# Patient Record
Sex: Male | Born: 1935 | Race: White | Hispanic: No | Marital: Married | Smoking: Former smoker
Health system: Southern US, Community
[De-identification: ages and names within clinical notes are randomized; demographics above are authoritative.]

## PROBLEM LIST (undated history)

## (undated) DIAGNOSIS — I10 Essential (primary) hypertension: Secondary | ICD-10-CM

## (undated) DIAGNOSIS — E119 Type 2 diabetes mellitus without complications: Secondary | ICD-10-CM

## (undated) DIAGNOSIS — I4891 Unspecified atrial fibrillation: Secondary | ICD-10-CM

## (undated) HISTORY — PX: LIPOMA EXCISION: SHX5283

---

## 2017-07-12 DEATH — deceased

## 2019-01-20 ENCOUNTER — Encounter (HOSPITAL_COMMUNITY): Payer: Self-pay

## 2019-01-20 ENCOUNTER — Emergency Department (HOSPITAL_COMMUNITY)
Admission: EM | Admit: 2019-01-20 | Discharge: 2019-01-20 | Disposition: A | Discharge status: Home / Self Care | Payer: Medicare HMO | Attending: Emergency Medicine | Admitting: Emergency Medicine

## 2019-01-20 ENCOUNTER — Other Ambulatory Visit: Payer: Self-pay

## 2019-01-20 ENCOUNTER — Emergency Department (HOSPITAL_COMMUNITY): Payer: Medicare HMO

## 2019-01-20 DIAGNOSIS — I10 Essential (primary) hypertension: Secondary | ICD-10-CM | POA: Insufficient documentation

## 2019-01-20 DIAGNOSIS — R079 Chest pain, unspecified: Secondary | ICD-10-CM | POA: Diagnosis present

## 2019-01-20 DIAGNOSIS — I9589 Other hypotension: Secondary | ICD-10-CM | POA: Insufficient documentation

## 2019-01-20 HISTORY — DX: Unspecified atrial fibrillation: I48.91

## 2019-01-20 HISTORY — DX: Essential (primary) hypertension: I10

## 2019-01-20 LAB — CBC
HCT: 44.6 % (ref 39.0–52.0)
Hemoglobin: 15.3 g/dL (ref 13.0–17.0)
MCH: 31 pg (ref 26.0–34.0)
MCHC: 34.3 g/dL (ref 30.0–36.0)
MCV: 90.5 fL (ref 80.0–100.0)
Platelets: 260 10*3/uL (ref 150–400)
RBC: 4.93 MIL/uL (ref 4.22–5.81)
RDW: 12.8 % (ref 11.5–15.5)
WBC: 12.4 10*3/uL — ABNORMAL HIGH (ref 4.0–10.5)
nRBC: 0 % (ref 0.0–0.2)

## 2019-01-20 LAB — BASIC METABOLIC PANEL
Anion gap: 9 (ref 5–15)
BUN: 15 mg/dL (ref 8–23)
CO2: 26 mmol/L (ref 22–32)
Calcium: 9 mg/dL (ref 8.9–10.3)
Chloride: 100 mmol/L (ref 98–111)
Creatinine, Ser: 1.27 mg/dL — ABNORMAL HIGH (ref 0.61–1.24)
GFR calc Af Amer: >60 mL/min (ref >60)
GFR calc non Af Amer: 52 mL/min — ABNORMAL LOW (ref >60)
Glucose, Bld: 226 mg/dL — ABNORMAL HIGH (ref 70–99)
Potassium: 4.5 mmol/L (ref 3.5–5.1)
Sodium: 135 mmol/L (ref 135–145)

## 2019-01-20 LAB — TROPONIN I (HIGH SENSITIVITY): Troponin I (High Sensitivity): <2 ng/L (ref <18)

## 2019-01-20 LAB — I-STAT CHEM 8, ED
BUN: 16 mg/dL (ref 8–23)
Calcium, Ion: 1.13 mmol/L — ABNORMAL LOW (ref 1.15–1.40)
Chloride: 100 mmol/L (ref 98–111)
Creatinine, Ser: 1.1 mg/dL (ref 0.61–1.24)
Glucose, Bld: 224 mg/dL — ABNORMAL HIGH (ref 70–99)
HCT: 42 % (ref 39.0–52.0)
Hemoglobin: 14.3 g/dL (ref 13.0–17.0)
Potassium: 4.4 mmol/L (ref 3.5–5.1)
Sodium: 135 mmol/L (ref 135–145)
TCO2: 25 mmol/L (ref 22–32)

## 2019-01-20 MED ORDER — SODIUM CHLORIDE 0.9 % IV BOLUS
1000.0000 mL | Freq: Once | INTRAVENOUS | Status: AC
  Administered 2019-01-20: 1000 mL via INTRAVENOUS

## 2019-01-20 MED ORDER — IOHEXOL 350 MG/ML SOLN
100.0000 mL | Freq: Once | INTRAVENOUS | Status: AC | PRN
  Administered 2019-01-20: 19:00:00 100 mL via INTRAVENOUS

## 2019-01-20 NOTE — ED Triage Notes (Signed)
Pt BIB [facility] for eval of blt rib pain onset 1400, pt describes it as a band of pain around the ribs. Pt reports 2 episodes of vomiting. Went to UC, rec'd 324 ASA there, rec'd 3 SL NTG, 168mcg fentanyl and 4 mg zofran by EMS. RBBB on 12 lead.  Reports pain is 1/10 on arrival. Pt is markedly hypertensive on arrival 239/204.

## 2019-01-20 NOTE — ED Provider Notes (Signed)
[facility] EMERGENCY DEPARTMENT Provider Note   CSN: [id number] Arrival date & time: 01/20/19  1805     History   Chief Complaint Chief Complaint  Patient presents with   Chest Pain    HPI Dennis Stephens is a 85 y.o. male presenting from urgent care for chest pain.  Please see ED course below for immediate history obtained from patient.  He was rapidly triaged back to the main ED based on stable vital signs.  I was able to ascertain he does have a history of MI and hypertension.  He states he is on Coreg.  He denies using any blood thinners but does state that he has a history of atrial fibrillation.  He reports he had chest pain that began this morning, likely insidious onset, and he feels it in the bilateral lower ribs.  He denies any pain in his back.  He denies any shortness of breath.  He denies that this feels of his prior MIs.  He was given aspirin and nitroglycerin by EMS while in transport from urgent care to our emergency department.  He was nitro extremely hypertensive in urgent care, but after the nitroglycerin on arrival here, he had a blood pressure discrepancy in your arms, with the right arm significantly elevated compared to left arm.  He told me he felt extremely tired and lightheaded.  NKDA Lives with his wife     HPI  Past Medical History:  Diagnosis Date   A-fib [facility])    Hypertension     There are no problems to display for this patient.   History reviewed. No pertinent surgical history.      Home Medications    Prior to Admission medications   Not on File    Family History History reviewed. No pertinent family history.  Social History Social History   Tobacco Use   Smoking status: Not on file  Substance Use Topics   Alcohol use: Not on file   Drug use: Not on file     Allergies   Patient has no known allergies.   Review of Systems Review of Systems  Constitutional: Positive for chills and fatigue.  Negative for fever.  Eyes: Negative for photophobia and visual disturbance.  Respiratory: Negative for cough and shortness of breath.   Cardiovascular: Negative for chest pain, palpitations and leg swelling.  Gastrointestinal: Positive for nausea. Negative for abdominal pain and vomiting.  Musculoskeletal: Negative for arthralgias and myalgias.  Skin: Positive for pallor. Negative for rash.  Neurological: Negative for syncope and light-headedness.  All other systems reviewed and are negative.    Physical Exam Updated Vital Signs BP (!) 186/72    Pulse 72    Temp 98.1 F (36.7 C) (Oral)    Resp 15    Ht 5\' 6"  (1.676 m)    Wt 95.3 kg    SpO2 96%    BMI 33.89 kg/m   Physical Exam Vitals signs and nursing note reviewed.  Constitutional:      Appearance: He is well-developed. He is ill-appearing and diaphoretic.  HENT:     Head: Normocephalic and atraumatic.  Eyes:     Conjunctiva/sclera: Conjunctivae normal.  Neck:     Musculoskeletal: Neck supple.  Cardiovascular:     Rate and Rhythm: Normal rate and regular rhythm.     Heart sounds: No murmur.     Comments: Equal pulses bilaterally Pulmonary:     Effort: Pulmonary effort is normal. No respiratory distress.  Breath sounds: Normal breath sounds.  Abdominal:     General: There is no distension or abdominal bruit.     Palpations: Abdomen is soft. There is no pulsatile mass.     Tenderness: There is no abdominal tenderness. Negative signs include Murphy's sign.  Skin:    General: Skin is warm.  Neurological:     General: No focal deficit present.     Mental Status: He is alert and oriented to person, place, and time.      ED Treatments / Results  Labs (all labs ordered are listed, but only abnormal results are displayed) Labs Reviewed  BASIC METABOLIC PANEL - Abnormal; Notable for the following components:      Result Value   Glucose, Bld 226 (*)    Creatinine, Ser 1.27 (*)    GFR calc non Af Amer 52 (*)    All  other components within normal limits  CBC - Abnormal; Notable for the following components:   WBC 12.4 (*)    All other components within normal limits  I-STAT CHEM 8, ED - Abnormal; Notable for the following components:   Glucose, Bld 224 (*)    Calcium, Ion 1.13 (*)    All other components within normal limits  TROPONIN I (HIGH SENSITIVITY)    EKG EKG Interpretation  Date/Time:  Wednesday January 20 2019 18:06:55 EST Ventricular Rate:  75 PR Interval:  172 QRS Duration: 142 QT Interval:  450 QTC Calculation: 502 R Axis:   -100 Text Interpretation: Normal sinus rhythm Right bundle branch block Inferior infarct , age undetermined Abnormal ECG No STEMI Confirmed by Octaviano Glow [id number]) on 01/20/2019 9:16:35 PM   Radiology CT Angio Chest/Abd/Pel for Dissection W and/or Wo Contrast  Result Date: 01/20/2019 CLINICAL DATA:  Chest pain. Abdominal aortic aneurysm. EXAM: CT ANGIOGRAPHY CHEST, ABDOMEN AND PELVIS TECHNIQUE: Multidetector CT imaging through the chest, abdomen and pelvis was performed using the standard protocol during bolus administration of intravenous contrast. Multiplanar reconstructed images and MIPs were obtained and reviewed to evaluate the vascular anatomy. CONTRAST:  129mL OMNIPAQUE IOHEXOL 350 MG/ML SOLN COMPARISON:  None. FINDINGS: CTA CHEST FINDINGS Cardiovascular: --Heart: The heart size is normal. There is nopericardial effusion. There are coronary artery calcifications. --Aorta: The course and caliber of the thoracic aorta are normal. There is moderate aortic atherosclerotic calcification. Precontrast images show no aortic intramural hematoma. There is no blood pool, dissection or penetrating ulcer demonstrated on arterial phase postcontrast imaging. The aortic arch is excluded from view on the postcontrast images. There is a conventional 3 vessel aortic arch branching pattern. --Pulmonary Arteries: Contrast timing is optimized for preferential opacification of the  aorta. Within that limitation, normal central pulmonary arteries. Mediastinum/Nodes: No mediastinal, hilar or axillary lymphadenopathy. The visualized thyroid and thoracic esophageal course are unremarkable. Lungs/Pleura: There is a 1.9 x 1.8 cm nodule in the left upper lobe. In the right upper lobe there is a 8 mm nodule. No pleural effusion. Musculoskeletal: No chest wall abnormality. No acute osseous findings. Review of the MIP images confirms the above findings. CTA ABDOMEN AND PELVIS FINDINGS VASCULAR Aorta: Normal caliber aorta without aneurysm, dissection, vasculitis or hemodynamically significant stenosis. There is moderate aortic atherosclerosis. Celiac: Patent without evidence of aneurysm, dissection, vasculitis or significant stenosis. SMA: Replaced right hepatic artery arises from the SMA right hepatic branch is severely stenotic at its origin (series 12, image 87. Renals: Single renal arteries bilaterally. No aneurysm, dissection, stenosis or evidence of fibromuscular dysplasia. IMA: Patent without abnormality. Inflow:  Patent without evidence of aneurysm, dissection, vasculitis or significant stenosis. There is calcific atherosclerosis. Veins: Normal course and caliber of the major veins. Assessment is otherwise limited by the arterial dominant contrast phase. Review of the MIP images confirms the above findings. NON-VASCULAR Hepatobiliary: Normal hepatic contours and density. No visible biliary dilatation. Normal gallbladder. Pancreas: Normal contours without ductal dilatation. No peripancreatic fluid collection. Spleen: Normal arterial phase splenic enhancement pattern. Adrenals/Urinary Tract: --Adrenal glands: Normal. --Right kidney/ureter: There is a 4 cm exophytic right renal cyst. No hydronephrosis. --Left kidney/ureter: No hydronephrosis or perinephric stranding. No nephrolithiasis. No obstructing ureteral stones. --Urinary bladder: Unremarkable. Stomach/Bowel: --Stomach/Duodenum: No hiatal  hernia or other gastric abnormality. Normal duodenal course and caliber. --Small bowel: No dilatation or inflammation. --Colon: Rectosigmoid diverticulosis without acute inflammation. --Appendix: Normal. Lymphatic: No abdominal or pelvic lymphadenopathy. Reproductive: Normal prostate and seminal vesicles. Musculoskeletal. No bony spinal canal stenosis or focal osseous abnormality. Other: None. Review of the MIP images confirms the above findings. IMPRESSION: 1. Coronary artery and aortic atherosclerosis (ICD10-I70.0) without acute aortic syndrome or aortic aneurysm. The aortic arch was not included in the field of view of the arterial phase images, reportedly due to scanner error. 2. Bilateral upper lobe pulmonary nodules, measuring up to 1.9 x 1.8 cm on the left and 8 mm on the right. The left nodule is particularly concerning for carcinoma. Consider one of the following in 3 months for both low-risk and high-risk individuals: (a) repeat chest CT, (b) follow-up PET-CT, or (c) tissue sampling. This recommendation follows the consensus statement: Guidelines for Management of Incidental Pulmonary Nodules Detected on CT Images: From the Fleischner Society 2017; Radiology 2017; 284:228-243. 3. Rectosigmoid diverticulosis without acute inflammation. Electronically Signed   By: Ulyses Jarred M.D.   On: 01/20/2019 19:51    Procedures Procedures (including critical care time)  Medications Ordered in ED Medications  iohexol (OMNIPAQUE) 350 MG/ML injection 100 mL (100 mLs Intravenous Contrast Given 01/20/19 1835)  sodium chloride 0.9 % bolus 1,000 mL (0 mLs Intravenous Stopped 01/20/19 2109)     Initial Impression / Assessment and Plan / ED Course  I have reviewed the triage vital signs and the nursing notes.  Pertinent labs & imaging results that were available during my care of the patient were reviewed by me and considered in my medical decision making (see chart for details).   85 yo male here with  hypotension, diaphoresis, reportedly uneven BP in bilateral arms with right arm > 786 systolic and left arm 80 systolic.  With bilateral chest pain onset today.  Most concerning for dissection vs cardiac event (MI?)   Less likely PE with no dyspnea, but remains on differential  Less likely sepsis given timeline of events and acuity  Possibly hypotensive related to nitroglycerin, will give IVF, reassess, monitor closely    Clinical Course as of Jan 21 1332  Wed Jan 20, 2019  1831 I was summoned to triage by the nursing staff with concern for possible dissection.  The patient apparently was referred to the ED from urgent care center after being found extremely hypertensive with a blood pressure of 767 systolic at the time, complaining of bilateral chest pain that began this morning.  EMS gave him 3 nitroglycerin in route and his chest pain improved from a 10 out of 10 to a 2 out of 10.  However on arrival nursing staff notes that he had unequal blood pressures, with the right arm substantially larger than the left arm.  He subsequently become  hypotensive.  Patient ports he feels extremely tired and appears pale.  It is unclear if the sudden hypotension was related to the nitroglycerin.  We brought him back urgently to the CT scanner for stat CT dissection rule out.  He is on the table for his scan now.   [MT]  4098 He reports a history of MI in the past.  He also reports a history of atrial fibrillation.  He denies any is on any blood thinning medicine.  He denies any smoking history.  He denies any known history of aneurysm.  He reports his symptoms all began today.   [MT]  1955 No acute dissection   [MT]  2000 Asymptomatic now, BP 147/80, wife at bedside.  He tells me he has had a well blood pressure swings for approximately 10 years now.  Has been on the same Coreg dose for approximately 2 years.  This is pressure may climb to 200 and then drop down to 70 randomly during the daytime.  He believes  it may be related to hydration status.   [MT]  2001 Given that there is no acute dissection, troponin is negative, I have a lower suspicion for acute coronary event.  We will give a liter of fluid and reassess in an hour.     [MT]  2130 Patient still asymptomatic and ambulating.  His blood pressure been stable in fact rising slowly over the past hour.  He is due for his evening Coreg, which I believe he can take at home.  Believe it is reasonable to discharge him at this time.  I strongly suspect that his hypotension was related to the four (4)) doses of nitroglycerin he received rapidly en route to the ED.  His BP is equal in both arms now, no deficits, it is posisble the triage BP was a discrepancy related to positioning, etc.   [MT]    Clinical Course User Index [MT] Tinia Oravec, Carola Rhine, MD    This note was dictated using dragon dictation software.  Please be aware that there may be minor translation errors as a result of this oral dictation   Final Clinical Impressions(s) / ED Diagnoses   Final diagnoses:  Chest pain, unspecified type  Other specified hypotension    ED Discharge Orders    None       Wyvonnia Dusky, MD 01/21/19 1334

## 2019-01-20 NOTE — ED Notes (Signed)
Patient verbalizes understanding of discharge instructions. Opportunity for questioning and answers were provided. Armband removed by staff, pt discharged from ED. Pt. ambulatory and discharged home.  

## 2019-01-20 NOTE — Discharge Instructions (Addendum)
You were seen in our emergency department with chest pain.  You had a work-up including blood tests and a CT scan.  I printed a copy of your blood test results and your CAT scan to bring with you to your next appointment at the [facility] hospital.  Your Covid test today was negative.  If you have any recurring symptoms, including lightheadedness, worsening chest pain, difficulty breathing, shortness of breath, you should return to the emergency department.  Please note, your blood pressure did drop while you are in the ER.  It improved on its own.  I strongly recommended that you discuss this with your primary care doctor, or whoever prescribes your carvedilol (coreg), which is a medicine that can slow your heart and drop your heart rate at times.

## 2019-04-20 ENCOUNTER — Emergency Department (HOSPITAL_COMMUNITY): Payer: No Typology Code available for payment source

## 2019-04-20 ENCOUNTER — Inpatient Hospital Stay (HOSPITAL_COMMUNITY)
Admission: EM | Admit: 2019-04-20 | Discharge: 2019-04-22 | DRG: 176 | Disposition: A | Discharge status: Home / Self Care | Payer: No Typology Code available for payment source | Attending: Internal Medicine | Admitting: Internal Medicine

## 2019-04-20 ENCOUNTER — Other Ambulatory Visit: Payer: Self-pay

## 2019-04-20 ENCOUNTER — Encounter (HOSPITAL_COMMUNITY): Payer: Self-pay | Admitting: Emergency Medicine

## 2019-04-20 DIAGNOSIS — E785 Hyperlipidemia, unspecified: Secondary | ICD-10-CM | POA: Diagnosis present

## 2019-04-20 DIAGNOSIS — Z6831 Body mass index (BMI) 31.0-31.9, adult: Secondary | ICD-10-CM

## 2019-04-20 DIAGNOSIS — C3412 Malignant neoplasm of upper lobe, left bronchus or lung: Secondary | ICD-10-CM | POA: Diagnosis present

## 2019-04-20 DIAGNOSIS — R7989 Other specified abnormal findings of blood chemistry: Secondary | ICD-10-CM | POA: Diagnosis present

## 2019-04-20 DIAGNOSIS — N400 Enlarged prostate without lower urinary tract symptoms: Secondary | ICD-10-CM | POA: Diagnosis present

## 2019-04-20 DIAGNOSIS — Z66 Do not resuscitate: Secondary | ICD-10-CM | POA: Diagnosis present

## 2019-04-20 DIAGNOSIS — E1165 Type 2 diabetes mellitus with hyperglycemia: Secondary | ICD-10-CM | POA: Diagnosis present

## 2019-04-20 DIAGNOSIS — I452 Bifascicular block: Secondary | ICD-10-CM | POA: Diagnosis present

## 2019-04-20 DIAGNOSIS — R079 Chest pain, unspecified: Secondary | ICD-10-CM

## 2019-04-20 DIAGNOSIS — N179 Acute kidney failure, unspecified: Secondary | ICD-10-CM | POA: Diagnosis present

## 2019-04-20 DIAGNOSIS — I10 Essential (primary) hypertension: Secondary | ICD-10-CM | POA: Diagnosis present

## 2019-04-20 DIAGNOSIS — R911 Solitary pulmonary nodule: Secondary | ICD-10-CM

## 2019-04-20 DIAGNOSIS — E871 Hypo-osmolality and hyponatremia: Secondary | ICD-10-CM | POA: Diagnosis not present

## 2019-04-20 DIAGNOSIS — I2699 Other pulmonary embolism without acute cor pulmonale: Secondary | ICD-10-CM | POA: Diagnosis not present

## 2019-04-20 DIAGNOSIS — C3411 Malignant neoplasm of upper lobe, right bronchus or lung: Secondary | ICD-10-CM | POA: Diagnosis present

## 2019-04-20 DIAGNOSIS — C799 Secondary malignant neoplasm of unspecified site: Secondary | ICD-10-CM

## 2019-04-20 DIAGNOSIS — C787 Secondary malignant neoplasm of liver and intrahepatic bile duct: Secondary | ICD-10-CM | POA: Diagnosis present

## 2019-04-20 DIAGNOSIS — C771 Secondary and unspecified malignant neoplasm of intrathoracic lymph nodes: Secondary | ICD-10-CM | POA: Diagnosis present

## 2019-04-20 DIAGNOSIS — N183 Chronic kidney disease, stage 3 unspecified: Secondary | ICD-10-CM | POA: Diagnosis present

## 2019-04-20 DIAGNOSIS — I129 Hypertensive chronic kidney disease with stage 1 through stage 4 chronic kidney disease, or unspecified chronic kidney disease: Secondary | ICD-10-CM | POA: Diagnosis present

## 2019-04-20 DIAGNOSIS — I4891 Unspecified atrial fibrillation: Secondary | ICD-10-CM | POA: Diagnosis present

## 2019-04-20 DIAGNOSIS — E669 Obesity, unspecified: Secondary | ICD-10-CM | POA: Diagnosis present

## 2019-04-20 DIAGNOSIS — Z20822 Contact with and (suspected) exposure to covid-19: Secondary | ICD-10-CM | POA: Diagnosis present

## 2019-04-20 DIAGNOSIS — R918 Other nonspecific abnormal finding of lung field: Secondary | ICD-10-CM | POA: Diagnosis present

## 2019-04-20 DIAGNOSIS — Z87891 Personal history of nicotine dependence: Secondary | ICD-10-CM

## 2019-04-20 DIAGNOSIS — E1122 Type 2 diabetes mellitus with diabetic chronic kidney disease: Secondary | ICD-10-CM | POA: Diagnosis present

## 2019-04-20 LAB — COMPREHENSIVE METABOLIC PANEL
ALT: 155 U/L — ABNORMAL HIGH (ref 0–44)
AST: 216 U/L — ABNORMAL HIGH (ref 15–41)
Albumin: 3.7 g/dL (ref 3.5–5.0)
Alkaline Phosphatase: 468 U/L — ABNORMAL HIGH (ref 38–126)
Anion gap: 14 (ref 5–15)
BUN: 20 mg/dL (ref 8–23)
CO2: 22 mmol/L (ref 22–32)
Calcium: 9.3 mg/dL (ref 8.9–10.3)
Chloride: 95 mmol/L — ABNORMAL LOW (ref 98–111)
Creatinine, Ser: 1.34 mg/dL — ABNORMAL HIGH (ref 0.61–1.24)
GFR calc Af Amer: 56 mL/min — ABNORMAL LOW (ref >60)
GFR calc non Af Amer: 49 mL/min — ABNORMAL LOW (ref >60)
Glucose, Bld: 357 mg/dL — ABNORMAL HIGH (ref 70–99)
Potassium: 4.4 mmol/L (ref 3.5–5.1)
Sodium: 131 mmol/L — ABNORMAL LOW (ref 135–145)
Total Bilirubin: 4.3 mg/dL — ABNORMAL HIGH (ref 0.3–1.2)
Total Protein: 7.6 g/dL (ref 6.5–8.1)

## 2019-04-20 LAB — LIPASE, BLOOD: Lipase: 39 U/L (ref 11–51)

## 2019-04-20 LAB — CBC
HCT: 46.3 % (ref 39.0–52.0)
Hemoglobin: 15.3 g/dL (ref 13.0–17.0)
MCH: 30.8 pg (ref 26.0–34.0)
MCHC: 33 g/dL (ref 30.0–36.0)
MCV: 93.3 fL (ref 80.0–100.0)
Platelets: 287 10*3/uL (ref 150–400)
RBC: 4.96 MIL/uL (ref 4.22–5.81)
RDW: 13.6 % (ref 11.5–15.5)
WBC: 14.9 10*3/uL — ABNORMAL HIGH (ref 4.0–10.5)
nRBC: 0 % (ref 0.0–0.2)

## 2019-04-20 LAB — TROPONIN I (HIGH SENSITIVITY): Troponin I (High Sensitivity): 5 ng/L (ref <18)

## 2019-04-20 MED ORDER — SODIUM CHLORIDE (PF) 0.9 % IJ SOLN
INTRAMUSCULAR | Status: AC
  Filled 2019-04-20: qty 50

## 2019-04-20 MED ORDER — IOHEXOL 350 MG/ML SOLN
100.0000 mL | Freq: Once | INTRAVENOUS | Status: AC | PRN
  Administered 2019-04-20: 100 mL via INTRAVENOUS

## 2019-04-20 MED ORDER — MORPHINE SULFATE (PF) 4 MG/ML IV SOLN
4.0000 mg | Freq: Once | INTRAVENOUS | Status: AC
  Administered 2019-04-20: 4 mg via INTRAVENOUS
  Filled 2019-04-20: qty 1

## 2019-04-20 MED ORDER — ONDANSETRON HCL 4 MG/2ML IJ SOLN
4.0000 mg | Freq: Once | INTRAMUSCULAR | Status: AC
  Administered 2019-04-20: 4 mg via INTRAVENOUS
  Filled 2019-04-20: qty 2

## 2019-04-20 MED ORDER — SODIUM CHLORIDE 0.9% FLUSH: 3.0000 mL | Freq: Once | INTRAVENOUS | Status: DC

## 2019-04-20 MED ORDER — SODIUM CHLORIDE 0.9 % IV BOLUS
1000.0000 mL | Freq: Once | INTRAVENOUS | Status: AC
  Administered 2019-04-20: 1000 mL via INTRAVENOUS

## 2019-04-20 NOTE — ED Triage Notes (Signed)
Patient is complaining of chest pain. Patient states it started 3pm. Patient states that he has no other symptoms.

## 2019-04-20 NOTE — ED Provider Notes (Signed)
[name]   CSN: [id number] Arrival date & time: 04/20/19  2116     History Chief Complaint  Patient presents with  . Chest Pain    Dennis Stephens is a 86 y.o. male with PMH/ Afib (not on anticoagulation), HTN who presents for evaluation of chest pain that began about 3-4 PM.  He reports that he was sitting watching TV when the chest pain started.  He describes it as a pressure and a tightness across his anterior chest.  It does not radiate anywhere.  He states it is not worse with deep inspiration.  He does not recall if it is worse with exertion.  He states initially, he did not get diaphoretic or nausea/vomiting but when he got to the waiting room, he started getting diaphoretic and had episodes of vomiting.  Patient also reports he has some upper abdominal pain.  He states that he has had some trouble breathing and some coughing which he states is been an ongoing issue for the last several months.  He saw his primary care doctor today for evaluation of previously seen pulmonary nodules on a CT scan as well as blood pressure.  He states that despite taking his medications, his blood pressure still elevated with systolic blood pressure in the 190s.  He states that his doctor called him today and was told that he would need to come back in next week.  He states that there was some suspicious findings on the CT scan but does not know specifically what that is.  He states that he does not smoke.  He does have a history of high blood pressure.  No history of diabetes.  History of A. fib but not currently on anticoagulation.  He tells me he had an MI about a year and a half ago down in [place] where he had a catheterization.  He does not think he had any stents.  He states he had followed up with cardiology in [place] but has not followed up with anybody here.  He currently sees the [place] for his primary care.  He denies any fevers, urinary complaints,  numbness/weakness of his extremities.  The history is provided by the patient.       Past Medical History:  Diagnosis Date  . A-fib ([place])   . Hypertension     Patient Active Problem List   Diagnosis Date Noted  . Pulmonary embolism ([place]) 04/21/2019    History reviewed. No pertinent surgical history.     Family History  Problem Relation Age of Onset  . Diabetes Mellitus II Neg Hx     Social History   Tobacco Use  . Smoking status: Former Research scientist (life sciences)  . Smokeless tobacco: Never Used  Substance Use Topics  . Alcohol use: Not Currently  . Drug use: Not Currently    Home Medications Prior to Admission medications   Medication Sig Start Date End Date Taking? Authorizing Provider  amLODipine (NORVASC) 2.5 MG tablet Take 2.5 mg by mouth daily.   Yes [provider]  atorvastatin (LIPITOR) 40 MG tablet Take 40 mg by mouth daily.   Yes [provider]  carvedilol (COREG) 12.5 MG tablet Take 12.5 mg by mouth 2 (two) times daily with a meal.   Yes [provider]  doxazosin (CARDURA) 8 MG tablet Take 8 mg by mouth daily.   Yes [provider]  losartan (COZAAR) 100 MG tablet Take 100 mg by mouth daily.   Yes  [provider]  omeprazole (PRILOSEC) 20 MG capsule Take 20 mg by mouth daily.   Yes [provider]  primidone (MYSOLINE) 50 MG tablet Take 100 mg by mouth every morning.   Yes [provider]  sertraline (ZOLOFT) 100 MG tablet Take 100 mg by mouth daily.   Yes [provider]  temazepam (RESTORIL) 30 MG capsule Take 30 mg by mouth at bedtime as needed for sleep.   Yes [provider]    Allergies    Patient has no known allergies.  Review of Systems   Review of Systems  Constitutional: Negative for fever.  Respiratory: Positive for cough and shortness of breath.   Cardiovascular: Positive for chest pain.  Gastrointestinal: Positive for abdominal pain and vomiting. Negative for nausea.    Genitourinary: Negative for dysuria and hematuria.  Neurological: Negative for headaches.  All other systems reviewed and are negative.   Physical Exam Updated Vital Signs BP (!) 164/62   Pulse (!) 101   Temp 98.1 F (36.7 C) (Oral)   Resp (!) 25   Ht '5\' 9"'$  (1.753 m)   Wt 96.6 kg   SpO2 90%   BMI 31.45 kg/m   Physical Exam Vitals and nursing note reviewed.  Constitutional:      Appearance: Normal appearance.     Comments: Appears uncomfortable  HENT:     Head: Normocephalic and atraumatic.  Eyes:     General: Lids are normal.     Conjunctiva/sclera: Conjunctivae normal.     Pupils: Pupils are equal, round, and reactive to light.  Cardiovascular:     Rate and Rhythm: Normal rate and regular rhythm.     Pulses: Normal pulses.          Radial pulses are 2+ on the right side and 2+ on the left side.       Dorsalis pedis pulses are 2+ on the right side and 2+ on the left side.     Heart sounds: Normal heart sounds. No murmur. No friction rub. No gallop.   Pulmonary:     Effort: Pulmonary effort is normal. Tachypnea present.     Breath sounds: Normal breath sounds.     Comments: Slight tachypnea noted. No evidence of respiratory distress.  Mild rales noted to bilateral bases. Abdominal:     Palpations: Abdomen is soft. Abdomen is not rigid.     Tenderness: There is abdominal tenderness in the epigastric area. There is no guarding.     Comments: Abdomen is soft.  Tenderness noted to epigastric region.  Some voluntary guarding noted.  No rigidity.  Musculoskeletal:        General: Normal range of motion.     Cervical back: Full passive range of motion without pain.  Skin:    General: Skin is warm and dry.     Capillary Refill: Capillary refill takes less than 2 seconds.  Neurological:     Mental Status: He is alert and oriented to person, place, and time.  Psychiatric:        Speech: Speech normal.     ED Results / Procedures / Treatments   Labs (all labs ordered  are listed, but only abnormal results are displayed) Labs Reviewed  CBC - Abnormal; Notable for the following components:      Result Value   WBC 14.9 (*)    All other components within normal limits  COMPREHENSIVE METABOLIC PANEL - Abnormal; Notable for the following components:   Sodium 131 (*)  Chloride 95 (*)    Glucose, Bld 357 (*)    Creatinine, Ser 1.34 (*)    AST 216 (*)    ALT 155 (*)    Alkaline Phosphatase 468 (*)    Total Bilirubin 4.3 (*)    GFR calc non Af Amer 49 (*)    GFR calc Af Amer 56 (*)    All other components within normal limits  LIPASE, BLOOD  CBC  HEPARIN LEVEL (UNFRACTIONATED)  POC SARS CORONAVIRUS 2 AG -  ED  TROPONIN I (HIGH SENSITIVITY)  TROPONIN I (HIGH SENSITIVITY)    EKG EKG Interpretation  Date/Time:  Tuesday April 20 2019 21:28:39 EST Ventricular Rate:  82 PR Interval:    QRS Duration: 162 QT Interval:  447 QTC Calculation: 506 R Axis:   -98 Text Interpretation: Sinus rhythm Multiple ventricular premature complexes Consider left atrial enlargement Right bundle branch block No significant change was found Confirmed by Gerlene Fee [phone]) on 04/20/2019 10:09:38 PM   Radiology DG Chest 2 View  Result Date: 04/20/2019 CLINICAL DATA:  Chest pain for several hours, history of lung carcinoma EXAM: CHEST - 2 VIEW COMPARISON:  01/20/2019 CT FINDINGS: Cardiac shadow is within normal limits. There is a rounded nodular density identified in the left lung measuring 3.3 cm which corresponds to that seen on the prior exam at which time it measured 1.8 cm. Patchy changes are noted in the right upper lobe also increased when compared with the prior exam. No sizable effusion is noted. No bony abnormality is noted. IMPRESSION: Enlarging bilateral upper lobe nodules as described. This is consistent with the patient's given clinical history Electronically Signed   By: Inez Catalina M.D.   On: 04/20/2019 21:56   CT Head Wo Contrast  Result Date:  04/21/2019 CLINICAL DATA:  86 year old male with pulmonary emboli, malignant appearing mediastinal lymphadenopathy and liver lesion on CT yesterday. Query brain Mets. EXAM: CT HEAD WITHOUT CONTRAST TECHNIQUE: Contiguous axial images were obtained from the base of the skull through the vertex without intravenous contrast. COMPARISON:  No prior head imaging. FINDINGS: Brain: IV contrast was administered for chest abdomen and pelvis CT 1 hour before these images. Patchy bilateral white matter hypodensity, but no areas suspicious for vasogenic edema. No midline shift, mass effect, or evidence of intracranial mass lesion. No abnormal enhancement identified. No ventriculomegaly. No cortical encephalomalacia identified. No cortically based acute infarct identified. There is a small hypodensity in the posterior right cerebellum on series 2, image 7, probably a small chronic cerebellar infarct. Vascular: Some residual intravascular contrast is present as above. The major intracranial vascular structures seem to be enhancing as expected. Skull: No acute or suspicious osseous lesion identified. Sinuses/Orbits: Visualized paranasal sinuses and mastoids are clear. Other: No acute orbit or scalp soft tissue findings. IMPRESSION: 1. No metastatic disease to the brain is identified by CT with very delayed contrast timing. MRI Head without and with contrast would be most sensitive. 2. No acute intracranial abnormality identified. 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 Electronically Signed   By: Genevie Ann M.D.   On: 04/21/2019 01:08   CT Angio Chest PE W and/or Wo Contrast  Result Date: 04/21/2019 CLINICAL DATA:  Non-specific chest pain and abdominal pain since 1500 hours. History of hypertension and atrial fibrillation. EXAM: CT ANGIOGRAPHY CHEST CT ABDOMEN AND PELVIS WITH CONTRAST TECHNIQUE: Multidetector CT imaging of the chest was performed using the  standard protocol during bolus administration  of intravenous contrast. Multiplanar CT image reconstructions and MIPs were obtained to evaluate the vascular anatomy. Multidetector CT imaging of the abdomen and pelvis was performed using the standard protocol during bolus administration of intravenous contrast. Multidetector CT imaging of the chest was performed using the standard protocol during bolus administration of intravenous contrast. Multiplanar CT image reconstructions and MIPs were obtained to evaluate the vascular anatomy. Automatic exposure control utilized. CONTRAST:  149m OMNIPAQUE IOHEXOL 350 MG/ML SOLN COMPARISON:  January 20, 2019. FINDINGS: CTA CHEST FINDINGS Cardiovascular: Pulmonary emboli in the bilateral proximal lobar pulmonary arteries and more peripheral bilateral pulmonary arteries. No central pulmonary artery embolism. Right ventricular to left ventricular ratio 0.7; no right heart strain. Mild cardiomegaly with fatty hypertrophy of the inter atrial septum and four-vessel moderate to severe coronary calcification. Mediastinum/Nodes: Metastatic appearing right paratracheal conglomerate adenopathy measuring 3.2 by 2.4 cm. Metastatic-appearing superior mediastinal lymph node measuring 23 by 16 mm. Metastatic appearing right hilar lymph node measuring 16 by 22 mm. Additional smaller mediastinal and bilateral hilar lymph nodes that could be metastatic or benign. Small hiatal hernia. Lungs/Pleura: No pleural effusion. Bilateral spiculated upper lobe pulmonary nodules measuring 2.4 cm diameter in the left upper lobe on series 2, image 39, and 2.6 x 2.0 cm in the right upper lobe on image 26. Both nodules have increased in size is since December 2020 at which time they measured 19 x 18 mm on the left, and 8 mm diameter on the right. Respiratory motion artifact Upper Abdomen: Reported on concomitant CT abdomen and pelvis reported above. Musculoskeletal: Bone demineralization. Moderate skeletal  degenerative change. A 6 mm sclerotic lesion in the T12 vertebral body stable compared to December 2020. Review of the MIP images confirms the above findings. CT ABDOMEN and PELVIS FINDINGS Hepatobiliary: A new 14 mm hypoattenuating lesion in the right hemi liver, highly worrisome for metastasis. Chronic mild intrahepatic and 14 mm proximal common bile duct dilatation with normal distal tapering. Normal gallbladder. Pancreas: Normal. Spleen: Normal. Adrenals/Urinary Tract: Normal bilateral adrenal glands. Bilateral medical renal disease and simple appearing renal cortical cysts measuring up to 4.4 cm on the right. No hydronephrosis or nephroureterolithiasis bilaterally. No apparent urinary bladder abnormality. A dense 10 mm right renal midpole hypoattenuation. Stomach/Bowel: No bowel obstruction. Moderate stool burden and diverticulosis coli without an acute diverticulitis or apparent bowel wall thickening. Normal appendix, axial image 60. Vascular/Lymphatic: Aortic bilateral iliac in major abdominal artery calcified atherosclerosis with probably clinically significant 75% narrowing of the left common iliac artery. No abdominal aortic aneurysm. Small nonspecific retrocrural, gastrohepatic ligament and retroperitoneal lymph nodes. A mildly enlarged periportal lymph node measuring 12 x 16 mm. No abdominal aortic dissection. Reproductive: Mild prostatomegaly. Other: None. Musculoskeletal: Moderate skeletal degenerative change. A 9 mm ovoid sclerotic lesion in the right proximal femoral shaft, unchanged. Review of the MIP images confirms the above findings. I discussed critical Value/emergent results by telephone at the time of interpretation on 04/21/2019 at 12:12 am with provider LProvidence Lanius, who verbally acknowledged these results. IMPRESSION: Acute bilateral lobar and peripheral pulmonary emboli without right heart strain. No thoracoabdominal aortic dissection. Suspicious bilateral upper lobe solid pulmonary  nodules, both increased in size since December 2020. Histopathological correlation recommended. An embolic or infectious etiology are less likely differential considerations. Malignant-appearing mediastinal and right hilar adenopathy. Enlarged nonspecific gastrohepatic node. Additional small nonspecific lymph nodes above and below diaphragm which could be metastatic or benign. New hepatic lesion concerning for metastatic disease. Normal bilateral adrenal glands. A new 10 mm  dense right renal midpole lesion; renal ultrasonography recommended. A proteinaceous or hemorrhagic cortical cyst or malignant renal lesion are differential considerations. Mild cardiomegaly with four-vessel moderate to severe coronary calcification. Small hiatal hernia. Stable 6 mm T12 and 9 mm right proximal femoral shaft sclerotic lesions since December 2020. Thoracoabdominal aorta and major abdominal artery calcified atherosclerosis without aneurysm. Probably clinically significant 75% atherosclerotic narrowing of the right common iliac artery. Diverticulosis coli. Chronic biliary dilatation. Electronically Signed   By: Revonda Humphrey   On: 04/21/2019 00:17   CT ABDOMEN PELVIS W CONTRAST  Result Date: 04/21/2019 CLINICAL DATA:  Non-specific chest pain and abdominal pain since 1500 hours. History of hypertension and atrial fibrillation. EXAM: CT ANGIOGRAPHY CHEST CT ABDOMEN AND PELVIS WITH CONTRAST TECHNIQUE: Multidetector CT imaging of the chest was performed using the standard protocol during bolus administration of intravenous contrast. Multiplanar CT image reconstructions and MIPs were obtained to evaluate the vascular anatomy. Multidetector CT imaging of the abdomen and pelvis was performed using the standard protocol during bolus administration of intravenous contrast. Multidetector CT imaging of the chest was performed using the standard protocol during bolus administration of intravenous contrast. Multiplanar CT image  reconstructions and MIPs were obtained to evaluate the vascular anatomy. Automatic exposure control utilized. CONTRAST:  157m OMNIPAQUE IOHEXOL 350 MG/ML SOLN COMPARISON:  January 20, 2019. FINDINGS: CTA CHEST FINDINGS Cardiovascular: Pulmonary emboli in the bilateral proximal lobar pulmonary arteries and more peripheral bilateral pulmonary arteries. No central pulmonary artery embolism. Right ventricular to left ventricular ratio 0.7; no right heart strain. Mild cardiomegaly with fatty hypertrophy of the inter atrial septum and four-vessel moderate to severe coronary calcification. Mediastinum/Nodes: Metastatic appearing right paratracheal conglomerate adenopathy measuring 3.2 by 2.4 cm. Metastatic-appearing superior mediastinal lymph node measuring 23 by 16 mm. Metastatic appearing right hilar lymph node measuring 16 by 22 mm. Additional smaller mediastinal and bilateral hilar lymph nodes that could be metastatic or benign. Small hiatal hernia. Lungs/Pleura: No pleural effusion. Bilateral spiculated upper lobe pulmonary nodules measuring 2.4 cm diameter in the left upper lobe on series 2, image 39, and 2.6 x 2.0 cm in the right upper lobe on image 26. Both nodules have increased in size is since December 2020 at which time they measured 19 x 18 mm on the left, and 8 mm diameter on the right. Respiratory motion artifact Upper Abdomen: Reported on concomitant CT abdomen and pelvis reported above. Musculoskeletal: Bone demineralization. Moderate skeletal degenerative change. A 6 mm sclerotic lesion in the T12 vertebral body stable compared to December 2020. Review of the MIP images confirms the above findings. CT ABDOMEN and PELVIS FINDINGS Hepatobiliary: A new 14 mm hypoattenuating lesion in the right hemi liver, highly worrisome for metastasis. Chronic mild intrahepatic and 14 mm proximal common bile duct dilatation with normal distal tapering. Normal gallbladder. Pancreas: Normal. Spleen: Normal. Adrenals/Urinary  Tract: Normal bilateral adrenal glands. Bilateral medical renal disease and simple appearing renal cortical cysts measuring up to 4.4 cm on the right. No hydronephrosis or nephroureterolithiasis bilaterally. No apparent urinary bladder abnormality. A dense 10 mm right renal midpole hypoattenuation. Stomach/Bowel: No bowel obstruction. Moderate stool burden and diverticulosis coli without an acute diverticulitis or apparent bowel wall thickening. Normal appendix, axial image 60. Vascular/Lymphatic: Aortic bilateral iliac in major abdominal artery calcified atherosclerosis with probably clinically significant 75% narrowing of the left common iliac artery. No abdominal aortic aneurysm. Small nonspecific retrocrural, gastrohepatic ligament and retroperitoneal lymph nodes. A mildly enlarged periportal lymph node measuring 12 x 16 mm. No abdominal  aortic dissection. Reproductive: Mild prostatomegaly. Other: None. Musculoskeletal: Moderate skeletal degenerative change. A 9 mm ovoid sclerotic lesion in the right proximal femoral shaft, unchanged. Review of the MIP images confirms the above findings. I discussed critical Value/emergent results by telephone at the time of interpretation on 04/21/2019 at 12:12 am with provider Providence Lanius , who verbally acknowledged these results. IMPRESSION: Acute bilateral lobar and peripheral pulmonary emboli without right heart strain. No thoracoabdominal aortic dissection. Suspicious bilateral upper lobe solid pulmonary nodules, both increased in size since December 2020. Histopathological correlation recommended. An embolic or infectious etiology are less likely differential considerations. Malignant-appearing mediastinal and right hilar adenopathy. Enlarged nonspecific gastrohepatic node. Additional small nonspecific lymph nodes above and below diaphragm which could be metastatic or benign. New hepatic lesion concerning for metastatic disease. Normal bilateral adrenal glands. A new 10  mm dense right renal midpole lesion; renal ultrasonography recommended. A proteinaceous or hemorrhagic cortical cyst or malignant renal lesion are differential considerations. Mild cardiomegaly with four-vessel moderate to severe coronary calcification. Small hiatal hernia. Stable 6 mm T12 and 9 mm right proximal femoral shaft sclerotic lesions since December 2020. Thoracoabdominal aorta and major abdominal artery calcified atherosclerosis without aneurysm. Probably clinically significant 75% atherosclerotic narrowing of the right common iliac artery. Diverticulosis coli. Chronic biliary dilatation. Electronically Signed   By: Revonda Humphrey   On: 04/21/2019 00:17    Procedures .Critical Care Performed by: Volanda Napoleon, PA-C Authorized by: Volanda Napoleon, PA-C   Critical care provider statement:    Critical care time (minutes):  40   Critical care was necessary to treat or prevent imminent or life-threatening deterioration of the following conditions:  Circulatory failure and respiratory failure (PE)   Critical care was time spent personally by me on the following activities:  Discussions with consultants, evaluation of patient's response to treatment, examination of patient, ordering and performing treatments and interventions, ordering and review of laboratory studies, ordering and review of radiographic studies, pulse oximetry, re-evaluation of patient's condition, obtaining history from patient or surrogate and review of old charts   (including critical care time)  Medications Ordered in ED Medications  sodium chloride flush (NS) 0.9 % injection 3 mL (3 mLs Intravenous Not Given 04/20/19 2248)  sodium chloride (PF) 0.9 % injection (has no administration in time range)  heparin ADULT infusion 100 units/mL (25000 units/280m sodium chloride 0.45%) (1,350 Units/hr Intravenous New Bag/Given 04/21/19 0216)  sodium chloride 0.9 % bolus 1,000 mL (0 mLs Intravenous Stopped 04/21/19 0012)   ondansetron (ZOFRAN) injection 4 mg (4 mg Intravenous Given 04/20/19 2247)  morphine 4 MG/ML injection 4 mg (4 mg Intravenous Given 04/20/19 2247)  iohexol (OMNIPAQUE) 350 MG/ML injection 100 mL (100 mLs Intravenous Contrast Given 04/20/19 2334)  morphine 4 MG/ML injection 4 mg (4 mg Intravenous Given 04/21/19 0021)  heparin injection 4,000 Units (4,000 Units Intravenous Given 04/21/19 0212)    ED Course  I have reviewed the triage vital signs and the nursing notes.  Pertinent labs & imaging results that were available during my care of the patient were reviewed by me and considered in my medical decision making (see chart for details).    MDM Rules/Calculators/A&P                      86year old male who presents for evaluation of chest pain that began this evening.  Also started having diaphoresis, vomiting in the waiting room.  On initial ED arrival, he was afebrile, slightly low soft  blood pressures but vitals otherwise stable.  On my initial evaluation, patient was actively vomiting, diaphoretic. His O2 sats are going between 88-89% on RA. He will then occasionally go back up to 91%. Repeat EKG was done.  Concern for ACS etiology versus PE given history of A. fib with no anticoagulation.  Also concern for intra-abdominal pathology.  Plan to check labs, chest x-ray.  CBC shows leukocytosis of 14.9.  Initial troponin negative.  Lipase is unremarkable.  CMP shows sodium 131, glucose of 357, BUN 20, creatinine 1.34.  He does have elevations of his AST/ALT and alk phos.  No priors for comparison.  Chest CT shows acute bilateral lobar and peripheral pulmonary emboli without right heart strain.  Evidence of dissection.  He also has note of bilateral upper pulmonary nodules that have increased in size since December 2020.  He also has mediastinal and right hilar adenopathy.  There is a new hepatic lesion noted that is concerning for metastatic disease.  Plan for head CT to ensure there is no mets to  brain.  Head CT negative for any acute abnormalities.  Given evidence of PE, will plan to start patient on heparin.  He will require admission.  I discussed with Dr. Hal Hope (hospitalist) who accepts patient for admission.   Portions of this note were generated with [employer]. Dictation errors may occur despite best attempts at proofreading.   Final Clinical Impression(s) / ED Diagnoses Final diagnoses:  Other acute pulmonary embolism, unspecified whether acute cor pulmonale present [facility])  Chest pain, unspecified type  Pulmonary nodule    Rx / DC Orders ED Discharge Orders    None       Volanda Napoleon, PA-C 04/21/19 0334    Maudie Flakes, MD 04/22/19 1501

## 2019-04-21 ENCOUNTER — Encounter (HOSPITAL_COMMUNITY): Payer: Self-pay | Admitting: Internal Medicine

## 2019-04-21 ENCOUNTER — Observation Stay (HOSPITAL_BASED_OUTPATIENT_CLINIC_OR_DEPARTMENT_OTHER): Payer: No Typology Code available for payment source

## 2019-04-21 ENCOUNTER — Emergency Department (HOSPITAL_COMMUNITY): Payer: No Typology Code available for payment source

## 2019-04-21 ENCOUNTER — Observation Stay (HOSPITAL_COMMUNITY): Payer: No Typology Code available for payment source

## 2019-04-21 DIAGNOSIS — N400 Enlarged prostate without lower urinary tract symptoms: Secondary | ICD-10-CM | POA: Diagnosis present

## 2019-04-21 DIAGNOSIS — Z20822 Contact with and (suspected) exposure to covid-19: Secondary | ICD-10-CM | POA: Diagnosis present

## 2019-04-21 DIAGNOSIS — R7989 Other specified abnormal findings of blood chemistry: Secondary | ICD-10-CM | POA: Diagnosis not present

## 2019-04-21 DIAGNOSIS — N183 Chronic kidney disease, stage 3 unspecified: Secondary | ICD-10-CM | POA: Diagnosis present

## 2019-04-21 DIAGNOSIS — E669 Obesity, unspecified: Secondary | ICD-10-CM | POA: Diagnosis present

## 2019-04-21 DIAGNOSIS — I129 Hypertensive chronic kidney disease with stage 1 through stage 4 chronic kidney disease, or unspecified chronic kidney disease: Secondary | ICD-10-CM | POA: Diagnosis present

## 2019-04-21 DIAGNOSIS — R918 Other nonspecific abnormal finding of lung field: Secondary | ICD-10-CM | POA: Diagnosis present

## 2019-04-21 DIAGNOSIS — C771 Secondary and unspecified malignant neoplasm of intrathoracic lymph nodes: Secondary | ICD-10-CM | POA: Diagnosis present

## 2019-04-21 DIAGNOSIS — C3412 Malignant neoplasm of upper lobe, left bronchus or lung: Secondary | ICD-10-CM | POA: Diagnosis present

## 2019-04-21 DIAGNOSIS — K859 Acute pancreatitis without necrosis or infection, unspecified: Secondary | ICD-10-CM | POA: Diagnosis not present

## 2019-04-21 DIAGNOSIS — E871 Hypo-osmolality and hyponatremia: Secondary | ICD-10-CM | POA: Diagnosis not present

## 2019-04-21 DIAGNOSIS — E785 Hyperlipidemia, unspecified: Secondary | ICD-10-CM | POA: Diagnosis present

## 2019-04-21 DIAGNOSIS — N179 Acute kidney failure, unspecified: Secondary | ICD-10-CM | POA: Diagnosis not present

## 2019-04-21 DIAGNOSIS — I2699 Other pulmonary embolism without acute cor pulmonale: Secondary | ICD-10-CM | POA: Diagnosis present

## 2019-04-21 DIAGNOSIS — C787 Secondary malignant neoplasm of liver and intrahepatic bile duct: Secondary | ICD-10-CM | POA: Diagnosis present

## 2019-04-21 DIAGNOSIS — R079 Chest pain, unspecified: Secondary | ICD-10-CM

## 2019-04-21 DIAGNOSIS — E1122 Type 2 diabetes mellitus with diabetic chronic kidney disease: Secondary | ICD-10-CM | POA: Diagnosis present

## 2019-04-21 DIAGNOSIS — I10 Essential (primary) hypertension: Secondary | ICD-10-CM

## 2019-04-21 DIAGNOSIS — Z6831 Body mass index (BMI) 31.0-31.9, adult: Secondary | ICD-10-CM | POA: Diagnosis not present

## 2019-04-21 DIAGNOSIS — C3411 Malignant neoplasm of upper lobe, right bronchus or lung: Secondary | ICD-10-CM | POA: Diagnosis present

## 2019-04-21 DIAGNOSIS — E1165 Type 2 diabetes mellitus with hyperglycemia: Secondary | ICD-10-CM | POA: Diagnosis present

## 2019-04-21 DIAGNOSIS — Z87891 Personal history of nicotine dependence: Secondary | ICD-10-CM | POA: Diagnosis not present

## 2019-04-21 DIAGNOSIS — I452 Bifascicular block: Secondary | ICD-10-CM | POA: Diagnosis present

## 2019-04-21 DIAGNOSIS — R590 Localized enlarged lymph nodes: Secondary | ICD-10-CM | POA: Diagnosis not present

## 2019-04-21 DIAGNOSIS — Z66 Do not resuscitate: Secondary | ICD-10-CM | POA: Diagnosis present

## 2019-04-21 DIAGNOSIS — I4891 Unspecified atrial fibrillation: Secondary | ICD-10-CM | POA: Diagnosis present

## 2019-04-21 LAB — BASIC METABOLIC PANEL
Anion gap: 14 (ref 5–15)
BUN: 17 mg/dL (ref 8–23)
CO2: 21 mmol/L — ABNORMAL LOW (ref 22–32)
Calcium: 9.1 mg/dL (ref 8.9–10.3)
Chloride: 94 mmol/L — ABNORMAL LOW (ref 98–111)
Creatinine, Ser: 1.37 mg/dL — ABNORMAL HIGH (ref 0.61–1.24)
GFR calc Af Amer: 55 mL/min — ABNORMAL LOW (ref >60)
GFR calc non Af Amer: 47 mL/min — ABNORMAL LOW (ref >60)
Glucose, Bld: 433 mg/dL — ABNORMAL HIGH (ref 70–99)
Potassium: 4.5 mmol/L (ref 3.5–5.1)
Sodium: 129 mmol/L — ABNORMAL LOW (ref 135–145)

## 2019-04-21 LAB — URINALYSIS, ROUTINE W REFLEX MICROSCOPIC
Bilirubin Urine: NEGATIVE
Glucose, UA: >=500 mg/dL — AB
Hgb urine dipstick: NEGATIVE
Ketones, ur: NEGATIVE mg/dL
Leukocytes,Ua: NEGATIVE
Nitrite: NEGATIVE
Protein, ur: NEGATIVE mg/dL
Specific Gravity, Urine: 1.035 — ABNORMAL HIGH (ref 1.005–1.030)
pH: 5 (ref 5.0–8.0)

## 2019-04-21 LAB — TROPONIN I (HIGH SENSITIVITY)
Troponin I (High Sensitivity): 5 ng/L (ref <18)
Troponin I (High Sensitivity): 10 ng/L (ref <18)
Troponin I (High Sensitivity): 11 ng/L (ref <18)

## 2019-04-21 LAB — CBC
HCT: 42.4 % (ref 39.0–52.0)
Hemoglobin: 14.5 g/dL (ref 13.0–17.0)
MCH: 31.5 pg (ref 26.0–34.0)
MCHC: 34.2 g/dL (ref 30.0–36.0)
MCV: 92 fL (ref 80.0–100.0)
Platelets: 313 10*3/uL (ref 150–400)
RBC: 4.61 MIL/uL (ref 4.22–5.81)
RDW: 13.6 % (ref 11.5–15.5)
WBC: 18.1 10*3/uL — ABNORMAL HIGH (ref 4.0–10.5)
nRBC: 0 % (ref 0.0–0.2)

## 2019-04-21 LAB — ECHOCARDIOGRAM COMPLETE
Height: 69 in
Weight: 3408 oz

## 2019-04-21 LAB — HEPATITIS PANEL, ACUTE
HCV Ab: NONREACTIVE
Hep A IgM: NONREACTIVE
Hep B C IgM: NONREACTIVE
Hepatitis B Surface Ag: NONREACTIVE

## 2019-04-21 LAB — HEPATIC FUNCTION PANEL
ALT: 223 U/L — ABNORMAL HIGH (ref 0–44)
AST: 326 U/L — ABNORMAL HIGH (ref 15–41)
Albumin: 3.4 g/dL — ABNORMAL LOW (ref 3.5–5.0)
Alkaline Phosphatase: 472 U/L — ABNORMAL HIGH (ref 38–126)
Bilirubin, Direct: 4.7 mg/dL — ABNORMAL HIGH (ref 0.0–0.2)
Indirect Bilirubin: 2.1 mg/dL — ABNORMAL HIGH (ref 0.3–0.9)
Total Bilirubin: 6.8 mg/dL — ABNORMAL HIGH (ref 0.3–1.2)
Total Protein: 7.4 g/dL (ref 6.5–8.1)

## 2019-04-21 LAB — GLUCOSE, CAPILLARY
Glucose-Capillary: 197 mg/dL — ABNORMAL HIGH (ref 70–99)
Glucose-Capillary: 262 mg/dL — ABNORMAL HIGH (ref 70–99)

## 2019-04-21 LAB — HEPARIN LEVEL (UNFRACTIONATED)
Heparin Unfractionated: 0.54 IU/mL (ref 0.30–0.70)
Heparin Unfractionated: 0.87 IU/mL — ABNORMAL HIGH (ref 0.30–0.70)

## 2019-04-21 LAB — HEMOGLOBIN A1C
Hgb A1c MFr Bld: 8.7 % — ABNORMAL HIGH (ref 4.8–5.6)
Mean Plasma Glucose: 202.99 mg/dL

## 2019-04-21 LAB — ACETAMINOPHEN LEVEL: Acetaminophen (Tylenol), Serum: <10 ug/mL — ABNORMAL LOW (ref 10–30)

## 2019-04-21 LAB — POC SARS CORONAVIRUS 2 AG -  ED: SARS Coronavirus 2 Ag: NEGATIVE

## 2019-04-21 LAB — SARS CORONAVIRUS 2 (TAT 6-24 HRS): SARS Coronavirus 2: NEGATIVE

## 2019-04-21 LAB — CBG MONITORING, ED: Glucose-Capillary: 365 mg/dL — ABNORMAL HIGH (ref 70–99)

## 2019-04-21 MED ORDER — HEPARIN (PORCINE) 25000 UT/250ML-% IV SOLN
1250.0000 [IU]/h | INTRAVENOUS | Status: AC
  Administered 2019-04-21 (×2): 1350 [IU]/h via INTRAVENOUS
  Filled 2019-04-21 (×2): qty 250

## 2019-04-21 MED ORDER — TEMAZEPAM 15 MG PO CAPS: 30.0000 mg | ORAL_CAPSULE | Freq: Every evening | ORAL | Status: DC | PRN

## 2019-04-21 MED ORDER — ONDANSETRON HCL 4 MG/2ML IJ SOLN: 4.0000 mg | Freq: Four times a day (QID) | INTRAMUSCULAR | Status: DC | PRN

## 2019-04-21 MED ORDER — DOXAZOSIN MESYLATE 8 MG PO TABS
8.0000 mg | ORAL_TABLET | Freq: Every day | ORAL | Status: DC
  Administered 2019-04-21 – 2019-04-22 (×2): 8 mg via ORAL
  Filled 2019-04-21 (×2): qty 1

## 2019-04-21 MED ORDER — MORPHINE SULFATE (PF) 4 MG/ML IV SOLN
4.0000 mg | Freq: Once | INTRAVENOUS | Status: AC
  Administered 2019-04-21: 4 mg via INTRAVENOUS
  Filled 2019-04-21: qty 1

## 2019-04-21 MED ORDER — LORATADINE 10 MG PO TABS
10.0000 mg | ORAL_TABLET | Freq: Every day | ORAL | Status: DC
  Administered 2019-04-21 – 2019-04-22 (×2): 10 mg via ORAL
  Filled 2019-04-21 (×2): qty 1

## 2019-04-21 MED ORDER — PRIMIDONE 50 MG PO TABS
100.0000 mg | ORAL_TABLET | Freq: Every day | ORAL | Status: DC
  Administered 2019-04-21 – 2019-04-22 (×2): 100 mg via ORAL
  Filled 2019-04-21 (×2): qty 2

## 2019-04-21 MED ORDER — HYDRALAZINE HCL 20 MG/ML IJ SOLN: 10.0000 mg | INTRAMUSCULAR | Status: DC | PRN

## 2019-04-21 MED ORDER — SERTRALINE HCL 100 MG PO TABS
100.0000 mg | ORAL_TABLET | Freq: Every day | ORAL | Status: DC
  Administered 2019-04-21 – 2019-04-22 (×2): 100 mg via ORAL
  Filled 2019-04-21: qty 1
  Filled 2019-04-21: qty 2

## 2019-04-21 MED ORDER — PERFLUTREN LIPID MICROSPHERE
1.0000 mL | INTRAVENOUS | Status: AC | PRN
  Administered 2019-04-21: 3 mL via INTRAVENOUS
  Filled 2019-04-21: qty 10

## 2019-04-21 MED ORDER — ONDANSETRON HCL 4 MG PO TABS: 4.0000 mg | ORAL_TABLET | Freq: Four times a day (QID) | ORAL | Status: DC | PRN

## 2019-04-21 MED ORDER — CARVEDILOL 12.5 MG PO TABS
12.5000 mg | ORAL_TABLET | Freq: Two times a day (BID) | ORAL | Status: DC
  Administered 2019-04-21 – 2019-04-22 (×3): 12.5 mg via ORAL
  Filled 2019-04-21 (×4): qty 1

## 2019-04-21 MED ORDER — ATORVASTATIN CALCIUM 40 MG PO TABS
40.0000 mg | ORAL_TABLET | Freq: Every day | ORAL | Status: DC
  Filled 2019-04-21: qty 1

## 2019-04-21 MED ORDER — HEPARIN SODIUM (PORCINE) 5000 UNIT/ML IJ SOLN
4000.0000 [IU] | Freq: Once | INTRAMUSCULAR | Status: AC
  Administered 2019-04-21: 4000 [IU] via INTRAVENOUS
  Filled 2019-04-21: qty 1

## 2019-04-21 MED ORDER — INSULIN ASPART 100 UNIT/ML ~~LOC~~ SOLN
0.0000 [IU] | Freq: Three times a day (TID) | SUBCUTANEOUS | Status: DC
  Administered 2019-04-21: 9 [IU] via SUBCUTANEOUS
  Administered 2019-04-22: 2 [IU] via SUBCUTANEOUS
  Filled 2019-04-21: qty 0.09

## 2019-04-21 MED ORDER — INSULIN ASPART 100 UNIT/ML ~~LOC~~ SOLN
0.0000 [IU] | Freq: Every day | SUBCUTANEOUS | Status: DC
  Administered 2019-04-21: 3 [IU] via SUBCUTANEOUS
  Filled 2019-04-21: qty 0.05

## 2019-04-21 MED ORDER — AMLODIPINE BESYLATE 5 MG PO TABS
2.5000 mg | ORAL_TABLET | Freq: Every day | ORAL | Status: DC
  Administered 2019-04-21 – 2019-04-22 (×2): 2.5 mg via ORAL
  Filled 2019-04-21 (×2): qty 1

## 2019-04-21 MED ORDER — PANTOPRAZOLE SODIUM 40 MG PO TBEC
40.0000 mg | DELAYED_RELEASE_TABLET | Freq: Every day | ORAL | Status: DC
  Administered 2019-04-21 – 2019-04-22 (×2): 40 mg via ORAL
  Filled 2019-04-21 (×2): qty 1

## 2019-04-21 NOTE — Progress Notes (Signed)
ANTICOAGULATION CONSULT NOTE - Initial Consult  Pharmacy Consult for Heparin Indication: pulmonary embolus  No Known Allergies  Patient Measurements: Height: 5\' 9"  (175.3 cm) Weight: 213 lb (96.6 kg) IBW/kg (Calculated) : 70.7 Heparin Dosing Weight:   Vital Signs: Temp: 98.1 F (36.7 C) (03/09 2130) Temp Source: Oral (03/09 2130) BP: 178/70 (03/10 0030) Pulse Rate: 101 (03/10 0030)  Labs: Recent Labs    04/20/19 0011 04/20/19 2247  HGB  --  15.3  HCT  --  46.3  PLT  --  287  CREATININE  --  1.34*  TROPONINIHS 5 5    Estimated Creatinine Clearance: 47.9 mL/min (A) (by C-G formula based on SCr of 1.34 mg/dL (H)).   Medical History: Past Medical History:  Diagnosis Date  . A-fib ([place])   . Hypertension     Medications:  (Not in a hospital admission)  Infusions:  . heparin      Assessment: Patient with new PE.  No oral anticoagulants noted on med rec. Heparin bolus ordered by MD.    Goal of Therapy:  Heparin level 0.3-0.7 units/ml Monitor platelets by anticoagulation protocol: Yes   Plan:  Heparin drip at 1350 units/hr Daily CBC Next heparin level at [name] [place] 04/21/2019,1:28 AM

## 2019-04-21 NOTE — Progress Notes (Signed)
ANTICOAGULATION CONSULT NOTE - Follow Up Consult  Pharmacy Consult for Heparin Indication: pulmonary embolus  No Known Allergies  Patient Measurements: Height: 5\' 9"  (175.3 cm) Weight: 213 lb (96.6 kg) IBW/kg (Calculated) : 70.7 Heparin Dosing Weight:   Vital Signs: Temp: 98 F (36.7 C) (03/10 2103) Temp Source: Oral (03/10 2103) BP: 112/56 (03/10 2103) Pulse Rate: 81 (03/10 2103)  Labs: Recent Labs    04/20/19 2247 04/21/19 0510 04/21/19 0707 04/21/19 1336 04/21/19 2125  HGB 15.3 14.5  --   --   --   HCT 46.3 42.4  --   --   --   PLT 287 313  --   --   --   HEPARINUNFRC  --   --   --  0.54 0.87*  CREATININE 1.34*  --  1.37*  --   --   TROPONINIHS 5 10 11   --   --     Estimated Creatinine Clearance: 46.9 mL/min (A) (by C-G formula based on SCr of 1.37 mg/dL (H)).   Medications:  Infusions:  . heparin 1,350 Units/hr (04/21/19 2217)    Assessment: Patient with high heparin level.  No heparin issues per RN.  Goal of Therapy:  Heparin level 0.3-0.7 units/ml Monitor platelets by anticoagulation protocol: Yes   Plan:  Decrease heparin to 1250 units/hr Recheck level at [place], Shea Stakes [place] 04/21/2019,10:43 PM

## 2019-04-21 NOTE — Progress Notes (Addendum)
Patient ID: Dennis Stephens, male   DOB: January 01, 1934, 86 y.o.   MRN: [id number] Request received from [facility] for possible image guided biopsy on patient.  Latest imaging studies were reviewed by Dr. Pascal Lux (pager [phone]).  He initially recommends pulmonary consultation for possible bronchoscopic biopsy.  If they are unable to perform we could consider possible subtle liver lesion biopsy.  If liver lesion not well visualized at time of biopsy would recommend PET scan as next step.  Oncology consult pending.

## 2019-04-21 NOTE — ED Notes (Signed)
Echo is at bedside. Will admin scheduled morning medications after echo.

## 2019-04-21 NOTE — ED Notes (Signed)
Patient given meal tray.

## 2019-04-21 NOTE — Consult Note (Signed)
NAME:  Dennis Stephens, MRN:  [id number], DOB:  02-05-1934, LOS: 0 ADMISSION DATE:  04/20/2019, CONSULTATION DATE:  04/21/19 REFERRING MD:  Tyler Pita MD, CHIEF COMPLAINT: Lung nodule, lymphadenopathy  Brief History   86 year old ex-smoker with, hyperlipidemia, BPH presenting with chest, abdomen abdominal pain.  Found to have bilateral lobar pulmonary embolism with bilateral lung nodules, lymphadenopathy and possible hepatic lesion concerning for malignancy. IR feels that the hepatic lesion is subtle and may not be a good candidate for liver biopsy.  PCCM consulted for bronchoscopy.  Past Medical History   Hypertension, hyperlipidemia, BPH. Has history of A. fib in the chart but in normal sinus rhythm now 60-pack-year smoker.  Quit 35 years ago.  Retired Manufacturing systems engineer Hospital Events   3/10 Admit  Consults:  PCCM  Procedures:    Significant Diagnostic Tests:  CTA 04/20/2019-no apparent peripheral pulmonary emboli, no right heart strain, bilateral pulmonary nodules which are increased in size, mediastinal, hilar lymphadenopathy.  Hepatic lesion, I have reviewed the images personally.  Micro Data:    Antimicrobials:    Interim history/subjective:    Objective   Blood pressure (!) 127/58, pulse 85, temperature 98.2 F (36.8 C), temperature source Oral, resp. rate 20, height 5\' 9"  (1.753 m), weight 96.6 kg, SpO2 94 %.       No intake or output data in the 24 hours ending 04/21/19 1526 Filed Weights   04/20/19 2131  Weight: 96.6 kg    Examination: Gen:      No acute distress HEENT:  EOMI, sclera anicteric Neck:     No masses; no thyromegaly Lungs:    Clear to auscultation bilaterally; normal respiratory effort CV:         Regular rate and rhythm; no murmurs Abd:      + bowel sounds; soft, non-tender; no palpable masses, no distension Ext:    No edema; adequate peripheral perfusion Skin:      Warm and dry; no rash Neuro: alert and oriented x 3 Psych: normal mood and  affect  Resolved Hospital Problem list     Assessment & Plan:  Lung nodules, lymphadenopathy Suspicious for malignancy in a smoker Discussed work-up with bronchoscopy, EBUS and navigational biopsy.  He is aware of the risks and benefits including bleeding, pneumothorax and has agreed to proceed He is not sure if he wants to get treated but wants a diagnosis  We will arrange for procedure on 3/12 AM at [facility].  Patient will be transported there for the procedure and back. Keep n.p.o. after midnight, hold heparin after midnight on 3/11  Bilateral PE On heparin drip.  Since the clots are small with no RV strain it is okay to hold heparin temporarily for procedure on 3/12. Can transition to [place] after bronch.  Best practice:  Diet: PO Pain/Anxiety/Delirium protocol (if indicated): NA VAP protocol (if indicated): NA DVT prophylaxis: Heparin gtt GI prophylaxis: Protonix Glucose control: Monitor Mobility: OOB Code Status: Full Family Communication: patient updated Disposition:Floor bed  Labs   CBC: Recent Labs  Lab 04/20/19 2247 04/21/19 0510  WBC 14.9* 18.1*  HGB 15.3 14.5  HCT 46.3 42.4  MCV 93.3 92.0  PLT 287 494    Basic Metabolic Panel: Recent Labs  Lab 04/20/19 2247 04/21/19 0707  NA 131* 129*  K 4.4 4.5  CL 95* 94*  CO2 22 21*  GLUCOSE 357* 433*  BUN 20 17  CREATININE 1.34* 1.37*  CALCIUM 9.3 9.1   GFR: Estimated Creatinine Clearance: 46.9 mL/min (  A) (by C-G formula based on SCr of 1.37 mg/dL (H)). Recent Labs  Lab 04/20/19 2247 04/21/19 0510  WBC 14.9* 18.1*    Liver Function Tests: Recent Labs  Lab 04/20/19 2247 04/21/19 0707  AST 216* 326*  ALT 155* 223*  ALKPHOS 468* 472*  BILITOT 4.3* 6.8*  PROT 7.6 7.4  ALBUMIN 3.7 3.4*   Recent Labs  Lab 04/20/19 2247  LIPASE 39   No results for input(s): AMMONIA in the last 168 hours.  ABG    Component Value Date/Time   TCO2 25 01/20/2019 1823     Coagulation Profile: No results for  input(s): INR, PROTIME in the last 168 hours.  Cardiac Enzymes: No results for input(s): CKTOTAL, CKMB, CKMBINDEX, TROPONINI in the last 168 hours.  HbA1C: Hgb A1c MFr Bld  Date/Time Value Ref Range Status  04/21/2019 05:34 AM 8.7 (H) 4.8 - 5.6 % Final    Comment:    (NOTE) Pre diabetes:          5.7%-6.4% Diabetes:              >6.4% Glycemic control for   <7.0% adults with diabetes     CBG: Recent Labs  Lab 04/21/19 1159  GLUCAP 365*    Review of Systems:   REVIEW OF SYSTEMS:   All negative; except for those that are bolded, which indicate positives.  Constitutional: weight loss, weight gain, night sweats, fevers, chills, fatigue, weakness.  HEENT: headaches, sore throat, sneezing, nasal congestion, post nasal drip, difficulty swallowing, tooth/dental problems, visual complaints, visual changes, ear aches. Neuro: difficulty with speech, weakness, numbness, ataxia. CV:  chest pain, orthopnea, PND, swelling in lower extremities, dizziness, palpitations, syncope.  Resp: cough, hemoptysis, dyspnea, wheezing. GI: heartburn, indigestion, abdominal pain, nausea, vomiting, diarrhea, constipation, change in bowel habits, loss of appetite, hematemesis, melena, hematochezia.  GU: dysuria, change in color of urine, urgency or frequency, flank pain, hematuria. MSK: joint pain or swelling, decreased range of motion. Psych: change in mood or affect, depression, anxiety, suicidal ideations, homicidal ideations. Skin: rash, itching, bruising.  Past Medical History  He,  has a past medical history of A-fib ([place]) and Hypertension.   Surgical History   History reviewed. No pertinent surgical history.   Social History   reports that he has quit smoking. He has never used smokeless tobacco. He reports previous alcohol use. He reports previous drug use.   Family History   His family history is negative for Diabetes Mellitus II.   Allergies No Known Allergies   Home Medications    Prior to Admission medications   Medication Sig Start Date End Date Taking? Authorizing Provider  amLODipine (NORVASC) 2.5 MG tablet Take 2.5 mg by mouth daily.   Yes [provider]  atorvastatin (LIPITOR) 40 MG tablet Take 40 mg by mouth daily.   Yes [provider]  carvedilol (COREG) 12.5 MG tablet Take 12.5 mg by mouth 2 (two) times daily with a meal.   Yes [provider]  doxazosin (CARDURA) 8 MG tablet Take 8 mg by mouth daily.   Yes [provider]  losartan (COZAAR) 100 MG tablet Take 100 mg by mouth daily.   Yes [provider]  omeprazole (PRILOSEC) 20 MG capsule Take 20 mg by mouth daily.   Yes [provider]  primidone (MYSOLINE) 50 MG tablet Take 100 mg by mouth every morning.   Yes [provider]  sertraline (ZOLOFT) 100 MG tablet Take 100 mg by mouth daily.  Yes [provider]  temazepam (RESTORIL) 30 MG capsule Take 30 mg by mouth at bedtime as needed for sleep.   Yes [provider]    Marshell Garfinkel MD [facility] Please see [email or web address] for pager details.  04/21/2019, 4:19 PM

## 2019-04-21 NOTE — Progress Notes (Signed)
  Echocardiogram 2D Echocardiogram has been performed.  Dennis Stephens 04/21/2019, 9:45 AM

## 2019-04-21 NOTE — Progress Notes (Signed)
Lower extremity venous has been completed.   Preliminary results in CV Proc.   [name] 04/21/2019 8:33 AM

## 2019-04-21 NOTE — ED Notes (Signed)
Will need to collect a HEPARIN level at 11:00am.

## 2019-04-21 NOTE — Progress Notes (Signed)
[facility]                                                                              Patient Demographics  Dennis Stephens, is a 86 y.o. male, DOB - 1933-07-12, IOE:[id number]  Admit date - 04/20/2019   Admitting Physician Rise Patience, MD  Outpatient Primary MD for the patient is [facility]  Outpatient specialists:   LOS - 0  days   Medical records reviewed and are as summarized below:    Chief Complaint  Patient presents with  . Chest Pain       Brief summary   Patient is 86 year old male with history of hypertension, hyperlipidemia, BPH who recently moved 9 months ago from [place] presented to the ER for chest pain.  Patient reported he was having off-and-on chest pain for last 2 months, acutely worsened over the 24 hours prior to admission.  No productive cough, fevers or chills, no associated shortness of breath.  Patient had recently traveled to [place] in the car 2 weeks prior to admission. In ED, labs showed LFTs elevated, creatinine 1.3, CT angiogram of the chest abdomen pelvis showed bilateral pulmonary embolism, no strain, hilar and mediastinal lymphadenopathy with possible mets to liver.  Patient was started on heparin drip  COVID-19 test negative  Assessment & Plan    Principal Problem: Acute bilateral pulmonary embolism ([place]) -PE likely has been caused by the long distance car drive versus underlying malignancy -Continue IV heparin drip, will transition to oral NOACs after interventions, biopsy -Doppler ultrasound lower extremity showed no DVT bilaterally -2D echo showed EF of 55 to 18%, grade 1 diastolic dysfunction, low normal RV function   Active Problems: Hilar and mediastinal lymphadenopathy with possible liver metastasis, spiculated pulmonary nodule -CT chest abdomen pelvis showed suspicious bilateral upper lobe solid pulmonary nodules, 2.4 cm in the left upper lobe, 2.6X 2.0 in the right upper lobe, both nodules  increased in size since December 2020.  Metastatic appearing right paratracheal adenopathy, superior mediastinal lymphadenopathy, metastatic appearance of right hilar lymph node, new hepatic lesions -IR was consulted, recommended bronchoscopy.  Discussed with Dr. Vaughan Browner for pulmonology consult.      Essential hypertension -BP currently stable, continue amlodipine, Coreg -Hold off on ARB    Elevated LFTs -Likely due to liver mets, -Hold statin, hepatitis panel negative  Chronic kidney disease stage III -Creatinine appears to be at baseline, hold ARB for now  Type 2 diabetes mellitus, new diagnosis  -CBG 433 -Hemoglobin A1c 8.7 -Placed on sliding scale insulin, carb modified diet -Will place nutrition consult, diabetic coordinator consult for education to the patient. Will place on oral hypoglycemics at the time of discharge  Obesity Estimated body mass index is 31.45 kg/m as calculated from the following:   Height as of this encounter: 5\' 9"  (1.753 m).   Weight as of this encounter: 96.6 kg.  Code Status: DNR status DVT Prophylaxis: Heparin drip Family Communication: Discussed all imaging results, lab results, explained to the patient Disposition Plan: Patient from home, anticipated discharge home once work-up for possible malignancy is completed, will need NOACs after biopsy.  Anticipate discharge in  1 or 2 days.  Time Spent in minutes 35 minutes  Procedures:  CT angiogram chest and abdomen  Consultants:   Interventional radiology Pulmonology  Antimicrobials:   Anti-infectives (From admission, onward)   None          Medications  Scheduled Meds: . amLODipine  2.5 mg Oral Daily  . carvedilol  12.5 mg Oral BID WC  . doxazosin  8 mg Oral Daily  . insulin aspart  0-5 Units Subcutaneous QHS  . insulin aspart  0-9 Units Subcutaneous TID WC  . pantoprazole  40 mg Oral Daily  . primidone  100 mg Oral Daily  . sertraline  100 mg Oral Daily  . sodium chloride  flush  3 mL Intravenous Once   Continuous Infusions: . heparin 1,350 Units/hr (04/21/19 0216)   PRN Meds:.hydrALAZINE, ondansetron **OR** ondansetron (ZOFRAN) IV, temazepam      Subjective:   Dennis Stephens was seen and examined today.  Complaining of chest pain, shortness of breath.  No fevers or chills or any productive cough. Patient denies dizziness,  N/V/D/C, new weakness, numbess, tingling.    Objective:   Vitals:   04/21/19 1115 04/21/19 1200 04/21/19 1215 04/21/19 1419  BP: 131/61 (!) 133/59 (!) 132/96 131/88  Pulse: 87 86 91 90  Resp: (!) 21 (!) 23 20 20   Temp:      TempSrc:      SpO2: 91% 96% 98% 99%  Weight:      Height:       No intake or output data in the 24 hours ending 04/21/19 1442   Wt Readings from Last 3 Encounters:  04/20/19 96.6 kg  01/20/19 95.3 kg     Exam  General: Alert and oriented x 3, NAD  Cardiovascular: S1 S2 auscultated, no murmurs, RRR  Respiratory: Clear to auscultation bilaterally, no wheezing, rales or rhonchi  Gastrointestinal: Soft, nontender, nondistended, + bowel sounds  Ext: no pedal edema bilaterally  Neuro: No new deficits  Musculoskeletal: No digital cyanosis, clubbing  Skin: No rashes  Psych: Normal affect and demeanor, alert and oriented x3    Data Reviewed:  I have personally reviewed following labs and imaging studies  Micro Results Recent Results (from the past 240 hour(s))  SARS CORONAVIRUS 2 (TAT 6-24 HRS) Nasopharyngeal Nasopharyngeal Swab     Status: None   Collection Time: 04/21/19  7:08 AM   Specimen: Nasopharyngeal Swab  Result Value Ref Range Status   SARS Coronavirus 2 NEGATIVE NEGATIVE Final    Comment: (NOTE) SARS-CoV-2 target nucleic acids are NOT DETECTED. The SARS-CoV-2 RNA is generally detectable in upper and lower respiratory specimens during the acute phase of infection. Negative results do not preclude SARS-CoV-2 infection, do not rule out co-infections with other pathogens, and  should not be used as the sole basis for treatment or other patient management decisions. Negative results must be combined with clinical observations, patient history, and epidemiological information. The expected result is Negative. Fact Sheet for Patients: SugarRoll.be Fact Sheet for Healthcare Providers: https://www.woods-mathews.com/ This test is not yet approved or cleared by the [place] FDA and  has been authorized for detection and/or diagnosis of SARS-CoV-2 by FDA under an Emergency Use Authorization (EUA). This EUA will remain  in effect (meaning this test can be used) for the duration of the COVID-19 declaration under Section 56 4(b)(1) of the Act, 21 U.S.C. section 360bbb-3(b)(1), unless the authorization is terminated or revoked sooner. Performed at [facility], [place],  [place] [address]     Radiology Reports DG Chest 2 View  Result Date: 04/20/2019 CLINICAL DATA:  Chest pain for several hours, history of lung carcinoma EXAM: CHEST - 2 VIEW COMPARISON:  01/20/2019 CT FINDINGS: Cardiac shadow is within normal limits. There is a rounded nodular density identified in the left lung measuring 3.3 cm which corresponds to that seen on the prior exam at which time it measured 1.8 cm. Patchy changes are noted in the right upper lobe also increased when compared with the prior exam. No sizable effusion is noted. No bony abnormality is noted. IMPRESSION: Enlarging bilateral upper lobe nodules as described. This is consistent with the patient's given clinical history Electronically Signed   By: Inez Catalina M.D.   On: 04/20/2019 21:56   CT Head Wo Contrast  Result Date: 04/21/2019 CLINICAL DATA:  86 year old male with pulmonary emboli, malignant appearing mediastinal lymphadenopathy and liver lesion on CT yesterday. Query brain Mets. EXAM: CT HEAD WITHOUT CONTRAST TECHNIQUE: Contiguous axial images were obtained from  the base of the skull through the vertex without intravenous contrast. COMPARISON:  No prior head imaging. FINDINGS: Brain: IV contrast was administered for chest abdomen and pelvis CT 1 hour before these images. Patchy bilateral white matter hypodensity, but no areas suspicious for vasogenic edema. No midline shift, mass effect, or evidence of intracranial mass lesion. No abnormal enhancement identified. No ventriculomegaly. No cortical encephalomalacia identified. No cortically based acute infarct identified. There is a small hypodensity in the posterior right cerebellum on series 2, image 7, probably a small chronic cerebellar infarct. Vascular: Some residual intravascular contrast is present as above. The major intracranial vascular structures seem to be enhancing as expected. Skull: No acute or suspicious osseous lesion identified. Sinuses/Orbits: Visualized paranasal sinuses and mastoids are clear. Other: No acute orbit or scalp soft tissue findings. IMPRESSION: 1. No metastatic disease to the brain is identified by CT with very delayed contrast timing. MRI Head without and with contrast would be most sensitive. 2. No acute intracranial abnormality identified. 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 Electronically Signed   By: Genevie Ann M.D.   On: 04/21/2019 01:08   CT Angio Chest PE W and/or Wo Contrast  Result Date: 04/21/2019 CLINICAL DATA:  Non-specific chest pain and abdominal pain since 1500 hours. History of hypertension and atrial fibrillation. EXAM: CT ANGIOGRAPHY CHEST CT ABDOMEN AND PELVIS WITH CONTRAST TECHNIQUE: Multidetector CT imaging of the chest was performed using the standard protocol during bolus administration of intravenous contrast. Multiplanar CT image reconstructions and MIPs were obtained to evaluate the vascular anatomy. Multidetector CT imaging of the abdomen and pelvis was performed using the standard protocol  during bolus administration of intravenous contrast. Multidetector CT imaging of the chest was performed using the standard protocol during bolus administration of intravenous contrast. Multiplanar CT image reconstructions and MIPs were obtained to evaluate the vascular anatomy. Automatic exposure control utilized. CONTRAST:  127mL OMNIPAQUE IOHEXOL 350 MG/ML SOLN COMPARISON:  January 20, 2019. FINDINGS: CTA CHEST FINDINGS Cardiovascular: Pulmonary emboli in the bilateral proximal lobar pulmonary arteries and more peripheral bilateral pulmonary arteries. No central pulmonary artery embolism. Right ventricular to left ventricular ratio 0.7; no right heart strain. Mild cardiomegaly with fatty hypertrophy of the inter atrial septum and four-vessel moderate to severe coronary calcification. Mediastinum/Nodes: Metastatic appearing right paratracheal conglomerate adenopathy measuring 3.2 by 2.4 cm. Metastatic-appearing superior mediastinal lymph node measuring 23 by 16 mm. Metastatic appearing right hilar  lymph node measuring 16 by 22 mm. Additional smaller mediastinal and bilateral hilar lymph nodes that could be metastatic or benign. Small hiatal hernia. Lungs/Pleura: No pleural effusion. Bilateral spiculated upper lobe pulmonary nodules measuring 2.4 cm diameter in the left upper lobe on series 2, image 39, and 2.6 x 2.0 cm in the right upper lobe on image 26. Both nodules have increased in size is since December 2020 at which time they measured 19 x 18 mm on the left, and 8 mm diameter on the right. Respiratory motion artifact Upper Abdomen: Reported on concomitant CT abdomen and pelvis reported above. Musculoskeletal: Bone demineralization. Moderate skeletal degenerative change. A 6 mm sclerotic lesion in the T12 vertebral body stable compared to December 2020. Review of the MIP images confirms the above findings. CT ABDOMEN and PELVIS FINDINGS Hepatobiliary: A new 14 mm hypoattenuating lesion in the right hemi  liver, highly worrisome for metastasis. Chronic mild intrahepatic and 14 mm proximal common bile duct dilatation with normal distal tapering. Normal gallbladder. Pancreas: Normal. Spleen: Normal. Adrenals/Urinary Tract: Normal bilateral adrenal glands. Bilateral medical renal disease and simple appearing renal cortical cysts measuring up to 4.4 cm on the right. No hydronephrosis or nephroureterolithiasis bilaterally. No apparent urinary bladder abnormality. A dense 10 mm right renal midpole hypoattenuation. Stomach/Bowel: No bowel obstruction. Moderate stool burden and diverticulosis coli without an acute diverticulitis or apparent bowel wall thickening. Normal appendix, axial image 60. Vascular/Lymphatic: Aortic bilateral iliac in major abdominal artery calcified atherosclerosis with probably clinically significant 75% narrowing of the left common iliac artery. No abdominal aortic aneurysm. Small nonspecific retrocrural, gastrohepatic ligament and retroperitoneal lymph nodes. A mildly enlarged periportal lymph node measuring 12 x 16 mm. No abdominal aortic dissection. Reproductive: Mild prostatomegaly. Other: None. Musculoskeletal: Moderate skeletal degenerative change. A 9 mm ovoid sclerotic lesion in the right proximal femoral shaft, unchanged. Review of the MIP images confirms the above findings. I discussed critical Value/emergent results by telephone at the time of interpretation on 04/21/2019 at 12:12 am with provider Providence Lanius , who verbally acknowledged these results. IMPRESSION: Acute bilateral lobar and peripheral pulmonary emboli without right heart strain. No thoracoabdominal aortic dissection. Suspicious bilateral upper lobe solid pulmonary nodules, both increased in size since December 2020. Histopathological correlation recommended. An embolic or infectious etiology are less likely differential considerations. Malignant-appearing mediastinal and right hilar adenopathy. Enlarged nonspecific  gastrohepatic node. Additional small nonspecific lymph nodes above and below diaphragm which could be metastatic or benign. New hepatic lesion concerning for metastatic disease. Normal bilateral adrenal glands. A new 10 mm dense right renal midpole lesion; renal ultrasonography recommended. A proteinaceous or hemorrhagic cortical cyst or malignant renal lesion are differential considerations. Mild cardiomegaly with four-vessel moderate to severe coronary calcification. Small hiatal hernia. Stable 6 mm T12 and 9 mm right proximal femoral shaft sclerotic lesions since December 2020. Thoracoabdominal aorta and major abdominal artery calcified atherosclerosis without aneurysm. Probably clinically significant 75% atherosclerotic narrowing of the right common iliac artery. Diverticulosis coli. Chronic biliary dilatation. Electronically Signed   By: Revonda Humphrey   On: 04/21/2019 00:17   CT ABDOMEN PELVIS W CONTRAST  Result Date: 04/21/2019 CLINICAL DATA:  Non-specific chest pain and abdominal pain since 1500 hours. History of hypertension and atrial fibrillation. EXAM: CT ANGIOGRAPHY CHEST CT ABDOMEN AND PELVIS WITH CONTRAST TECHNIQUE: Multidetector CT imaging of the chest was performed using the standard protocol during bolus administration of intravenous contrast. Multiplanar CT image reconstructions and MIPs were obtained to evaluate the vascular anatomy. Multidetector CT imaging  of the abdomen and pelvis was performed using the standard protocol during bolus administration of intravenous contrast. Multidetector CT imaging of the chest was performed using the standard protocol during bolus administration of intravenous contrast. Multiplanar CT image reconstructions and MIPs were obtained to evaluate the vascular anatomy. Automatic exposure control utilized. CONTRAST:  120mL OMNIPAQUE IOHEXOL 350 MG/ML SOLN COMPARISON:  January 20, 2019. FINDINGS: CTA CHEST FINDINGS Cardiovascular: Pulmonary emboli in the bilateral  proximal lobar pulmonary arteries and more peripheral bilateral pulmonary arteries. No central pulmonary artery embolism. Right ventricular to left ventricular ratio 0.7; no right heart strain. Mild cardiomegaly with fatty hypertrophy of the inter atrial septum and four-vessel moderate to severe coronary calcification. Mediastinum/Nodes: Metastatic appearing right paratracheal conglomerate adenopathy measuring 3.2 by 2.4 cm. Metastatic-appearing superior mediastinal lymph node measuring 23 by 16 mm. Metastatic appearing right hilar lymph node measuring 16 by 22 mm. Additional smaller mediastinal and bilateral hilar lymph nodes that could be metastatic or benign. Small hiatal hernia. Lungs/Pleura: No pleural effusion. Bilateral spiculated upper lobe pulmonary nodules measuring 2.4 cm diameter in the left upper lobe on series 2, image 39, and 2.6 x 2.0 cm in the right upper lobe on image 26. Both nodules have increased in size is since December 2020 at which time they measured 19 x 18 mm on the left, and 8 mm diameter on the right. Respiratory motion artifact Upper Abdomen: Reported on concomitant CT abdomen and pelvis reported above. Musculoskeletal: Bone demineralization. Moderate skeletal degenerative change. A 6 mm sclerotic lesion in the T12 vertebral body stable compared to December 2020. Review of the MIP images confirms the above findings. CT ABDOMEN and PELVIS FINDINGS Hepatobiliary: A new 14 mm hypoattenuating lesion in the right hemi liver, highly worrisome for metastasis. Chronic mild intrahepatic and 14 mm proximal common bile duct dilatation with normal distal tapering. Normal gallbladder. Pancreas: Normal. Spleen: Normal. Adrenals/Urinary Tract: Normal bilateral adrenal glands. Bilateral medical renal disease and simple appearing renal cortical cysts measuring up to 4.4 cm on the right. No hydronephrosis or nephroureterolithiasis bilaterally. No apparent urinary bladder abnormality. A dense 10 mm right  renal midpole hypoattenuation. Stomach/Bowel: No bowel obstruction. Moderate stool burden and diverticulosis coli without an acute diverticulitis or apparent bowel wall thickening. Normal appendix, axial image 60. Vascular/Lymphatic: Aortic bilateral iliac in major abdominal artery calcified atherosclerosis with probably clinically significant 75% narrowing of the left common iliac artery. No abdominal aortic aneurysm. Small nonspecific retrocrural, gastrohepatic ligament and retroperitoneal lymph nodes. A mildly enlarged periportal lymph node measuring 12 x 16 mm. No abdominal aortic dissection. Reproductive: Mild prostatomegaly. Other: None. Musculoskeletal: Moderate skeletal degenerative change. A 9 mm ovoid sclerotic lesion in the right proximal femoral shaft, unchanged. Review of the MIP images confirms the above findings. I discussed critical Value/emergent results by telephone at the time of interpretation on 04/21/2019 at 12:12 am with provider Providence Lanius , who verbally acknowledged these results. IMPRESSION: Acute bilateral lobar and peripheral pulmonary emboli without right heart strain. No thoracoabdominal aortic dissection. Suspicious bilateral upper lobe solid pulmonary nodules, both increased in size since December 2020. Histopathological correlation recommended. An embolic or infectious etiology are less likely differential considerations. Malignant-appearing mediastinal and right hilar adenopathy. Enlarged nonspecific gastrohepatic node. Additional small nonspecific lymph nodes above and below diaphragm which could be metastatic or benign. New hepatic lesion concerning for metastatic disease. Normal bilateral adrenal glands. A new 10 mm dense right renal midpole lesion; renal ultrasonography recommended. A proteinaceous or hemorrhagic cortical cyst or malignant renal lesion are  differential considerations. Mild cardiomegaly with four-vessel moderate to severe coronary calcification. Small hiatal  hernia. Stable 6 mm T12 and 9 mm right proximal femoral shaft sclerotic lesions since December 2020. Thoracoabdominal aorta and major abdominal artery calcified atherosclerosis without aneurysm. Probably clinically significant 75% atherosclerotic narrowing of the right common iliac artery. Diverticulosis coli. Chronic biliary dilatation. Electronically Signed   By: Revonda Humphrey   On: 04/21/2019 00:17   ECHOCARDIOGRAM COMPLETE  Result Date: 04/21/2019    ECHOCARDIOGRAM REPORT   Patient Name:   GEOVANY TRUDO Date of Exam: 04/21/2019 Medical Rec #:  [id number]       Height:       69.0 in Accession #:    [id number]      Weight:       213.0 lb Date of Birth:  April 24, 1933       BSA:          2.122 m Patient Age:    41 years        BP:           159/76 mmHg Patient Gender: M               HR:           98 bpm. Exam Location:  Inpatient Procedure: 2D Echo, Cardiac Doppler, Color Doppler and Intracardiac            Opacification Agent Indications:    Pulmonary embolus 415.19  History:        Patient has no prior history of Echocardiogram examinations.                 Risk Factors:Hypertension and Former Smoker. Pulmonary embolus.  Sonographer:    Vickie Epley RDCS Referring Phys: [place]  Sonographer Comments: Technically difficult study due to poor echo windows. IMPRESSIONS  1. Left ventricular ejection fraction, by estimation, is 55 to 60%. The left ventricle has normal function. The left ventricle has no regional wall motion abnormalities. Left ventricular diastolic parameters are consistent with Grade I diastolic dysfunction (impaired relaxation).  2. RV not well visualized. Appears to have low normal function. There is ventricular septal dyssynchrony - may be due to bundle branch block. Negative McConnell's sign.. Right ventricular systolic function is low normal. The right ventricular size is normal.  3. The mitral valve is grossly normal. Trivial mitral valve regurgitation.  4. The aortic valve is  tricuspid. Aortic valve regurgitation is not visualized.  5. The inferior vena cava is dilated in size with >50% respiratory variability, suggesting right atrial pressure of 8 mmHg. FINDINGS  Left Ventricle: Left ventricular ejection fraction, by estimation, is 55 to 60%. The left ventricle has normal function. The left ventricle has no regional wall motion abnormalities. Definity contrast agent was given IV to delineate the left ventricular  endocardial borders. The left ventricular internal cavity size was normal in size. There is no left ventricular hypertrophy. Abnormal (paradoxical) septal motion, consistent with left bundle branch block. Left ventricular diastolic parameters are consistent with Grade I diastolic dysfunction (impaired relaxation). Indeterminate filling pressures. Right Ventricle: RV not well visualized. Appears to have low normal function. There is ventricular septal dyssynchrony - may be due to bundle branch block. Negative McConnell's sign. The right ventricular size is normal. No increase in right ventricular wall thickness. Right ventricular systolic function is low normal. Left Atrium: Left atrial size was normal in size. Right Atrium: Right atrial size was normal in size. Pericardium: Trivial pericardial effusion is present.  The pericardial effusion is localized near the right ventricle. Mitral Valve: The mitral valve is grossly normal. Trivial mitral valve regurgitation. Tricuspid Valve: The tricuspid valve is not well visualized. Tricuspid valve regurgitation is not demonstrated. Aortic Valve: The aortic valve is tricuspid. Aortic valve regurgitation is not visualized. Pulmonic Valve: The pulmonic valve was grossly normal. Pulmonic valve regurgitation is not visualized. Aorta: The aortic root, ascending aorta, aortic arch and descending aorta are all structurally normal, with no evidence of dilitation or obstruction. Venous: The inferior vena cava is dilated in size with greater than  50% respiratory variability, suggesting right atrial pressure of 8 mmHg. IAS/Shunts: No atrial level shunt detected by color flow Doppler.  LEFT VENTRICLE PLAX 2D LVIDd:         5.30 cm  Diastology LVIDs:         3.70 cm  LV e' lateral:   7.40 cm/s LV PW:         1.00 cm  LV E/e' lateral: 10.5 LV IVS:        1.00 cm  LV e' medial:    5.98 cm/s LVOT diam:     2.10 cm  LV E/e' medial:  13.0 LV SV:         67 LV SV Index:   32 LVOT Area:     3.46 cm  RIGHT VENTRICLE RV S prime:     10.60 cm/s TAPSE (M-mode): 1.9 cm LEFT ATRIUM             Index LA diam:        3.80 cm 1.79 cm/m LA Vol (A2C):   42.8 ml 20.17 ml/m LA Vol (A4C):   42.4 ml 19.98 ml/m LA Biplane Vol: 44.3 ml 20.88 ml/m  AORTIC VALVE LVOT Vmax:   107.00 cm/s LVOT Vmean:  81.100 cm/s LVOT VTI:    0.194 m  AORTA Ao Root diam: 3.50 cm MITRAL VALVE MV Area (PHT): 4.63 cm     SHUNTS MV Decel Time: 164 msec     Systemic VTI:  0.19 m MV E velocity: 77.70 cm/s   Systemic Diam: 2.10 cm MV A velocity: 108.00 cm/s MV E/A ratio:  0.72 Lyman Bishop MD Electronically signed by Lyman Bishop MD Signature Date/Time: 04/21/2019/10:53:47 AM    Final    VAS [place] LOWER EXTREMITY VENOUS (DVT)  Result Date: 04/21/2019  Lower Venous DVTStudy Indications: Pulmonary embolism.  Comparison Study: no prior Performing Technologist: [name] RVS  Examination Guidelines: A complete evaluation includes B-mode imaging, spectral Doppler, color Doppler, and power Doppler as needed of all accessible portions of each vessel. Bilateral testing is considered an integral part of a complete examination. Limited examinations for reoccurring indications may be performed as noted. The reflux portion of the exam is performed with the patient in reverse Trendelenburg.  +---------+---------------+---------+-----------+----------+--------------+ RIGHT    CompressibilityPhasicitySpontaneityPropertiesThrombus Aging  +---------+---------------+---------+-----------+----------+--------------+ CFV      Full           Yes      Yes                                 +---------+---------------+---------+-----------+----------+--------------+ SFJ      Full                                                        +---------+---------------+---------+-----------+----------+--------------+  FV Prox  Full                                                        +---------+---------------+---------+-----------+----------+--------------+ FV Mid   Full                                                        +---------+---------------+---------+-----------+----------+--------------+ FV DistalFull                                                        +---------+---------------+---------+-----------+----------+--------------+ PFV      Full                                                        +---------+---------------+---------+-----------+----------+--------------+ POP      Full           Yes      Yes                                 +---------+---------------+---------+-----------+----------+--------------+ PTV      Full                                                        +---------+---------------+---------+-----------+----------+--------------+ PERO                                                  Not visualized +---------+---------------+---------+-----------+----------+--------------+   +---------+---------------+---------+-----------+----------+--------------+ LEFT     CompressibilityPhasicitySpontaneityPropertiesThrombus Aging +---------+---------------+---------+-----------+----------+--------------+ CFV      Full           Yes      Yes                                 +---------+---------------+---------+-----------+----------+--------------+ SFJ      Full                                                         +---------+---------------+---------+-----------+----------+--------------+ FV Prox  Full                                                        +---------+---------------+---------+-----------+----------+--------------+  FV Mid   Full                                                        +---------+---------------+---------+-----------+----------+--------------+ FV DistalFull                                                        +---------+---------------+---------+-----------+----------+--------------+ PFV      Full                                                        +---------+---------------+---------+-----------+----------+--------------+ POP      Full           Yes      Yes                                 +---------+---------------+---------+-----------+----------+--------------+ PTV                                                   Not visualized +---------+---------------+---------+-----------+----------+--------------+ PERO                                                  Not visualized +---------+---------------+---------+-----------+----------+--------------+     Summary: BILATERAL: - No evidence of deep vein thrombosis seen in the lower extremities, bilaterally.   *See table(s) above for measurements and observations.    Preliminary     Lab Data:  CBC: Recent Labs  Lab 04/20/19 2247 04/21/19 0510  WBC 14.9* 18.1*  HGB 15.3 14.5  HCT 46.3 42.4  MCV 93.3 92.0  PLT 287 527   Basic Metabolic Panel: Recent Labs  Lab 04/20/19 2247 04/21/19 0707  NA 131* 129*  K 4.4 4.5  CL 95* 94*  CO2 22 21*  GLUCOSE 357* 433*  BUN 20 17  CREATININE 1.34* 1.37*  CALCIUM 9.3 9.1   GFR: Estimated Creatinine Clearance: 46.9 mL/min (A) (by C-G formula based on SCr of 1.37 mg/dL (H)). Liver Function Tests: Recent Labs  Lab 04/20/19 2247 04/21/19 0707  AST 216* 326*  ALT 155* 223*  ALKPHOS 468* 472*  BILITOT 4.3* 6.8*  PROT 7.6 7.4    ALBUMIN 3.7 3.4*   Recent Labs  Lab 04/20/19 2247  LIPASE 39   No results for input(s): AMMONIA in the last 168 hours. Coagulation Profile: No results for input(s): INR, PROTIME in the last 168 hours. Cardiac Enzymes: No results for input(s): CKTOTAL, CKMB, CKMBINDEX, TROPONINI in the last 168 hours. BNP (last 3 results) No results for input(s): PROBNP in the last 8760 hours. HbA1C: Recent Labs    04/21/19 0534  HGBA1C 8.7*   CBG: Recent Labs  Lab 04/21/19 1159  GLUCAP 365*   Lipid Profile: No results for input(s): CHOL, HDL, LDLCALC, TRIG, CHOLHDL, LDLDIRECT in the last 72 hours. Thyroid Function Tests: No results for input(s): TSH, T4TOTAL, FREET4, T3FREE, THYROIDAB in the last 72 hours. Anemia Panel: No results for input(s): VITAMINB12, FOLATE, FERRITIN, TIBC, IRON, RETICCTPCT in the last 72 hours. Urine analysis:    Component Value Date/Time   COLORURINE AMBER (A) 04/21/2019 0552   APPEARANCEUR CLEAR 04/21/2019 0552   LABSPEC 1.035 (H) 04/21/2019 0552   PHURINE 5.0 04/21/2019 0552   GLUCOSEU >=500 (A) 04/21/2019 0552   HGBUR NEGATIVE 04/21/2019 [place] NEGATIVE 04/21/2019 0552   KETONESUR NEGATIVE 04/21/2019 0552   PROTEINUR NEGATIVE 04/21/2019 0552   NITRITE NEGATIVE 04/21/2019 0552   LEUKOCYTESUR NEGATIVE 04/21/2019 0552     Zyra Parrillo M.D. [facility] 04/21/2019, 2:42 PM   Call night coverage person covering after 7pm

## 2019-04-21 NOTE — ED Notes (Signed)
Provided patient a cup of water and his breakfast meal tray.

## 2019-04-21 NOTE — Progress Notes (Signed)
ANTICOAGULATION CONSULT NOTE - Initial Consult  Pharmacy Consult for Heparin Indication: pulmonary embolus  No Known Allergies  Patient Measurements: Height: 5\' 9"  (175.3 cm) Weight: 213 lb (96.6 kg) IBW/kg (Calculated) : 70.7 Heparin Dosing Weight: 90.8 kg  Vital Signs: Temp: 97.9 F (36.6 C) (03/10 1045) Temp Source: Oral (03/10 1045) BP: 132/96 (03/10 1215) Pulse Rate: 91 (03/10 1215)  Labs: Recent Labs    04/20/19 2247 04/21/19 0510 04/21/19 0707 04/21/19 1336  HGB 15.3 14.5  --   --   HCT 46.3 42.4  --   --   PLT 287 313  --   --   HEPARINUNFRC  --   --   --  0.54  CREATININE 1.34*  --  1.37*  --   TROPONINIHS 5 10 11   --     Estimated Creatinine Clearance: 46.9 mL/min (A) (by C-G formula based on SCr of 1.37 mg/dL (H)).   Medical History: Past Medical History:  Diagnosis Date  . A-fib ([place])   . Hypertension     Medications:  (Not in a hospital admission)  Infusions:  . heparin 1,350 Units/hr (04/21/19 0216)    Assessment: 86 y/o M admitted with bilateral PE without right heart strain on heparin infusion.   04/21/19 2:14 PM  - HL at goal - some dried blood at iv site per rn; no other bleeding or line issues per rn  Goal of Therapy:  Heparin level 0.3-0.7 units/ml Monitor platelets by anticoagulation protocol: Yes   Plan:  Continue heparin infusion at 1350 units/hr Recheck HL in 8 hours Daily HL/CBC Monitor for s/s of bleeding   Ulice Dash D 04/21/2019,2:06 PM

## 2019-04-21 NOTE — ED Notes (Signed)
Pt. Documented in error see above note in chart. 

## 2019-04-21 NOTE — H&P (Addendum)
History and Physical    Dennis Stephens UEA:[id number] DOB: 1933-02-28 DOA: 04/20/2019  PCP: [facility]  Patient coming from: Home.  Chief Complaint: Chest pain.  HPI: Dennis Stephens is a 86 y.o. male with history of hypertension hyperlipidemia BPH who has recently moved 9 months ago from [place] presents to the ER because of chest pain.  Patient states he has been having chest pain off and on for last 2 months which is acutely worsened over the last 24 hours.  Denies any productive cough fever or chills.  Chest pain is across the chest pressure-like nonradiating with no associated shortness of breath.  Patient has recently traveled to [place] in the car 2 weeks ago.  Has not noticed any leg swelling.  ED Course: In the ER patient had labs which showed elevated LFTs and creatinine around 1.3 WBC count of 14.9 high sensitive troponin was 5.  EKG shows sinus rhythm with RBBB.  Blood glucose is 357.  LFTs elevated AST 216 ALT 155.  Patient had CT angiogram of the chest and abdomen pelvis.  Shows bilateral pulmonary embolism with no strain.  In addition there were malignant appearing hilar and mediastinal lymphadenopathy with possible metastasis to liver.  Patient had CT head which was unremarkable patient was started on heparin for pulmonary embolism admit for further management of pulmonary embolism and possible metastatic disease.  Covid test is pending.  Review of Systems: As per HPI, rest all negative.   Past Medical History:  Diagnosis Date  . A-fib ([place])   . Hypertension     History reviewed. No pertinent surgical history.   reports that he has quit smoking. He has never used smokeless tobacco. He reports previous alcohol use. He reports previous drug use.  No Known Allergies  Family History  Problem Relation Age of Onset  . Diabetes Mellitus II Neg Hx     Prior to Admission medications   Medication Sig Start Date End Date Taking? Authorizing Provider  amLODipine  (NORVASC) 2.5 MG tablet Take 2.5 mg by mouth daily.   Yes [provider]  atorvastatin (LIPITOR) 40 MG tablet Take 40 mg by mouth daily.   Yes [provider]  carvedilol (COREG) 12.5 MG tablet Take 12.5 mg by mouth 2 (two) times daily with a meal.   Yes [provider]  doxazosin (CARDURA) 8 MG tablet Take 8 mg by mouth daily.   Yes [provider]  losartan (COZAAR) 100 MG tablet Take 100 mg by mouth daily.   Yes [provider]  omeprazole (PRILOSEC) 20 MG capsule Take 20 mg by mouth daily.   Yes [provider]  primidone (MYSOLINE) 50 MG tablet Take 100 mg by mouth every morning.   Yes [provider]  sertraline (ZOLOFT) 100 MG tablet Take 100 mg by mouth daily.   Yes [provider]  temazepam (RESTORIL) 30 MG capsule Take 30 mg by mouth at bedtime as needed for sleep.   Yes [provider]    Physical Exam: Constitutional: Moderately built and nourished. Vitals:   04/21/19 0015 04/21/19 0030 04/21/19 0230 04/21/19 0245  BP: (!) 155/85 (!) 178/70 (!) 171/66 (!) 164/62  Pulse: (!) 101 (!) 101 (!) 101 (!) 101  Resp:  (!) 28 (!) 24 (!) 25  Temp:      TempSrc:      SpO2: 92% 92% 90% 90%  Weight:      Height:       Eyes: Anicteric no  pallor. ENMT: No discharge from the ears eyes nose or mouth. Neck: No mass felt.  No neck rigidity. Respiratory: No rhonchi or crepitations. Cardiovascular: S1-S2 heard. Abdomen: Soft nontender bowel sounds present. Musculoskeletal: No edema. Skin: No rash. Neurologic: Alert awake oriented to time place and person.  Moves all extremities. Psychiatric: Appears normal per normal affect.   Labs on Admission: I have personally reviewed following labs and imaging studies  CBC: Recent Labs  Lab 04/20/19 2247  WBC 14.9*  HGB 15.3  HCT 46.3  MCV 93.3  PLT 948   Basic Metabolic Panel: Recent Labs  Lab 04/20/19 2247  NA 131*  K 4.4  CL 95*  CO2 22  GLUCOSE  357*  BUN 20  CREATININE 1.34*  CALCIUM 9.3   GFR: Estimated Creatinine Clearance: 47.9 mL/min (A) (by C-G formula based on SCr of 1.34 mg/dL (H)). Liver Function Tests: Recent Labs  Lab 04/20/19 2247  AST 216*  ALT 155*  ALKPHOS 468*  BILITOT 4.3*  PROT 7.6  ALBUMIN 3.7   Recent Labs  Lab 04/20/19 2247  LIPASE 39   No results for input(s): AMMONIA in the last 168 hours. Coagulation Profile: No results for input(s): INR, PROTIME in the last 168 hours. Cardiac Enzymes: No results for input(s): CKTOTAL, CKMB, CKMBINDEX, TROPONINI in the last 168 hours. BNP (last 3 results) No results for input(s): PROBNP in the last 8760 hours. HbA1C: No results for input(s): HGBA1C in the last 72 hours. CBG: No results for input(s): GLUCAP in the last 168 hours. Lipid Profile: No results for input(s): CHOL, HDL, LDLCALC, TRIG, CHOLHDL, LDLDIRECT in the last 72 hours. Thyroid Function Tests: No results for input(s): TSH, T4TOTAL, FREET4, T3FREE, THYROIDAB in the last 72 hours. Anemia Panel: No results for input(s): VITAMINB12, FOLATE, FERRITIN, TIBC, IRON, RETICCTPCT in the last 72 hours. Urine analysis: No results found for: COLORURINE, APPEARANCEUR, LABSPEC, PHURINE, GLUCOSEU, HGBUR, BILIRUBINUR, KETONESUR, PROTEINUR, UROBILINOGEN, NITRITE, LEUKOCYTESUR Sepsis Labs: @LABRCNTIP (procalcitonin:4,lacticidven:4) )No results found for this or any previous visit (from the past 240 hour(s)).   Radiological Exams on Admission: DG Chest 2 View  Result Date: 04/20/2019 CLINICAL DATA:  Chest pain for several hours, history of lung carcinoma EXAM: CHEST - 2 VIEW COMPARISON:  01/20/2019 CT FINDINGS: Cardiac shadow is within normal limits. There is a rounded nodular density identified in the left lung measuring 3.3 cm which corresponds to that seen on the prior exam at which time it measured 1.8 cm. Patchy changes are noted in the right upper lobe also increased when compared with the prior exam. No  sizable effusion is noted. No bony abnormality is noted. IMPRESSION: Enlarging bilateral upper lobe nodules as described. This is consistent with the patient's given clinical history Electronically Signed   By: Inez Catalina M.D.   On: 04/20/2019 21:56   CT Head Wo Contrast  Result Date: 04/21/2019 CLINICAL DATA:  86 year old male with pulmonary emboli, malignant appearing mediastinal lymphadenopathy and liver lesion on CT yesterday. Query brain Mets. EXAM: CT HEAD WITHOUT CONTRAST TECHNIQUE: Contiguous axial images were obtained from the base of the skull through the vertex without intravenous contrast. COMPARISON:  No prior head imaging. FINDINGS: Brain: IV contrast was administered for chest abdomen and pelvis CT 1 hour before these images. Patchy bilateral white matter hypodensity, but no areas suspicious for vasogenic edema. No midline shift, mass effect, or evidence of intracranial mass lesion. No abnormal enhancement identified. No ventriculomegaly. No cortical encephalomalacia identified. No cortically based acute infarct identified. There is a  small hypodensity in the posterior right cerebellum on series 2, image 7, probably a small chronic cerebellar infarct. Vascular: Some residual intravascular contrast is present as above. The major intracranial vascular structures seem to be enhancing as expected. Skull: No acute or suspicious osseous lesion identified. Sinuses/Orbits: Visualized paranasal sinuses and mastoids are clear. Other: No acute orbit or scalp soft tissue findings. IMPRESSION: 1. No metastatic disease to the brain is identified by CT with very delayed contrast timing. MRI Head without and with contrast would be most sensitive. 2. No acute intracranial abnormality identified. 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 Electronically Signed   By: Genevie Ann M.D.   On: 04/21/2019 01:08   CT Angio Chest PE W and/or Wo  Contrast  Result Date: 04/21/2019 CLINICAL DATA:  Non-specific chest pain and abdominal pain since 1500 hours. History of hypertension and atrial fibrillation. EXAM: CT ANGIOGRAPHY CHEST CT ABDOMEN AND PELVIS WITH CONTRAST TECHNIQUE: Multidetector CT imaging of the chest was performed using the standard protocol during bolus administration of intravenous contrast. Multiplanar CT image reconstructions and MIPs were obtained to evaluate the vascular anatomy. Multidetector CT imaging of the abdomen and pelvis was performed using the standard protocol during bolus administration of intravenous contrast. Multidetector CT imaging of the chest was performed using the standard protocol during bolus administration of intravenous contrast. Multiplanar CT image reconstructions and MIPs were obtained to evaluate the vascular anatomy. Automatic exposure control utilized. CONTRAST:  122mL OMNIPAQUE IOHEXOL 350 MG/ML SOLN COMPARISON:  January 20, 2019. FINDINGS: CTA CHEST FINDINGS Cardiovascular: Pulmonary emboli in the bilateral proximal lobar pulmonary arteries and more peripheral bilateral pulmonary arteries. No central pulmonary artery embolism. Right ventricular to left ventricular ratio 0.7; no right heart strain. Mild cardiomegaly with fatty hypertrophy of the inter atrial septum and four-vessel moderate to severe coronary calcification. Mediastinum/Nodes: Metastatic appearing right paratracheal conglomerate adenopathy measuring 3.2 by 2.4 cm. Metastatic-appearing superior mediastinal lymph node measuring 23 by 16 mm. Metastatic appearing right hilar lymph node measuring 16 by 22 mm. Additional smaller mediastinal and bilateral hilar lymph nodes that could be metastatic or benign. Small hiatal hernia. Lungs/Pleura: No pleural effusion. Bilateral spiculated upper lobe pulmonary nodules measuring 2.4 cm diameter in the left upper lobe on series 2, image 39, and 2.6 x 2.0 cm in the right upper lobe on image 26. Both nodules  have increased in size is since December 2020 at which time they measured 19 x 18 mm on the left, and 8 mm diameter on the right. Respiratory motion artifact Upper Abdomen: Reported on concomitant CT abdomen and pelvis reported above. Musculoskeletal: Bone demineralization. Moderate skeletal degenerative change. A 6 mm sclerotic lesion in the T12 vertebral body stable compared to December 2020. Review of the MIP images confirms the above findings. CT ABDOMEN and PELVIS FINDINGS Hepatobiliary: A new 14 mm hypoattenuating lesion in the right hemi liver, highly worrisome for metastasis. Chronic mild intrahepatic and 14 mm proximal common bile duct dilatation with normal distal tapering. Normal gallbladder. Pancreas: Normal. Spleen: Normal. Adrenals/Urinary Tract: Normal bilateral adrenal glands. Bilateral medical renal disease and simple appearing renal cortical cysts measuring up to 4.4 cm on the right. No hydronephrosis or nephroureterolithiasis bilaterally. No apparent urinary bladder abnormality. A dense 10 mm right renal midpole hypoattenuation. Stomach/Bowel: No bowel obstruction. Moderate stool burden and diverticulosis coli without an acute diverticulitis or apparent bowel wall thickening. Normal appendix, axial image 60. Vascular/Lymphatic: Aortic bilateral iliac in major abdominal artery  calcified atherosclerosis with probably clinically significant 75% narrowing of the left common iliac artery. No abdominal aortic aneurysm. Small nonspecific retrocrural, gastrohepatic ligament and retroperitoneal lymph nodes. A mildly enlarged periportal lymph node measuring 12 x 16 mm. No abdominal aortic dissection. Reproductive: Mild prostatomegaly. Other: None. Musculoskeletal: Moderate skeletal degenerative change. A 9 mm ovoid sclerotic lesion in the right proximal femoral shaft, unchanged. Review of the MIP images confirms the above findings. I discussed critical Value/emergent results by telephone at the time of  interpretation on 04/21/2019 at 12:12 am with provider Providence Lanius , who verbally acknowledged these results. IMPRESSION: Acute bilateral lobar and peripheral pulmonary emboli without right heart strain. No thoracoabdominal aortic dissection. Suspicious bilateral upper lobe solid pulmonary nodules, both increased in size since December 2020. Histopathological correlation recommended. An embolic or infectious etiology are less likely differential considerations. Malignant-appearing mediastinal and right hilar adenopathy. Enlarged nonspecific gastrohepatic node. Additional small nonspecific lymph nodes above and below diaphragm which could be metastatic or benign. New hepatic lesion concerning for metastatic disease. Normal bilateral adrenal glands. A new 10 mm dense right renal midpole lesion; renal ultrasonography recommended. A proteinaceous or hemorrhagic cortical cyst or malignant renal lesion are differential considerations. Mild cardiomegaly with four-vessel moderate to severe coronary calcification. Small hiatal hernia. Stable 6 mm T12 and 9 mm right proximal femoral shaft sclerotic lesions since December 2020. Thoracoabdominal aorta and major abdominal artery calcified atherosclerosis without aneurysm. Probably clinically significant 75% atherosclerotic narrowing of the right common iliac artery. Diverticulosis coli. Chronic biliary dilatation. Electronically Signed   By: Revonda Humphrey   On: 04/21/2019 00:17   CT ABDOMEN PELVIS W CONTRAST  Result Date: 04/21/2019 CLINICAL DATA:  Non-specific chest pain and abdominal pain since 1500 hours. History of hypertension and atrial fibrillation. EXAM: CT ANGIOGRAPHY CHEST CT ABDOMEN AND PELVIS WITH CONTRAST TECHNIQUE: Multidetector CT imaging of the chest was performed using the standard protocol during bolus administration of intravenous contrast. Multiplanar CT image reconstructions and MIPs were obtained to evaluate the vascular anatomy. Multidetector CT  imaging of the abdomen and pelvis was performed using the standard protocol during bolus administration of intravenous contrast. Multidetector CT imaging of the chest was performed using the standard protocol during bolus administration of intravenous contrast. Multiplanar CT image reconstructions and MIPs were obtained to evaluate the vascular anatomy. Automatic exposure control utilized. CONTRAST:  142mL OMNIPAQUE IOHEXOL 350 MG/ML SOLN COMPARISON:  January 20, 2019. FINDINGS: CTA CHEST FINDINGS Cardiovascular: Pulmonary emboli in the bilateral proximal lobar pulmonary arteries and more peripheral bilateral pulmonary arteries. No central pulmonary artery embolism. Right ventricular to left ventricular ratio 0.7; no right heart strain. Mild cardiomegaly with fatty hypertrophy of the inter atrial septum and four-vessel moderate to severe coronary calcification. Mediastinum/Nodes: Metastatic appearing right paratracheal conglomerate adenopathy measuring 3.2 by 2.4 cm. Metastatic-appearing superior mediastinal lymph node measuring 23 by 16 mm. Metastatic appearing right hilar lymph node measuring 16 by 22 mm. Additional smaller mediastinal and bilateral hilar lymph nodes that could be metastatic or benign. Small hiatal hernia. Lungs/Pleura: No pleural effusion. Bilateral spiculated upper lobe pulmonary nodules measuring 2.4 cm diameter in the left upper lobe on series 2, image 39, and 2.6 x 2.0 cm in the right upper lobe on image 26. Both nodules have increased in size is since December 2020 at which time they measured 19 x 18 mm on the left, and 8 mm diameter on the right. Respiratory motion artifact Upper Abdomen: Reported on concomitant CT abdomen and pelvis reported above. Musculoskeletal: Bone demineralization.  Moderate skeletal degenerative change. A 6 mm sclerotic lesion in the T12 vertebral body stable compared to December 2020. Review of the MIP images confirms the above findings. CT ABDOMEN and PELVIS  FINDINGS Hepatobiliary: A new 14 mm hypoattenuating lesion in the right hemi liver, highly worrisome for metastasis. Chronic mild intrahepatic and 14 mm proximal common bile duct dilatation with normal distal tapering. Normal gallbladder. Pancreas: Normal. Spleen: Normal. Adrenals/Urinary Tract: Normal bilateral adrenal glands. Bilateral medical renal disease and simple appearing renal cortical cysts measuring up to 4.4 cm on the right. No hydronephrosis or nephroureterolithiasis bilaterally. No apparent urinary bladder abnormality. A dense 10 mm right renal midpole hypoattenuation. Stomach/Bowel: No bowel obstruction. Moderate stool burden and diverticulosis coli without an acute diverticulitis or apparent bowel wall thickening. Normal appendix, axial image 60. Vascular/Lymphatic: Aortic bilateral iliac in major abdominal artery calcified atherosclerosis with probably clinically significant 75% narrowing of the left common iliac artery. No abdominal aortic aneurysm. Small nonspecific retrocrural, gastrohepatic ligament and retroperitoneal lymph nodes. A mildly enlarged periportal lymph node measuring 12 x 16 mm. No abdominal aortic dissection. Reproductive: Mild prostatomegaly. Other: None. Musculoskeletal: Moderate skeletal degenerative change. A 9 mm ovoid sclerotic lesion in the right proximal femoral shaft, unchanged. Review of the MIP images confirms the above findings. I discussed critical Value/emergent results by telephone at the time of interpretation on 04/21/2019 at 12:12 am with provider Providence Lanius , who verbally acknowledged these results. IMPRESSION: Acute bilateral lobar and peripheral pulmonary emboli without right heart strain. No thoracoabdominal aortic dissection. Suspicious bilateral upper lobe solid pulmonary nodules, both increased in size since December 2020. Histopathological correlation recommended. An embolic or infectious etiology are less likely differential considerations.  Malignant-appearing mediastinal and right hilar adenopathy. Enlarged nonspecific gastrohepatic node. Additional small nonspecific lymph nodes above and below diaphragm which could be metastatic or benign. New hepatic lesion concerning for metastatic disease. Normal bilateral adrenal glands. A new 10 mm dense right renal midpole lesion; renal ultrasonography recommended. A proteinaceous or hemorrhagic cortical cyst or malignant renal lesion are differential considerations. Mild cardiomegaly with four-vessel moderate to severe coronary calcification. Small hiatal hernia. Stable 6 mm T12 and 9 mm right proximal femoral shaft sclerotic lesions since December 2020. Thoracoabdominal aorta and major abdominal artery calcified atherosclerosis without aneurysm. Probably clinically significant 75% atherosclerotic narrowing of the right common iliac artery. Diverticulosis coli. Chronic biliary dilatation. Electronically Signed   By: Revonda Humphrey   On: 04/21/2019 00:17    EKG: Independently reviewed.  Normal sinus rhythm with RBBB.  Assessment/Plan Principal Problem:   Pulmonary embolism (HCC) Active Problems:   Lung mass   Essential hypertension   Elevated LFTs   ARF (acute renal failure) ([place])    1. Bilateral pulmonary embolism with no strain pattern -given that patient has possible malignancy could be the provoking factor.  For now patient is on a heparin infusion.  Will check 2D echo Dopplers of the lower extremity segment cardiac markers. 2. Malignant appearing hilar and mediastinal lymphadenopathy with possible liver metastasis and other changes seen in the CAT scan concerning for metastasis will need possible biopsy and oncology input. 3. Elevated LFTs could be from possible metastasis.  Also shows chronic dilated ducts.  Will follow LFTs check acute hepatitis panel.  Hold statin. 4. Hypertension on amlodipine Coreg and ARB.  I am holding off ARB for now since patient is a contrast will restart it if  there is no worsening of creatinine.  As needed IV hydralazine. 5. Chronic kidney disease stage  III creatinine appears to be at baseline when compared to the one in December 2020.  Note that patient is on ARB which is on hold for now since patient is a contrast.  6. Hyperglycemia -reviewing patient's labs done over the last 3 months patient has persistent hyperglycemia more than 200.  Suspect patient likely has diabetes mellitus.  Will check hemoglobin A1c and evaluated we will keep patient on sliding scale coverage and carb modified diet. 7. Three-vessel disease of the heart seen in the CAT scan.  8. Hyperlipidemia on statins which will be held due to elevated LFTs.  Covid test is pending.   DVT prophylaxis: Heparin infusion. Code Status: DNR as confirmed with patient. Family Communication: Discussed with patient. Disposition Plan: Home. Consults called: None. Admission status: Observation.   Rise Patience MD [facility] Pager [phone].  If 7PM-7AM, please contact night-coverage [email or web address] Password [email or web address]  04/21/2019, 4:03 AM

## 2019-04-22 DIAGNOSIS — I2699 Other pulmonary embolism without acute cor pulmonale: Principal | ICD-10-CM

## 2019-04-22 DIAGNOSIS — R918 Other nonspecific abnormal finding of lung field: Secondary | ICD-10-CM

## 2019-04-22 LAB — CBC
HCT: 38.1 % — ABNORMAL LOW (ref 39.0–52.0)
Hemoglobin: 12.9 g/dL — ABNORMAL LOW (ref 13.0–17.0)
MCH: 30.8 pg (ref 26.0–34.0)
MCHC: 33.9 g/dL (ref 30.0–36.0)
MCV: 90.9 fL (ref 80.0–100.0)
Platelets: 238 10*3/uL (ref 150–400)
RBC: 4.19 MIL/uL — ABNORMAL LOW (ref 4.22–5.81)
RDW: 14 % (ref 11.5–15.5)
WBC: 14.9 10*3/uL — ABNORMAL HIGH (ref 4.0–10.5)
nRBC: 0 % (ref 0.0–0.2)

## 2019-04-22 LAB — BASIC METABOLIC PANEL
Anion gap: 11 (ref 5–15)
BUN: 32 mg/dL — ABNORMAL HIGH (ref 8–23)
CO2: 20 mmol/L — ABNORMAL LOW (ref 22–32)
Calcium: 8.5 mg/dL — ABNORMAL LOW (ref 8.9–10.3)
Chloride: 96 mmol/L — ABNORMAL LOW (ref 98–111)
Creatinine, Ser: 1.46 mg/dL — ABNORMAL HIGH (ref 0.61–1.24)
GFR calc Af Amer: 51 mL/min — ABNORMAL LOW (ref >60)
GFR calc non Af Amer: 44 mL/min — ABNORMAL LOW (ref >60)
Glucose, Bld: 206 mg/dL — ABNORMAL HIGH (ref 70–99)
Potassium: 4.3 mmol/L (ref 3.5–5.1)
Sodium: 127 mmol/L — ABNORMAL LOW (ref 135–145)

## 2019-04-22 LAB — GLUCOSE, CAPILLARY
Glucose-Capillary: 204 mg/dL — ABNORMAL HIGH (ref 70–99)
Glucose-Capillary: 190 mg/dL — ABNORMAL HIGH (ref 70–99)

## 2019-04-22 LAB — HEPARIN LEVEL (UNFRACTIONATED): Heparin Unfractionated: 0.69 IU/mL (ref 0.30–0.70)

## 2019-04-22 MED ORDER — RIVAROXABAN (XARELTO) VTE STARTER PACK (15 & 20 MG): ORAL_TABLET | ORAL | 0 refills | Status: DC

## 2019-04-22 MED ORDER — LORATADINE 10 MG PO TABS: 10.0000 mg | ORAL_TABLET | Freq: Every day | ORAL | 4 refills | Status: AC | PRN

## 2019-04-22 MED ORDER — RIVAROXABAN 20 MG PO TABS: 20.0000 mg | ORAL_TABLET | Freq: Every day | ORAL | 3 refills | Status: DC

## 2019-04-22 MED ORDER — GLIMEPIRIDE 2 MG PO TABS: 2.0000 mg | ORAL_TABLET | Freq: Every day | ORAL | 11 refills | Status: AC

## 2019-04-22 MED ORDER — RIVAROXABAN 15 MG PO TABS
15.0000 mg | ORAL_TABLET | Freq: Two times a day (BID) | ORAL | Status: DC
  Administered 2019-04-22: 15 mg via ORAL
  Filled 2019-04-22: qty 1

## 2019-04-22 MED ORDER — LIVING WELL WITH DIABETES BOOK
Freq: Once | Status: AC
  Filled 2019-04-22: qty 1

## 2019-04-22 MED ORDER — RIVAROXABAN 20 MG PO TABS: 20.0000 mg | ORAL_TABLET | Freq: Every day | ORAL | Status: DC

## 2019-04-22 NOTE — Discharge Instructions (Signed)
Information on my medicine - XARELTO (rivaroxaban)  This medication education was reviewed with me or my healthcare representative as part of my discharge preparation.  WHY WAS XARELTO PRESCRIBED FOR YOU? Xarelto was prescribed to treat blood clots that may have been found in the veins of your legs (deep vein thrombosis) or in your lungs (pulmonary embolism) and to reduce the risk of them occurring again.  What do you need to know about Xarelto? The starting dose is one 15 mg tablet taken TWICE daily with food for the FIRST 21 DAYS then on (enter date)  05/13/19  the dose is changed to one 20 mg tablet taken ONCE A DAY with your evening meal.  DO NOT stop taking Xarelto without talking to the health care provider who prescribed the medication.  Refill your prescription for 20 mg tablets before you run out.  After discharge, you should have regular check-up appointments with your healthcare provider that is prescribing your Xarelto.  In the future your dose may need to be changed if your kidney function changes by a significant amount.  What do you do if you miss a dose? If you are taking Xarelto TWICE DAILY and you miss a dose, take it as soon as you remember. You may take two 15 mg tablets (total 30 mg) at the same time then resume your regularly scheduled 15 mg twice daily the next day.  If you are taking Xarelto ONCE DAILY and you miss a dose, take it as soon as you remember on the same day then continue your regularly scheduled once daily regimen the next day. Do not take two doses of Xarelto at the same time.   Important Safety Information Xarelto is a blood thinner medicine that can cause bleeding. You should call your healthcare provider right away if you experience any of the following: ? Bleeding from an injury or your nose that does not stop. ? Unusual colored urine (red or dark brown) or unusual colored stools (red or black). ? Unusual bruising for unknown reasons. ? A  serious fall or if you hit your head (even if there is no bleeding).  Some medicines may interact with Xarelto and might increase your risk of bleeding while on Xarelto. To help avoid this, consult your healthcare provider or pharmacist prior to using any new prescription or non-prescription medications, including herbals, vitamins, non-steroidal anti-inflammatory drugs (NSAIDs) and supplements.  This website has more information on Xarelto: [email or web address]

## 2019-04-22 NOTE — TOC Initial Note (Signed)
Transition of Care [facility]) - Initial/Assessment Note    Patient Details  Name: Dennis Stephens MRN: [id number] Date of Birth: Aug 25, 1933  [facility] CM/SW Contact:    Trish Mage, LCSW Phone Number: 04/22/2019, 9:46 AM  Clinical Narrative:    CSW responded to Dr consult order for medication verbally in AM progression. No further needs identified. TOC will continue to follow during the course of hospitalization.                 Expected Discharge Plan: Home/Self Care Barriers to Discharge: No Barriers Identified   Patient Goals and CMS Choice        Expected Discharge Plan and Services Expected Discharge Plan: Home/Self Care                                              Prior Living Arrangements/Services                       Activities of Daily Living Home Assistive Devices/Equipment: Dentures (specify type), Eyeglasses(upper/lower dentures, reading glasses) ADL Screening (condition at time of admission) Patient's cognitive ability adequate to safely complete daily activities?: Yes Is the patient deaf or have difficulty hearing?: No Does the patient have difficulty seeing, even when wearing glasses/contacts?: No Does the patient have difficulty concentrating, remembering, or making decisions?: No Patient able to express need for assistance with ADLs?: Yes Does the patient have difficulty dressing or bathing?: No Independently performs ADLs?: Yes (appropriate for developmental age) Does the patient have difficulty walking or climbing stairs?: Yes Weakness of Legs: Both Weakness of Arms/Hands: None  Permission Sought/Granted                  Emotional Assessment              Admission diagnosis:  Pulmonary embolism ([place]) [I26.99] Pulmonary nodule [R91.1] Metastasis ([place]) [C79.9] Chest pain, unspecified type [R07.9] Other acute pulmonary embolism, unspecified whether acute cor pulmonale present ([place]) [I26.99] Patient  Active Problem List   Diagnosis Date Noted  . Pulmonary embolism ([place]) 04/21/2019  . Lung mass 04/21/2019  . Essential hypertension 04/21/2019  . Elevated LFTs 04/21/2019  . ARF (acute renal failure) ([place]) 04/21/2019   PCP:  [place]:   [place], [place] - [phone] [place] [address] Phone: [phone] Fax: [phone]  [facility] [place], [address] [place] [address] Phone: [phone] Fax: [phone]     Social Determinants of Health (SDOH) Interventions    Readmission Risk Interventions No flowsheet data found.

## 2019-04-22 NOTE — Progress Notes (Signed)
ANTICOAGULATION CONSULT NOTE - Follow Up Consult  Pharmacy Consult for Heparin Indication: pulmonary embolus  No Known Allergies  Patient Measurements: Height: 5\' 9"  (175.3 cm) Weight: 213 lb (96.6 kg) IBW/kg (Calculated) : 70.7 Heparin Dosing Weight: 91kg  Vital Signs: Temp: 98.5 F (36.9 C) (03/11 0536) Temp Source: Oral (03/11 0536) BP: 107/67 (03/11 0536) Pulse Rate: 73 (03/11 0536)  Labs: Recent Labs    04/20/19 2247 04/21/19 0510 04/21/19 0707 04/21/19 1336 04/21/19 2125  HGB 15.3 14.5  --   --   --   HCT 46.3 42.4  --   --   --   PLT 287 313  --   --   --   HEPARINUNFRC  --   --   --  0.54 0.87*  CREATININE 1.34*  --  1.37*  --   --   TROPONINIHS 5 10 11   --   --     Estimated Creatinine Clearance: 46.9 mL/min (A) (by C-G formula based on SCr of 1.37 mg/dL (H)).   Medications:  Infusions:  . heparin 1,250 Units/hr (04/21/19 2319)    Assessment: 15 yoM admitted on 3/9 with bilateral PE.  Pharmacy has been consulted to dose IV heparin.    Today, 04/22/2019: Heparin level decreased to 0.69, therapeutic on heparin 1250 units/hr. CBC: Hgb decreased to 12.9, Plt 238k.  No bleeding or complications reported.   Goal of Therapy:  Heparin level 0.3-0.7 units/ml Monitor platelets by anticoagulation protocol: Yes   Plan:  Continue heparin IV infusion at 1250 units/hr Confirmatory heparin level in 8 hours. Daily heparin level and CBC Hold heparin at midnight on 3/11 for bronch on 3[facility] at [facility]. Per PCCM note, can transition to [place] after bronch.  Follow up orders.   Gretta Arab PharmD, BCPS Clinical pharmacist phone 7am- 5pm: [phone] 04/22/2019 7:48 AM

## 2019-04-22 NOTE — Progress Notes (Signed)
Went over discharge information w/ patient. Patient agreed w/ plan. Patient is waiting for his wife to come pick him up.

## 2019-04-22 NOTE — Discharge Summary (Signed)
Physician Discharge Summary   Patient ID: Luca Dyar MRN: [id number] DOB/AGE: 03/15/1933 86 y.o.  Admit date: 04/20/2019 Discharge date: 04/22/2019  Primary Care Physician:  Stewart   Recommendations for Outpatient Follow-up:  1. Follow up with PCP in 1-2 weeks 2. Patient declined bronchoscopy and further work-up of the lung mass here at [facility].  He prefers to discuss it with his primary physician at [facility] and proceed there. 3. Started on Xarelto 15 mg twice a day for 21 days then continue 20 mg daily for PE 4.   Home Health: Patient at baseline Equipment/Devices:   Discharge Condition: stable  CODE STATUS:  DNR   Diet recommendation: Heart healthy diet   Discharge Diagnoses:    . Acute bilateral pulmonary embolism ([place]) . Hilar and mediastinal lymphadenopathy, spiculated pulmonary nodules . Essential hypertension . Elevated LFTs . Chronic kidney disease stage III Obesity  Consults:   Interventional radiology Pulmonology    Allergies:  No Known Allergies   DISCHARGE MEDICATIONS: Allergies as of 04/22/2019   No Known Allergies     Medication List    TAKE these medications   amLODipine 2.5 MG tablet Commonly known as: NORVASC Take 2.5 mg by mouth daily.   atorvastatin 40 MG tablet Commonly known as: LIPITOR Take 40 mg by mouth daily.   carvedilol 12.5 MG tablet Commonly known as: COREG Take 12.5 mg by mouth 2 (two) times daily with a meal.   doxazosin 8 MG tablet Commonly known as: CARDURA Take 8 mg by mouth daily.   glimepiride 2 MG tablet Commonly known as: Amaryl Take 1 tablet (2 mg total) by mouth daily with breakfast.   loratadine 10 MG tablet Commonly known as: CLARITIN Take 1 tablet (10 mg total) by mouth daily as needed for allergies or rhinitis.   losartan 100 MG tablet Commonly known as: COZAAR Take 100 mg by mouth daily.   omeprazole 20 MG capsule Commonly known as: PRILOSEC Take 20 mg by  mouth daily.   primidone 50 MG tablet Commonly known as: MYSOLINE Take 100 mg by mouth every morning.   Rivaroxaban 15 & 20 MG Tbpk Follow package directions: Take one 15mg  tablet by mouth twice a day. On day 22 (05/13/19), switch to one 20mg  tablet once a day. Take with food.   rivaroxaban 20 MG Tabs tablet Commonly known as: XARELTO Take 1 tablet (20 mg total) by mouth daily with supper. Start once the starter pack has been completed Start taking on: May 13, 2019   sertraline 100 MG tablet Commonly known as: ZOLOFT Take 100 mg by mouth daily.   temazepam 30 MG capsule Commonly known as: RESTORIL Take 30 mg by mouth at bedtime as needed for sleep.        Brief H and P: For complete details please refer to admission H and P, but in brief *Patient is 86 year old male with history of hypertension, hyperlipidemia, BPH who recently moved 9 months ago from [place] presented to the ER for chest pain.  Patient reported he was having off-and-on chest pain for last 2 months, acutely worsened over the 24 hours prior to admission.  No productive cough, fevers or chills, no associated shortness of breath.  Patient had recently traveled to [place] in the car 2 weeks prior to admission. In ED, labs showed LFTs elevated, creatinine 1.3, CT angiogram of the chest abdomen pelvis showed bilateral pulmonary embolism, no strain, hilar and mediastinal lymphadenopathy with possible mets to liver.  Patient was started on heparin drip  COVID-19 test negative  Hospital Course:   Acute bilateral pulmonary embolism ([place]) -PE likely has been caused by the long distance car drive versus underlying malignancy -Doppler ultrasound lower extremity showed no DVT bilaterally -2D echo showed EF of 55 to 49%, grade 1 diastolic dysfunction, low normal RV function -Patient was started on IV heparin drip.  Plan was to do EBUS for tissue diagnosis of the spiculated pulmonary nodule however patient declined any further  work-up and the procedure here.  Hence he was transitioned to Xarelto for anticoagulation.  Patient will follow up with his primary physician at [facility]  Active Problems: Hilar and mediastinal lymphadenopathy with possible liver metastasis, spiculated pulmonary nodule -CT chest abdomen pelvis showed suspicious bilateral upper lobe solid pulmonary nodules, 2.4 cm in the left upper lobe, 2.6X 2.0 in the right upper lobe, both nodules increased in size since December 2020.  Metastatic appearing right paratracheal adenopathy, superior mediastinal lymphadenopathy, metastatic appearance of right hilar lymph node, new hepatic lesions -IR was consulted, recommended bronchoscopy.    Pulmonology was consulted, EBUS and navigational bronchoscopy for biopsy was scheduled on 04/23/2019. -Patient decided not to have the procedure here and wants to discuss with his primary physician at [facility].  This was discussed with patient's wife as well who agreed with patient's decision.  Procedure was canceled, patient requested discharge home on anticoagulation    Essential hypertension -BP currently stable, continue continue outpatient antihypertensives     Elevated LFTs -Likely due to liver mets, -Hold statin, hepatitis panel negative  Chronic kidney disease stage III -Creatinine appears to be at baseline, hold ARB for now  Type 2 diabetes mellitus, new diagnosis  -CBG 433 on admission -Hemoglobin A1c 8.7 -He was placed on sliding scale insulin and carb modified diet.  Diabetic coordinator consult was obtained -Patient was discharged on Amaryl 2 mg daily and follow-up closely with his primary physician on  Obesity Estimated body mass index is 31.45 kg/m as calculated from the following:   Height as of this encounter: 5\' 9"  (1.753 m).   Weight as of this encounter: 96.6 kg  Hyponatremia -Pseudohyponatremia due to hyperglycemia and possible SIADH, patient is tolerating diet without  any difficulty   Day of Discharge S: Patient has declined to go for the bronchoscopy procedure and wants to go home.  He wants to follow-up with his primary physician at [facility] for further work-up.  He is fully alert and oriented, he also discussed the same with his wife during the encounter.  BP (!) 122/57 (BP Location: Left Arm)   Pulse 69   Temp 98.5 F (36.9 C) (Oral)   Resp 20   Ht 5\' 9"  (1.753 m)   Wt 96.6 kg   SpO2 95%   BMI 31.45 kg/m   Physical Exam: General: Alert and awake oriented x3 not in any acute distress. HEENT: anicteric sclera, pupils reactive to light and accommodation CVS: S1-S2 clear no murmur rubs or gallops Chest: clear to auscultation bilaterally, no wheezing rales or rhonchi Abdomen: soft nontender, nondistended, normal bowel sounds Extremities: no cyanosis, clubbing or edema noted bilaterally Neuro: Cranial nerves II-XII intact, no focal neurological deficits    Get Medicines reviewed and adjusted: Please take all your medications with you for your next visit with your Primary MD  Please request your Primary MD to go over all hospital tests and procedure/radiological results at the follow up. Please ask your Primary MD to  get all Hospital records sent to his/her office.  If you experience worsening of your admission symptoms, develop shortness of breath, life threatening emergency, suicidal or homicidal thoughts you must seek medical attention immediately by calling 911 or calling your MD immediately  if symptoms less severe.  You must read complete instructions/literature along with all the possible adverse reactions/side effects for all the Medicines you take and that have been prescribed to you. Take any new Medicines after you have completely understood and accept all the possible adverse reactions/side effects.   Do not drive when taking pain medications.   Do not take more than prescribed Pain, Sleep and Anxiety Medications  Special  Instructions: If you have smoked or chewed Tobacco  in the last 2 yrs please stop smoking, stop any regular Alcohol  and or any Recreational drug use.  Wear Seat belts while driving.  Please note  You were cared for by a hospitalist during your hospital stay. Once you are discharged, your primary care physician will handle any further medical issues. Please note that NO REFILLS for any discharge medications will be authorized once you are discharged, as it is imperative that you return to your primary care physician (or establish a relationship with a primary care physician if you do not have one) for your aftercare needs so that they can reassess your need for medications and monitor your lab values.   The results of significant diagnostics from this hospitalization (including imaging, microbiology, ancillary and laboratory) are listed below for reference.      Procedures/Studies:  DG Chest 2 View  Result Date: 04/20/2019 CLINICAL DATA:  Chest pain for several hours, history of lung carcinoma EXAM: CHEST - 2 VIEW COMPARISON:  01/20/2019 CT FINDINGS: Cardiac shadow is within normal limits. There is a rounded nodular density identified in the left lung measuring 3.3 cm which corresponds to that seen on the prior exam at which time it measured 1.8 cm. Patchy changes are noted in the right upper lobe also increased when compared with the prior exam. No sizable effusion is noted. No bony abnormality is noted. IMPRESSION: Enlarging bilateral upper lobe nodules as described. This is consistent with the patient's given clinical history Electronically Signed   By: Inez Catalina M.D.   On: 04/20/2019 21:56   CT Head Wo Contrast  Result Date: 04/21/2019 CLINICAL DATA:  86 year old male with pulmonary emboli, malignant appearing mediastinal lymphadenopathy and liver lesion on CT yesterday. Query brain Mets. EXAM: CT HEAD WITHOUT CONTRAST TECHNIQUE: Contiguous axial images were obtained from the base of the  skull through the vertex without intravenous contrast. COMPARISON:  No prior head imaging. FINDINGS: Brain: IV contrast was administered for chest abdomen and pelvis CT 1 hour before these images. Patchy bilateral white matter hypodensity, but no areas suspicious for vasogenic edema. No midline shift, mass effect, or evidence of intracranial mass lesion. No abnormal enhancement identified. No ventriculomegaly. No cortical encephalomalacia identified. No cortically based acute infarct identified. There is a small hypodensity in the posterior right cerebellum on series 2, image 7, probably a small chronic cerebellar infarct. Vascular: Some residual intravascular contrast is present as above. The major intracranial vascular structures seem to be enhancing as expected. Skull: No acute or suspicious osseous lesion identified. Sinuses/Orbits: Visualized paranasal sinuses and mastoids are clear. Other: No acute orbit or scalp soft tissue findings. IMPRESSION: 1. No metastatic disease to the brain is identified by CT with very delayed contrast timing. MRI Head without and with contrast would be  most sensitive. 2. No acute intracranial abnormality identified. 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 Electronically Signed   By: Genevie Ann M.D.   On: 04/21/2019 01:08   CT Angio Chest PE W and/or Wo Contrast  Result Date: 04/21/2019 CLINICAL DATA:  Non-specific chest pain and abdominal pain since 1500 hours. History of hypertension and atrial fibrillation. EXAM: CT ANGIOGRAPHY CHEST CT ABDOMEN AND PELVIS WITH CONTRAST TECHNIQUE: Multidetector CT imaging of the chest was performed using the standard protocol during bolus administration of intravenous contrast. Multiplanar CT image reconstructions and MIPs were obtained to evaluate the vascular anatomy. Multidetector CT imaging of the abdomen and pelvis was performed using the standard protocol during bolus  administration of intravenous contrast. Multidetector CT imaging of the chest was performed using the standard protocol during bolus administration of intravenous contrast. Multiplanar CT image reconstructions and MIPs were obtained to evaluate the vascular anatomy. Automatic exposure control utilized. CONTRAST:  169mL OMNIPAQUE IOHEXOL 350 MG/ML SOLN COMPARISON:  January 20, 2019. FINDINGS: CTA CHEST FINDINGS Cardiovascular: Pulmonary emboli in the bilateral proximal lobar pulmonary arteries and more peripheral bilateral pulmonary arteries. No central pulmonary artery embolism. Right ventricular to left ventricular ratio 0.7; no right heart strain. Mild cardiomegaly with fatty hypertrophy of the inter atrial septum and four-vessel moderate to severe coronary calcification. Mediastinum/Nodes: Metastatic appearing right paratracheal conglomerate adenopathy measuring 3.2 by 2.4 cm. Metastatic-appearing superior mediastinal lymph node measuring 23 by 16 mm. Metastatic appearing right hilar lymph node measuring 16 by 22 mm. Additional smaller mediastinal and bilateral hilar lymph nodes that could be metastatic or benign. Small hiatal hernia. Lungs/Pleura: No pleural effusion. Bilateral spiculated upper lobe pulmonary nodules measuring 2.4 cm diameter in the left upper lobe on series 2, image 39, and 2.6 x 2.0 cm in the right upper lobe on image 26. Both nodules have increased in size is since December 2020 at which time they measured 19 x 18 mm on the left, and 8 mm diameter on the right. Respiratory motion artifact Upper Abdomen: Reported on concomitant CT abdomen and pelvis reported above. Musculoskeletal: Bone demineralization. Moderate skeletal degenerative change. A 6 mm sclerotic lesion in the T12 vertebral body stable compared to December 2020. Review of the MIP images confirms the above findings. CT ABDOMEN and PELVIS FINDINGS Hepatobiliary: A new 14 mm hypoattenuating lesion in the right hemi liver, highly  worrisome for metastasis. Chronic mild intrahepatic and 14 mm proximal common bile duct dilatation with normal distal tapering. Normal gallbladder. Pancreas: Normal. Spleen: Normal. Adrenals/Urinary Tract: Normal bilateral adrenal glands. Bilateral medical renal disease and simple appearing renal cortical cysts measuring up to 4.4 cm on the right. No hydronephrosis or nephroureterolithiasis bilaterally. No apparent urinary bladder abnormality. A dense 10 mm right renal midpole hypoattenuation. Stomach/Bowel: No bowel obstruction. Moderate stool burden and diverticulosis coli without an acute diverticulitis or apparent bowel wall thickening. Normal appendix, axial image 60. Vascular/Lymphatic: Aortic bilateral iliac in major abdominal artery calcified atherosclerosis with probably clinically significant 75% narrowing of the left common iliac artery. No abdominal aortic aneurysm. Small nonspecific retrocrural, gastrohepatic ligament and retroperitoneal lymph nodes. A mildly enlarged periportal lymph node measuring 12 x 16 mm. No abdominal aortic dissection. Reproductive: Mild prostatomegaly. Other: None. Musculoskeletal: Moderate skeletal degenerative change. A 9 mm ovoid sclerotic lesion in the right proximal femoral shaft, unchanged. Review of the MIP images confirms the above findings. I discussed critical Value/emergent results by telephone at the time of interpretation on 04/21/2019  at 12:12 am with provider Providence Lanius , who verbally acknowledged these results. IMPRESSION: Acute bilateral lobar and peripheral pulmonary emboli without right heart strain. No thoracoabdominal aortic dissection. Suspicious bilateral upper lobe solid pulmonary nodules, both increased in size since December 2020. Histopathological correlation recommended. An embolic or infectious etiology are less likely differential considerations. Malignant-appearing mediastinal and right hilar adenopathy. Enlarged nonspecific gastrohepatic node.  Additional small nonspecific lymph nodes above and below diaphragm which could be metastatic or benign. New hepatic lesion concerning for metastatic disease. Normal bilateral adrenal glands. A new 10 mm dense right renal midpole lesion; renal ultrasonography recommended. A proteinaceous or hemorrhagic cortical cyst or malignant renal lesion are differential considerations. Mild cardiomegaly with four-vessel moderate to severe coronary calcification. Small hiatal hernia. Stable 6 mm T12 and 9 mm right proximal femoral shaft sclerotic lesions since December 2020. Thoracoabdominal aorta and major abdominal artery calcified atherosclerosis without aneurysm. Probably clinically significant 75% atherosclerotic narrowing of the right common iliac artery. Diverticulosis coli. Chronic biliary dilatation. Electronically Signed   By: Revonda Humphrey   On: 04/21/2019 00:17   CT ABDOMEN PELVIS W CONTRAST  Result Date: 04/21/2019 CLINICAL DATA:  Non-specific chest pain and abdominal pain since 1500 hours. History of hypertension and atrial fibrillation. EXAM: CT ANGIOGRAPHY CHEST CT ABDOMEN AND PELVIS WITH CONTRAST TECHNIQUE: Multidetector CT imaging of the chest was performed using the standard protocol during bolus administration of intravenous contrast. Multiplanar CT image reconstructions and MIPs were obtained to evaluate the vascular anatomy. Multidetector CT imaging of the abdomen and pelvis was performed using the standard protocol during bolus administration of intravenous contrast. Multidetector CT imaging of the chest was performed using the standard protocol during bolus administration of intravenous contrast. Multiplanar CT image reconstructions and MIPs were obtained to evaluate the vascular anatomy. Automatic exposure control utilized. CONTRAST:  118mL OMNIPAQUE IOHEXOL 350 MG/ML SOLN COMPARISON:  January 20, 2019. FINDINGS: CTA CHEST FINDINGS Cardiovascular: Pulmonary emboli in the bilateral proximal lobar  pulmonary arteries and more peripheral bilateral pulmonary arteries. No central pulmonary artery embolism. Right ventricular to left ventricular ratio 0.7; no right heart strain. Mild cardiomegaly with fatty hypertrophy of the inter atrial septum and four-vessel moderate to severe coronary calcification. Mediastinum/Nodes: Metastatic appearing right paratracheal conglomerate adenopathy measuring 3.2 by 2.4 cm. Metastatic-appearing superior mediastinal lymph node measuring 23 by 16 mm. Metastatic appearing right hilar lymph node measuring 16 by 22 mm. Additional smaller mediastinal and bilateral hilar lymph nodes that could be metastatic or benign. Small hiatal hernia. Lungs/Pleura: No pleural effusion. Bilateral spiculated upper lobe pulmonary nodules measuring 2.4 cm diameter in the left upper lobe on series 2, image 39, and 2.6 x 2.0 cm in the right upper lobe on image 26. Both nodules have increased in size is since December 2020 at which time they measured 19 x 18 mm on the left, and 8 mm diameter on the right. Respiratory motion artifact Upper Abdomen: Reported on concomitant CT abdomen and pelvis reported above. Musculoskeletal: Bone demineralization. Moderate skeletal degenerative change. A 6 mm sclerotic lesion in the T12 vertebral body stable compared to December 2020. Review of the MIP images confirms the above findings. CT ABDOMEN and PELVIS FINDINGS Hepatobiliary: A new 14 mm hypoattenuating lesion in the right hemi liver, highly worrisome for metastasis. Chronic mild intrahepatic and 14 mm proximal common bile duct dilatation with normal distal tapering. Normal gallbladder. Pancreas: Normal. Spleen: Normal. Adrenals/Urinary Tract: Normal bilateral adrenal glands. Bilateral medical renal disease and simple appearing renal cortical cysts measuring up  to 4.4 cm on the right. No hydronephrosis or nephroureterolithiasis bilaterally. No apparent urinary bladder abnormality. A dense 10 mm right renal midpole  hypoattenuation. Stomach/Bowel: No bowel obstruction. Moderate stool burden and diverticulosis coli without an acute diverticulitis or apparent bowel wall thickening. Normal appendix, axial image 60. Vascular/Lymphatic: Aortic bilateral iliac in major abdominal artery calcified atherosclerosis with probably clinically significant 75% narrowing of the left common iliac artery. No abdominal aortic aneurysm. Small nonspecific retrocrural, gastrohepatic ligament and retroperitoneal lymph nodes. A mildly enlarged periportal lymph node measuring 12 x 16 mm. No abdominal aortic dissection. Reproductive: Mild prostatomegaly. Other: None. Musculoskeletal: Moderate skeletal degenerative change. A 9 mm ovoid sclerotic lesion in the right proximal femoral shaft, unchanged. Review of the MIP images confirms the above findings. I discussed critical Value/emergent results by telephone at the time of interpretation on 04/21/2019 at 12:12 am with provider Providence Lanius , who verbally acknowledged these results. IMPRESSION: Acute bilateral lobar and peripheral pulmonary emboli without right heart strain. No thoracoabdominal aortic dissection. Suspicious bilateral upper lobe solid pulmonary nodules, both increased in size since December 2020. Histopathological correlation recommended. An embolic or infectious etiology are less likely differential considerations. Malignant-appearing mediastinal and right hilar adenopathy. Enlarged nonspecific gastrohepatic node. Additional small nonspecific lymph nodes above and below diaphragm which could be metastatic or benign. New hepatic lesion concerning for metastatic disease. Normal bilateral adrenal glands. A new 10 mm dense right renal midpole lesion; renal ultrasonography recommended. A proteinaceous or hemorrhagic cortical cyst or malignant renal lesion are differential considerations. Mild cardiomegaly with four-vessel moderate to severe coronary calcification. Small hiatal hernia. Stable  6 mm T12 and 9 mm right proximal femoral shaft sclerotic lesions since December 2020. Thoracoabdominal aorta and major abdominal artery calcified atherosclerosis without aneurysm. Probably clinically significant 75% atherosclerotic narrowing of the right common iliac artery. Diverticulosis coli. Chronic biliary dilatation. Electronically Signed   By: Revonda Humphrey   On: 04/21/2019 00:17   ECHOCARDIOGRAM COMPLETE  Result Date: 04/21/2019    ECHOCARDIOGRAM REPORT   Patient Name:   KYZEN HORN Date of Exam: 04/21/2019 Medical Rec #:  [id number]       Height:       69.0 in Accession #:    [id number]      Weight:       213.0 lb Date of Birth:  20-Nov-1933       BSA:          2.122 m Patient Age:    71 years        BP:           159/76 mmHg Patient Gender: M               HR:           98 bpm. Exam Location:  Inpatient Procedure: 2D Echo, Cardiac Doppler, Color Doppler and Intracardiac            Opacification Agent Indications:    Pulmonary embolus 415.19  History:        Patient has no prior history of Echocardiogram examinations.                 Risk Factors:Hypertension and Former Smoker. Pulmonary embolus.  Sonographer:    Vickie Epley RDCS Referring Phys: [place]  Sonographer Comments: Technically difficult study due to poor echo windows. IMPRESSIONS  1. Left ventricular ejection fraction, by estimation, is 55 to 60%. The left ventricle has normal function. The left ventricle has no regional wall  motion abnormalities. Left ventricular diastolic parameters are consistent with Grade I diastolic dysfunction (impaired relaxation).  2. RV not well visualized. Appears to have low normal function. There is ventricular septal dyssynchrony - may be due to bundle branch block. Negative McConnell's sign.. Right ventricular systolic function is low normal. The right ventricular size is normal.  3. The mitral valve is grossly normal. Trivial mitral valve regurgitation.  4. The aortic valve is tricuspid.  Aortic valve regurgitation is not visualized.  5. The inferior vena cava is dilated in size with >50% respiratory variability, suggesting right atrial pressure of 8 mmHg. FINDINGS  Left Ventricle: Left ventricular ejection fraction, by estimation, is 55 to 60%. The left ventricle has normal function. The left ventricle has no regional wall motion abnormalities. Definity contrast agent was given IV to delineate the left ventricular  endocardial borders. The left ventricular internal cavity size was normal in size. There is no left ventricular hypertrophy. Abnormal (paradoxical) septal motion, consistent with left bundle branch block. Left ventricular diastolic parameters are consistent with Grade I diastolic dysfunction (impaired relaxation). Indeterminate filling pressures. Right Ventricle: RV not well visualized. Appears to have low normal function. There is ventricular septal dyssynchrony - may be due to bundle branch block. Negative McConnell's sign. The right ventricular size is normal. No increase in right ventricular wall thickness. Right ventricular systolic function is low normal. Left Atrium: Left atrial size was normal in size. Right Atrium: Right atrial size was normal in size. Pericardium: Trivial pericardial effusion is present. The pericardial effusion is localized near the right ventricle. Mitral Valve: The mitral valve is grossly normal. Trivial mitral valve regurgitation. Tricuspid Valve: The tricuspid valve is not well visualized. Tricuspid valve regurgitation is not demonstrated. Aortic Valve: The aortic valve is tricuspid. Aortic valve regurgitation is not visualized. Pulmonic Valve: The pulmonic valve was grossly normal. Pulmonic valve regurgitation is not visualized. Aorta: The aortic root, ascending aorta, aortic arch and descending aorta are all structurally normal, with no evidence of dilitation or obstruction. Venous: The inferior vena cava is dilated in size with greater than 50%  respiratory variability, suggesting right atrial pressure of 8 mmHg. IAS/Shunts: No atrial level shunt detected by color flow Doppler.  LEFT VENTRICLE PLAX 2D LVIDd:         5.30 cm  Diastology LVIDs:         3.70 cm  LV e' lateral:   7.40 cm/s LV PW:         1.00 cm  LV E/e' lateral: 10.5 LV IVS:        1.00 cm  LV e' medial:    5.98 cm/s LVOT diam:     2.10 cm  LV E/e' medial:  13.0 LV SV:         67 LV SV Index:   32 LVOT Area:     3.46 cm  RIGHT VENTRICLE RV S prime:     10.60 cm/s TAPSE (M-mode): 1.9 cm LEFT ATRIUM             Index LA diam:        3.80 cm 1.79 cm/m LA Vol (A2C):   42.8 ml 20.17 ml/m LA Vol (A4C):   42.4 ml 19.98 ml/m LA Biplane Vol: 44.3 ml 20.88 ml/m  AORTIC VALVE LVOT Vmax:   107.00 cm/s LVOT Vmean:  81.100 cm/s LVOT VTI:    0.194 m  AORTA Ao Root diam: 3.50 cm MITRAL VALVE MV Area (PHT): 4.63 cm     SHUNTS  MV Decel Time: 164 msec     Systemic VTI:  0.19 m MV E velocity: 77.70 cm/s   Systemic Diam: 2.10 cm MV A velocity: 108.00 cm/s MV E/A ratio:  0.72 Lyman Bishop MD Electronically signed by Lyman Bishop MD Signature Date/Time: 04/21/2019/10:53:47 AM    Final    VAS [place] LOWER EXTREMITY VENOUS (DVT)  Result Date: 04/21/2019  Lower Venous DVTStudy Indications: Pulmonary embolism.  Comparison Study: no prior Performing Technologist: [name] RVS  Examination Guidelines: A complete evaluation includes B-mode imaging, spectral Doppler, color Doppler, and power Doppler as needed of all accessible portions of each vessel. Bilateral testing is considered an integral part of a complete examination. Limited examinations for reoccurring indications may be performed as noted. The reflux portion of the exam is performed with the patient in reverse Trendelenburg.  +---------+---------------+---------+-----------+----------+--------------+ RIGHT    CompressibilityPhasicitySpontaneityPropertiesThrombus Aging +---------+---------------+---------+-----------+----------+--------------+  CFV      Full           Yes      Yes                                 +---------+---------------+---------+-----------+----------+--------------+ SFJ      Full                                                        +---------+---------------+---------+-----------+----------+--------------+ FV Prox  Full                                                        +---------+---------------+---------+-----------+----------+--------------+ FV Mid   Full                                                        +---------+---------------+---------+-----------+----------+--------------+ FV DistalFull                                                        +---------+---------------+---------+-----------+----------+--------------+ PFV      Full                                                        +---------+---------------+---------+-----------+----------+--------------+ POP      Full           Yes      Yes                                 +---------+---------------+---------+-----------+----------+--------------+ PTV      Full                                                        +---------+---------------+---------+-----------+----------+--------------+  PERO                                                  Not visualized +---------+---------------+---------+-----------+----------+--------------+   +---------+---------------+---------+-----------+----------+--------------+ LEFT     CompressibilityPhasicitySpontaneityPropertiesThrombus Aging +---------+---------------+---------+-----------+----------+--------------+ CFV      Full           Yes      Yes                                 +---------+---------------+---------+-----------+----------+--------------+ SFJ      Full                                                        +---------+---------------+---------+-----------+----------+--------------+ FV Prox  Full                                                         +---------+---------------+---------+-----------+----------+--------------+ FV Mid   Full                                                        +---------+---------------+---------+-----------+----------+--------------+ FV DistalFull                                                        +---------+---------------+---------+-----------+----------+--------------+ PFV      Full                                                        +---------+---------------+---------+-----------+----------+--------------+ POP      Full           Yes      Yes                                 +---------+---------------+---------+-----------+----------+--------------+ PTV                                                   Not visualized +---------+---------------+---------+-----------+----------+--------------+ PERO                                                  Not visualized +---------+---------------+---------+-----------+----------+--------------+     Summary: BILATERAL: - No evidence  of deep vein thrombosis seen in the lower extremities, bilaterally.   *See table(s) above for measurements and observations. Electronically signed by Curt Jews MD on 04/21/2019 at 2:50:03 PM.    Final       LAB RESULTS: Basic Metabolic Panel: Recent Labs  Lab 04/21/19 0707 04/22/19 0734  NA 129* 127*  K 4.5 4.3  CL 94* 96*  CO2 21* 20*  GLUCOSE 433* 206*  BUN 17 32*  CREATININE 1.37* 1.46*  CALCIUM 9.1 8.5*   Liver Function Tests: Recent Labs  Lab 04/20/19 2247 04/21/19 0707  AST 216* 326*  ALT 155* 223*  ALKPHOS 468* 472*  BILITOT 4.3* 6.8*  PROT 7.6 7.4  ALBUMIN 3.7 3.4*   Recent Labs  Lab 04/20/19 2247  LIPASE 39   No results for input(s): AMMONIA in the last 168 hours. CBC: Recent Labs  Lab 04/21/19 0510 04/21/19 0510 04/22/19 0734  WBC 18.1*  --  14.9*  HGB 14.5  --  12.9*  HCT 42.4  --  38.1*  MCV 92.0   < > 90.9  PLT 313  --   238   < > = values in this interval not displayed.   Cardiac Enzymes: No results for input(s): CKTOTAL, CKMB, CKMBINDEX, TROPONINI in the last 168 hours. BNP: Invalid input(s): POCBNP CBG: Recent Labs  Lab 04/22/19 0754 04/22/19 0916  GLUCAP 204* 190*       Disposition and Follow-up: Discharge Instructions    Diet - low sodium heart healthy   Complete by: As directed    Discharge instructions   Complete by: As directed    Please complete the Xarelto starter pack.  Take 15 mg tablet twice a day for 21 days.  On day #22 (05/13/2019), switch to 20 mg tablet with supper. Please follow with your [facility] as soon as possible.   Increase activity slowly   Complete by: As directed        DISPOSITION: home    [place]. Schedule an appointment as soon as possible for a visit in 1 week(s).   Specialty: General Practice Contact information: [address] [place] [address] [phone]        Garner Nash, DO. Schedule an appointment as soon as possible for a visit in 2 week(s).   Specialty: Pulmonary Disease Why: You can follow-up with Dr. Valeta Harms (pulmonology), if you wish to proceed with bronchoscopy and work-up.   Contact information: [place] [address] [phone]            Time coordinating discharge:  35 minutes  Signed:   Estill Cotta M.D. [facility] 04/22/2019, 12:17 PM

## 2019-04-22 NOTE — Progress Notes (Signed)
ANTICOAGULATION CONSULT NOTE - Follow Up Consult  Pharmacy Consult for Xarelto Indication: pulmonary embolus  No Known Allergies  Patient Measurements: Height: 5\' 9"  (175.3 cm) Weight: 213 lb (96.6 kg) IBW/kg (Calculated) : 70.7 Heparin Dosing Weight: 91kg  Vital Signs: Temp: 98.5 F (36.9 C) (03/11 0536) Temp Source: Oral (03/11 0536) BP: 122/57 (03/11 0913) Pulse Rate: 69 (03/11 0913)  Labs: Recent Labs    04/20/19 2247 04/20/19 2247 04/21/19 0510 04/21/19 0707 04/21/19 1336 04/21/19 2125 04/22/19 0734  HGB 15.3   < > 14.5  --   --   --  12.9*  HCT 46.3  --  42.4  --   --   --  38.1*  PLT 287  --  313  --   --   --  238  HEPARINUNFRC  --   --   --   --  0.54 0.87* 0.69  CREATININE 1.34*  --   --  1.37*  --   --  1.46*  TROPONINIHS 5  --  10 11  --   --   --    < > = values in this interval not displayed.    Estimated Creatinine Clearance: 44 mL/min (A) (by C-G formula based on SCr of 1.46 mg/dL (H)).   Medications:  Infusions:    Assessment: 56 yoM admitted on 3/9 with bilateral PE.  Pharmacy is now consulted to transition from Heparin to Xarelto.  Today, 04/22/2019: Heparin level decreased to 0.69, therapeutic on heparin 1250 units/hr. CBC: Hgb decreased to 12.9, Plt 238k.  No bleeding or complications reported.   Goal of Therapy:  Heparin level 0.3-0.7 units/ml Monitor platelets by anticoagulation protocol: Yes   Plan:  Stop heparin infusion. Xarelto 15 mg BID x21 days, then 20mg  daily. Pharmacy has completed counseling and given a 30 day free trial card for Xarelto.   Gretta Arab PharmD, BCPS Clinical pharmacist phone 7am- 5pm: [phone] 04/22/2019 10:27 AM

## 2019-04-22 NOTE — Progress Notes (Signed)
NAME:  Dennis Stephens, MRN:  [id number], DOB:  05-23-1933, LOS: 1 ADMISSION DATE:  04/20/2019, CONSULTATION DATE: 04/21/2019 REFERRING MD: Tyler Pita, MD, CHIEF COMPLAINT: Lung nodule, adenopathy  Brief History   86 year old ex-smoker with, hyperlipidemia, BPH presenting with chest, abdomen abdominal pain.  Found to have bilateral lobar pulmonary embolism with bilateral lung nodules, lymphadenopathy and possible hepatic lesion concerning for malignancy. IR feels that the hepatic lesion is subtle and may not be a good candidate for liver biopsy.  PCCM consulted for bronchoscopy.  Past Medical History   Past Medical History:  Diagnosis Date  . A-fib ([place])   . Hypertension    Significant Hospital Events   Admitted 3/10  Consults:  pccm  Procedures:  Scheduled for bronchoscopy 3/12  Significant Diagnostic Tests:  CTA 04/20/2019-no apparent peripheral pulmonary emboli, no right heart strain, bilateral pulmonary nodules which are increased in size, mediastinal, hilar lymphadenopathy.  Hepatic lesion, I have reviewed the images personally. CT was compared to one performed in December 2020 Micro Data:    Antimicrobials:     Interim history/subjective:  No overnight events Has no complaints He has decided he does not want to go to treat with the procedure tomorrow  Objective   Blood pressure 107/67, pulse 73, temperature 98.5 F (36.9 C), temperature source Oral, resp. rate 20, height 5\' 9"  (1.753 m), weight 96.6 kg, SpO2 95 %.        Intake/Output Summary (Last 24 hours) at 04/22/2019 0901 Last data filed at 04/22/2019 6948 Gross per 24 hour  Intake 840.72 ml  Output 550 ml  Net 290.72 ml   Filed Weights   04/20/19 2131  Weight: 96.6 kg    Examination: General: Elderly gentleman, does not appear to be in distress HENT: Moist oral mucosa Lungs: Clear breath sounds bilaterally, no added sounds Cardiovascular: S1-S2 appreciated, no murmur Abdomen: Soft, bowel sounds  appreciated Extremities: No clubbing, no edema Neuro: Alert and oriented x3 GU: [facility] Problem list     Assessment & Plan:  Bilateral lung nodules -Significant smoking history -He is tentatively scheduled for EBUS and navigational bronchoscopy for biopsy -He has decided he does not want to have the procedure tomorrow-he is aware that procedure is not offered at the [place].  He will make his decisions following discussion with his physician.  He states he has an appointment on Tuesday -Schedule procedure will be canceled  Bilateral pulmonary embolism -Transition to [place]   Will sign off at present  Dr. Valeta Harms will be glad to see him as an outpatient if he desires to schedule procedure-he does require EBUS and navigational bronchoscopy.    Best practice:  Diet:  Pain/Anxiety/Delirium protocol (if indicated):  VAP protocol (if indicated):  DVT prophylaxis: On heparin GTT GI prophylaxis: Protonix Glucose control: Will monitor Mobility: Bedrest Code Status: Patient is DNR Family Communication: Discussed with patient Disposition:   Labs   CBC: Recent Labs  Lab 04/20/19 2247 04/21/19 0510 04/22/19 0734  WBC 14.9* 18.1* 14.9*  HGB 15.3 14.5 12.9*  HCT 46.3 42.4 38.1*  MCV 93.3 92.0 90.9  PLT 287 313 546    Basic Metabolic Panel: Recent Labs  Lab 04/20/19 2247 04/21/19 0707 04/22/19 0734  NA 131* 129* 127*  K 4.4 4.5 4.3  CL 95* 94* 96*  CO2 22 21* 20*  GLUCOSE 357* 433* 206*  BUN 20 17 32*  CREATININE 1.34* 1.37* 1.46*  CALCIUM 9.3 9.1 8.5*   GFR: Estimated Creatinine Clearance: 44  mL/min (A) (by C-G formula based on SCr of 1.46 mg/dL (H)). Recent Labs  Lab 04/20/19 2247 04/21/19 0510 04/22/19 0734  WBC 14.9* 18.1* 14.9*    Liver Function Tests: Recent Labs  Lab 04/20/19 2247 04/21/19 0707  AST 216* 326*  ALT 155* 223*  ALKPHOS 468* 472*  BILITOT 4.3* 6.8*  PROT 7.6 7.4  ALBUMIN 3.7 3.4*   Recent Labs  Lab 04/20/19 2247   LIPASE 39   No results for input(s): AMMONIA in the last 168 hours.  ABG    Component Value Date/Time   TCO2 25 01/20/2019 1823     Coagulation Profile: No results for input(s): INR, PROTIME in the last 168 hours.  Cardiac Enzymes: No results for input(s): CKTOTAL, CKMB, CKMBINDEX, TROPONINI in the last 168 hours.  HbA1C: Hgb A1c MFr Bld  Date/Time Value Ref Range Status  04/21/2019 05:34 AM 8.7 (H) 4.8 - 5.6 % Final    Comment:    (NOTE) Pre diabetes:          5.7%-6.4% Diabetes:              >6.4% Glycemic control for   <7.0% adults with diabetes     CBG: Recent Labs  Lab 04/21/19 1159 04/21/19 1635 04/21/19 2106 04/22/19 0754  GLUCAP 365* 197* 262* 204*    Review of Systems:   Denies any pain or discomfort Breathing appears stable  Past Medical History  He,  has a past medical history of A-fib ([place]) and Hypertension.   Surgical History   History reviewed. No pertinent surgical history.   Social History   reports that he has quit smoking. He has never used smokeless tobacco. He reports previous alcohol use. He reports previous drug use.   Family History   His family history is negative for Diabetes Mellitus II.   Allergies No Known Allergies    Sherrilyn Rist, MD [place], PCCM Cell: [phone]

## 2019-04-23 SURGERY — BRONCHOSCOPY, WITH FLUOROSCOPY
Anesthesia: General

## 2019-04-25 ENCOUNTER — Other Ambulatory Visit: Payer: Self-pay

## 2019-04-25 ENCOUNTER — Emergency Department (HOSPITAL_COMMUNITY): Payer: No Typology Code available for payment source

## 2019-04-25 ENCOUNTER — Inpatient Hospital Stay (HOSPITAL_COMMUNITY)
Admission: EM | Admit: 2019-04-25 | Discharge: 2019-05-06 | DRG: 987 | Disposition: A | Discharge status: Hospice | Payer: No Typology Code available for payment source | Attending: Internal Medicine | Admitting: Internal Medicine

## 2019-04-25 ENCOUNTER — Encounter (HOSPITAL_COMMUNITY): Payer: Self-pay | Admitting: Obstetrics and Gynecology

## 2019-04-25 DIAGNOSIS — G251 Drug-induced tremor: Secondary | ICD-10-CM | POA: Diagnosis not present

## 2019-04-25 DIAGNOSIS — Z6831 Body mass index (BMI) 31.0-31.9, adult: Secondary | ICD-10-CM | POA: Diagnosis not present

## 2019-04-25 DIAGNOSIS — K859 Acute pancreatitis without necrosis or infection, unspecified: Secondary | ICD-10-CM | POA: Diagnosis present

## 2019-04-25 DIAGNOSIS — K921 Melena: Secondary | ICD-10-CM | POA: Diagnosis not present

## 2019-04-25 DIAGNOSIS — K851 Biliary acute pancreatitis without necrosis or infection: Secondary | ICD-10-CM | POA: Diagnosis not present

## 2019-04-25 DIAGNOSIS — R109 Unspecified abdominal pain: Secondary | ICD-10-CM

## 2019-04-25 DIAGNOSIS — J9601 Acute respiratory failure with hypoxia: Secondary | ICD-10-CM | POA: Diagnosis not present

## 2019-04-25 DIAGNOSIS — I10 Essential (primary) hypertension: Secondary | ICD-10-CM | POA: Diagnosis present

## 2019-04-25 DIAGNOSIS — R590 Localized enlarged lymph nodes: Secondary | ICD-10-CM | POA: Diagnosis present

## 2019-04-25 DIAGNOSIS — N182 Chronic kidney disease, stage 2 (mild): Secondary | ICD-10-CM | POA: Diagnosis present

## 2019-04-25 DIAGNOSIS — E669 Obesity, unspecified: Secondary | ICD-10-CM | POA: Diagnosis present

## 2019-04-25 DIAGNOSIS — K264 Chronic or unspecified duodenal ulcer with hemorrhage: Secondary | ICD-10-CM | POA: Diagnosis not present

## 2019-04-25 DIAGNOSIS — I4891 Unspecified atrial fibrillation: Secondary | ICD-10-CM | POA: Diagnosis present

## 2019-04-25 DIAGNOSIS — E871 Hypo-osmolality and hyponatremia: Secondary | ICD-10-CM | POA: Diagnosis present

## 2019-04-25 DIAGNOSIS — Z87891 Personal history of nicotine dependence: Secondary | ICD-10-CM

## 2019-04-25 DIAGNOSIS — C787 Secondary malignant neoplasm of liver and intrahepatic bile duct: Secondary | ICD-10-CM | POA: Diagnosis present

## 2019-04-25 DIAGNOSIS — Y92230 Patient room in hospital as the place of occurrence of the external cause: Secondary | ICD-10-CM | POA: Diagnosis not present

## 2019-04-25 DIAGNOSIS — I129 Hypertensive chronic kidney disease with stage 1 through stage 4 chronic kidney disease, or unspecified chronic kidney disease: Secondary | ICD-10-CM | POA: Diagnosis present

## 2019-04-25 DIAGNOSIS — R7989 Other specified abnormal findings of blood chemistry: Secondary | ICD-10-CM | POA: Diagnosis present

## 2019-04-25 DIAGNOSIS — R0602 Shortness of breath: Secondary | ICD-10-CM | POA: Diagnosis not present

## 2019-04-25 DIAGNOSIS — R918 Other nonspecific abnormal finding of lung field: Secondary | ICD-10-CM | POA: Diagnosis not present

## 2019-04-25 DIAGNOSIS — N179 Acute kidney failure, unspecified: Secondary | ICD-10-CM | POA: Diagnosis not present

## 2019-04-25 DIAGNOSIS — I2699 Other pulmonary embolism without acute cor pulmonale: Secondary | ICD-10-CM | POA: Diagnosis present

## 2019-04-25 DIAGNOSIS — E1122 Type 2 diabetes mellitus with diabetic chronic kidney disease: Secondary | ICD-10-CM | POA: Diagnosis present

## 2019-04-25 DIAGNOSIS — D689 Coagulation defect, unspecified: Secondary | ICD-10-CM | POA: Diagnosis not present

## 2019-04-25 DIAGNOSIS — Z7189 Other specified counseling: Secondary | ICD-10-CM | POA: Diagnosis not present

## 2019-04-25 DIAGNOSIS — K8070 Calculus of gallbladder and bile duct without cholecystitis without obstruction: Secondary | ICD-10-CM | POA: Diagnosis present

## 2019-04-25 DIAGNOSIS — K8033 Calculus of bile duct with acute cholangitis with obstruction: Secondary | ICD-10-CM | POA: Diagnosis not present

## 2019-04-25 DIAGNOSIS — Z8249 Family history of ischemic heart disease and other diseases of the circulatory system: Secondary | ICD-10-CM

## 2019-04-25 DIAGNOSIS — I2510 Atherosclerotic heart disease of native coronary artery without angina pectoris: Secondary | ICD-10-CM | POA: Diagnosis present

## 2019-04-25 DIAGNOSIS — T45515A Adverse effect of anticoagulants, initial encounter: Secondary | ICD-10-CM | POA: Diagnosis not present

## 2019-04-25 DIAGNOSIS — T426X5A Adverse effect of other antiepileptic and sedative-hypnotic drugs, initial encounter: Secondary | ICD-10-CM | POA: Diagnosis not present

## 2019-04-25 DIAGNOSIS — Z20822 Contact with and (suspected) exposure to covid-19: Secondary | ICD-10-CM | POA: Diagnosis not present

## 2019-04-25 DIAGNOSIS — E1165 Type 2 diabetes mellitus with hyperglycemia: Secondary | ICD-10-CM | POA: Diagnosis present

## 2019-04-25 DIAGNOSIS — Z515 Encounter for palliative care: Secondary | ICD-10-CM | POA: Diagnosis not present

## 2019-04-25 DIAGNOSIS — Z66 Do not resuscitate: Secondary | ICD-10-CM | POA: Diagnosis present

## 2019-04-25 DIAGNOSIS — E785 Hyperlipidemia, unspecified: Secondary | ICD-10-CM | POA: Diagnosis present

## 2019-04-25 DIAGNOSIS — K8050 Calculus of bile duct without cholangitis or cholecystitis without obstruction: Secondary | ICD-10-CM

## 2019-04-25 DIAGNOSIS — D62 Acute posthemorrhagic anemia: Secondary | ICD-10-CM | POA: Diagnosis not present

## 2019-04-25 DIAGNOSIS — Z79899 Other long term (current) drug therapy: Secondary | ICD-10-CM

## 2019-04-25 DIAGNOSIS — R531 Weakness: Secondary | ICD-10-CM | POA: Diagnosis not present

## 2019-04-25 DIAGNOSIS — Z86711 Personal history of pulmonary embolism: Secondary | ICD-10-CM

## 2019-04-25 DIAGNOSIS — C3412 Malignant neoplasm of upper lobe, left bronchus or lung: Secondary | ICD-10-CM | POA: Diagnosis present

## 2019-04-25 DIAGNOSIS — K567 Ileus, unspecified: Secondary | ICD-10-CM | POA: Diagnosis not present

## 2019-04-25 DIAGNOSIS — Z9889 Other specified postprocedural states: Secondary | ICD-10-CM

## 2019-04-25 DIAGNOSIS — K8051 Calculus of bile duct without cholangitis or cholecystitis with obstruction: Secondary | ICD-10-CM | POA: Diagnosis not present

## 2019-04-25 DIAGNOSIS — Z7901 Long term (current) use of anticoagulants: Secondary | ICD-10-CM

## 2019-04-25 HISTORY — DX: Type 2 diabetes mellitus without complications: E11.9

## 2019-04-25 LAB — COMPREHENSIVE METABOLIC PANEL
ALT: 200 U/L — ABNORMAL HIGH (ref 0–44)
AST: 287 U/L — ABNORMAL HIGH (ref 15–41)
Albumin: 2.6 g/dL — ABNORMAL LOW (ref 3.5–5.0)
Alkaline Phosphatase: 569 U/L — ABNORMAL HIGH (ref 38–126)
Anion gap: 12 (ref 5–15)
BUN: 15 mg/dL (ref 8–23)
CO2: 22 mmol/L (ref 22–32)
Calcium: 8.7 mg/dL — ABNORMAL LOW (ref 8.9–10.3)
Chloride: 96 mmol/L — ABNORMAL LOW (ref 98–111)
Creatinine, Ser: 1.1 mg/dL (ref 0.61–1.24)
GFR calc Af Amer: >60 mL/min (ref >60)
GFR calc non Af Amer: >60 mL/min (ref >60)
Glucose, Bld: 184 mg/dL — ABNORMAL HIGH (ref 70–99)
Potassium: 3.8 mmol/L (ref 3.5–5.1)
Sodium: 130 mmol/L — ABNORMAL LOW (ref 135–145)
Total Bilirubin: 4.3 mg/dL — ABNORMAL HIGH (ref 0.3–1.2)
Total Protein: 6.7 g/dL (ref 6.5–8.1)

## 2019-04-25 LAB — CBC
HCT: 43 % (ref 39.0–52.0)
Hemoglobin: 14.2 g/dL (ref 13.0–17.0)
MCH: 30.4 pg (ref 26.0–34.0)
MCHC: 33 g/dL (ref 30.0–36.0)
MCV: 92.1 fL (ref 80.0–100.0)
Platelets: 302 10*3/uL (ref 150–400)
RBC: 4.67 MIL/uL (ref 4.22–5.81)
RDW: 14.1 % (ref 11.5–15.5)
WBC: 15.4 10*3/uL — ABNORMAL HIGH (ref 4.0–10.5)
nRBC: 0 % (ref 0.0–0.2)

## 2019-04-25 LAB — URINALYSIS, ROUTINE W REFLEX MICROSCOPIC
Bacteria, UA: NONE SEEN
Bilirubin Urine: NEGATIVE
Glucose, UA: NEGATIVE mg/dL
Ketones, ur: NEGATIVE mg/dL
Leukocytes,Ua: NEGATIVE
Nitrite: NEGATIVE
Protein, ur: NEGATIVE mg/dL
Specific Gravity, Urine: 1.006 (ref 1.005–1.030)
pH: 5 (ref 5.0–8.0)

## 2019-04-25 LAB — BRAIN NATRIURETIC PEPTIDE: B Natriuretic Peptide: 128.3 pg/mL — ABNORMAL HIGH (ref 0.0–100.0)

## 2019-04-25 LAB — TROPONIN I (HIGH SENSITIVITY)
Troponin I (High Sensitivity): 8 ng/L (ref <18)
Troponin I (High Sensitivity): 8 ng/L (ref <18)

## 2019-04-25 LAB — LIPASE, BLOOD: Lipase: 2207 U/L — ABNORMAL HIGH (ref 11–51)

## 2019-04-25 MED ORDER — MORPHINE SULFATE (PF) 4 MG/ML IV SOLN: 4.0000 mg | Freq: Once | INTRAVENOUS | Status: DC

## 2019-04-25 MED ORDER — FENTANYL CITRATE (PF) 100 MCG/2ML IJ SOLN
50.0000 ug | Freq: Once | INTRAMUSCULAR | Status: AC
  Administered 2019-04-25: 50 ug via INTRAVENOUS
  Filled 2019-04-25: qty 2

## 2019-04-25 MED ORDER — SODIUM CHLORIDE 0.9 % IV BOLUS
1000.0000 mL | Freq: Once | INTRAVENOUS | Status: AC
  Administered 2019-04-25: 1000 mL via INTRAVENOUS

## 2019-04-25 MED ORDER — SODIUM CHLORIDE 0.9% FLUSH: 3.0000 mL | Freq: Once | INTRAVENOUS | Status: DC

## 2019-04-25 MED ORDER — MORPHINE SULFATE (PF) 4 MG/ML IV SOLN
4.0000 mg | Freq: Once | INTRAVENOUS | Status: AC
  Administered 2019-04-25: 4 mg via INTRAVENOUS
  Filled 2019-04-25: qty 1

## 2019-04-25 MED ORDER — HYDROMORPHONE HCL 1 MG/ML IJ SOLN
1.0000 mg | Freq: Once | INTRAMUSCULAR | Status: AC
  Administered 2019-04-25: 1 mg via INTRAVENOUS
  Filled 2019-04-25: qty 1

## 2019-04-25 NOTE — ED Triage Notes (Signed)
Patient reports to the ED for complaint of abdominal pain. Patient was recently diagnosed with cancer and reports he does not plan to undergo chemotherapy. Patient reports pain 4/10, was 10/10 before 115mcgs of fentanyl given by EMS

## 2019-04-25 NOTE — ED Notes (Signed)
Patient is stating he was never given any pain medication and what we are giving him is a placebo. Patient received 4mg  of morphine at 1905. Please refer to [facility].

## 2019-04-25 NOTE — ED Provider Notes (Signed)
[facility] DEPT Provider Note   CSN: [id number] Arrival date & time: 04/25/19  1705     History Chief Complaint  Patient presents with  . Abdominal Pain    Kalub Morillo is a 86 y.o. male.  The history is provided by the patient and medical records. No language interpreter was used.  Abdominal Pain  Ballard Budney is a 86 y.o. male who presents to the Emergency Department complaining of chest pain, abdominal pain. He presents to the emergency department by EMS from home for evaluation of chest pain and abdominal pain that began this morning. He states that he has chronic pain in this area but it is significantly more severe today. Pain is located in a central chest and radiates throughout his entire abdomen. It is slightly greater on the right hemi abdomen. Pain is described as a pressure. It is constant in nature. He has associated shortness of breath and cough. Vomiting began today. He denies any fevers, diarrhea. He was recently hospitalized and diagnosed with metastatic cancer and pulmonary embolism. He is been compliant with his medications. He states he is taking Tylenol for pain and this does not seem to help. He went to the [place] yesterday for follow-up after hospital discharge. He states that they did nothing for him and all they did was talk. When asked what his goals are he states that he wants his pain controlled and he wants to die peacefully.    Past Medical History:  Diagnosis Date  . A-fib ([place])   . Hypertension     Patient Active Problem List   Diagnosis Date Noted  . Pulmonary embolism ([place]) 04/21/2019  . Lung mass 04/21/2019  . Essential hypertension 04/21/2019  . Elevated LFTs 04/21/2019  . ARF (acute renal failure) ([place]) 04/21/2019    No past surgical history on file.     Family History  Problem Relation Age of Onset  . Diabetes Mellitus II Neg Hx     Social History   Tobacco Use  . Smoking status: Former Research scientist (life sciences)  .  Smokeless tobacco: Never Used  Substance Use Topics  . Alcohol use: Not Currently  . Drug use: Not Currently    Home Medications Prior to Admission medications   Medication Sig Start Date End Date Taking? Authorizing Provider  amLODipine (NORVASC) 2.5 MG tablet Take 2.5 mg by mouth daily.   Yes [provider]  atorvastatin (LIPITOR) 40 MG tablet Take 40 mg by mouth daily.   Yes [provider]  carvedilol (COREG) 12.5 MG tablet Take 12.5 mg by mouth 2 (two) times daily with a meal.   Yes [provider]  doxazosin (CARDURA) 8 MG tablet Take 8 mg by mouth daily.   Yes [provider]  glimepiride (AMARYL) 2 MG tablet Take 1 tablet (2 mg total) by mouth daily with breakfast. 04/22/19 04/21/20 Yes Rai, Ripudeep K, MD  loratadine (CLARITIN) 10 MG tablet Take 1 tablet (10 mg total) by mouth daily as needed for allergies or rhinitis. 04/22/19  Yes Rai, Ripudeep K, MD  losartan (COZAAR) 100 MG tablet Take 100 mg by mouth daily.   Yes [provider]  omeprazole (PRILOSEC) 20 MG capsule Take 20 mg by mouth daily.   Yes [provider]  primidone (MYSOLINE) 50 MG tablet Take 100 mg by mouth every morning.   Yes [provider]  Rivaroxaban 15 & 20 MG TBPK Follow package directions: Take one 15mg  tablet by mouth twice a day. On  day 22 (05/13/19), switch to one 20mg  tablet once a day. Take with food. 04/22/19  Yes Rai, Ripudeep K, MD  sertraline (ZOLOFT) 100 MG tablet Take 100 mg by mouth daily.   Yes [provider]  temazepam (RESTORIL) 30 MG capsule Take 30 mg by mouth at bedtime as needed for sleep.   Yes [provider]  rivaroxaban (XARELTO) 20 MG TABS tablet Take 1 tablet (20 mg total) by mouth daily with supper. Start once the starter pack has been completed 05/13/19   Rai, Vernelle Emerald, MD    Allergies    Patient has no known allergies.  Review of Systems   Review of Systems  Gastrointestinal: Positive for abdominal  pain.  All other systems reviewed and are negative.   Physical Exam Updated Vital Signs BP (!) 144/58   Pulse 84   Temp 98.6 F (37 C) (Oral)   Resp 17   Ht 5\' 9"  (1.753 m)   Wt 96 kg   SpO2 95%   BMI 31.25 kg/m   Physical Exam Vitals and nursing note reviewed.  Constitutional:      Appearance: He is well-developed.  HENT:     Head: Normocephalic and atraumatic.  Cardiovascular:     Rate and Rhythm: Regular rhythm. Tachycardia present.     Heart sounds: No murmur.  Pulmonary:     Effort: Pulmonary effort is normal. No respiratory distress.  Abdominal:     Palpations: Abdomen is soft.     Tenderness: There is no guarding or rebound.     Comments: Mild generalized abdominal tenderness  Musculoskeletal:        General: No swelling or tenderness.  Skin:    General: Skin is warm and dry.  Neurological:     Mental Status: He is alert and oriented to person, place, and time.  Psychiatric:        Behavior: Behavior normal.     ED Results / Procedures / Treatments   Labs (all labs ordered are listed, but only abnormal results are displayed) Labs Reviewed  LIPASE, BLOOD - Abnormal; Notable for the following components:      Result Value   Lipase 2,207 (*)    All other components within normal limits  COMPREHENSIVE METABOLIC PANEL - Abnormal; Notable for the following components:   Sodium 130 (*)    Chloride 96 (*)    Glucose, Bld 184 (*)    Calcium 8.7 (*)    Albumin 2.6 (*)    AST 287 (*)    ALT 200 (*)    Alkaline Phosphatase 569 (*)    Total Bilirubin 4.3 (*)    All other components within normal limits  CBC - Abnormal; Notable for the following components:   WBC 15.4 (*)    All other components within normal limits  URINALYSIS, ROUTINE W REFLEX MICROSCOPIC - Abnormal; Notable for the following components:   Color, Urine AMBER (*)    Hgb urine dipstick SMALL (*)    Crystals PRESENT (*)    All other components within normal limits  BRAIN NATRIURETIC  PEPTIDE - Abnormal; Notable for the following components:   B Natriuretic Peptide 128.3 (*)    All other components within normal limits  SARS CORONAVIRUS 2 (TAT 6-24 HRS)  TROPONIN I (HIGH SENSITIVITY)  TROPONIN I (HIGH SENSITIVITY)    EKG None  Radiology DG Chest Port 1 View  Result Date: 04/25/2019 CLINICAL DATA:  Chest pain and shortness of breath. EXAM: PORTABLE CHEST 1  VIEW COMPARISON:  Chest plain film and chest CT, dated April 20, 2019, are available for comparison. FINDINGS: A stable patchy area of increased opacification is again seen within the mid left lung. A stable similar-appearing area is seen overlying the upper right lung. There is no evidence of a pleural effusion or pneumothorax. The heart size and mediastinal contours are within normal limits. The visualized skeletal structures are unremarkable. IMPRESSION: Stable patchy opacities within the mid left lung and right upper lobe, consistent with the bilateral lung masses seen within these regions on the prior chest CT dated April 20, 2019. Electronically Signed   By: Virgina Norfolk M.D.   On: 04/25/2019 18:09    Procedures Procedures (including critical care time)  Medications Ordered in ED Medications  sodium chloride flush (NS) 0.9 % injection 3 mL (3 mLs Intravenous Not Given 04/25/19 1803)  fentaNYL (SUBLIMAZE) injection 50 mcg (50 mcg Intravenous Given 04/25/19 1749)  morphine 4 MG/ML injection 4 mg (4 mg Intravenous Given 04/25/19 1905)  sodium chloride 0.9 % bolus 1,000 mL (1,000 mLs Intravenous New Bag/Given 04/25/19 1957)  HYDROmorphone (DILAUDID) injection 1 mg (1 mg Intravenous Given 04/25/19 1957)  HYDROmorphone (DILAUDID) injection 1 mg (1 mg Intravenous Given 04/25/19 2148)    ED Course  I have reviewed the triage vital signs and the nursing notes.  Pertinent labs & imaging results that were available during my care of the patient were reviewed by me and considered in my medical decision making (see chart  for details).    MDM Rules/Calculators/A&P                     Patient with recent diagnosis of metastatic cancer as well as pulmonary embolism on Xarelto here for evaluation of progressive chest and abdominal pain. He is ill appearing on evaluation but without respiratory distress. Labs are significant for pancreatitis, new compared to recent admission. Patient is requesting comfort measures with hospice services. He states he does not want additional workup for his symptoms. He was treated with IV fluids, pain control. Hospitalist consulted for admission for further treatment.  Final Clinical Impression(s) / ED Diagnoses Final diagnoses:  Acute pancreatitis without infection or necrosis, unspecified pancreatitis type    Rx / DC Orders ED Discharge Orders    None       Quintella Reichert, MD 04/25/19 2216

## 2019-04-25 NOTE — H&P (Signed)
History and Physical    Dennis Stephens MWN:[id number] DOB: 1933/04/23 DOA: 04/25/2019  PCP: [facility]  Patient coming from: Home  Chief Complaint: Abdominal pain  HPI: Dennis Stephens is a 86 y.o. male with medical history significant of HTN, Afib, recent diagnosis of PE bilateral and likely metastatic cancer with spiculated pulmonary nodules who presents for abdominal pain.  Dennis Stephens reports that the pain is 10/10, sharp in the upper quadrants of his abdomen.  Associated at times with nausea and vomiting.  He further has abdominal swelling, yellowing of the eyes, and yellowing under the tongue.  He was recently admitted for chest pain and found to have PE for which he was started on xarelto.  At that time, he had an elevated A1C and was started on amaryl as well.  He declined further work up for pulmonary nodules and lymphadenopathy opting instead to follow up at the [place].  However, he declined treatment and work up there as well. He is requesting an evaluation by hospice.  His pain has been moderately controlled with fentanyl and morphine in the ED.  He has had 1 bolus of fluids.  He continues to have 5/10 abdominal pain.   Otherwise denies SOB, fever, chills, swallowing problems, back pain, leg swelling, urinary symptoms, change in urinary habits.   ED Course: In the ED, he was found to have an elevated lipase, elevated AST/ALT, elevated ALP and elevated T bili.  He further had a low Na, low albumin, elevated WBC.  CXR showed known lung masses which were seen on CT scan on 3/10.    Review of Systems: As per HPI otherwise 10 point review of systems negative.   Past Medical History:  Diagnosis Date  . A-fib ([place])   . DM2 (diabetes mellitus, type 2) ([place])   . Hypertension     Past Surgical History:  Procedure Laterality Date  . LIPOMA EXCISION       reports that he has quit smoking. He has never used smokeless tobacco. He reports previous alcohol use. He reports previous drug  use.  No Known Allergies  Family History  Problem Relation Age of Onset  . Hypertension Mother   . Hypertension Father   . Diabetes Mellitus II Neg Hx      Prior to Admission medications   Medication Sig Start Date End Date Taking? Authorizing Provider  amLODipine (NORVASC) 2.5 MG tablet Take 2.5 mg by mouth daily.   Yes [provider]  atorvastatin (LIPITOR) 40 MG tablet Take 40 mg by mouth daily.   Yes [provider]  carvedilol (COREG) 12.5 MG tablet Take 12.5 mg by mouth 2 (two) times daily with a meal.   Yes [provider]  doxazosin (CARDURA) 8 MG tablet Take 8 mg by mouth daily.   Yes [provider]  glimepiride (AMARYL) 2 MG tablet Take 1 tablet (2 mg total) by mouth daily with breakfast. 04/22/19 04/21/20 Yes Rai, Ripudeep K, MD  loratadine (CLARITIN) 10 MG tablet Take 1 tablet (10 mg total) by mouth daily as needed for allergies or rhinitis. 04/22/19  Yes Rai, Ripudeep K, MD  losartan (COZAAR) 100 MG tablet Take 100 mg by mouth daily.   Yes [provider]  omeprazole (PRILOSEC) 20 MG capsule Take 20 mg by mouth daily.   Yes [provider]  primidone (MYSOLINE) 50 MG tablet Take 100 mg by mouth every morning.   Yes [provider]  Rivaroxaban 15 & 20 MG TBPK Follow  package directions: Take one 15mg  tablet by mouth twice a day. On day 22 (05/13/19), switch to one 20mg  tablet once a day. Take with food. 04/22/19  Yes Rai, Ripudeep K, MD  sertraline (ZOLOFT) 100 MG tablet Take 100 mg by mouth daily.   Yes [provider]  temazepam (RESTORIL) 30 MG capsule Take 30 mg by mouth at bedtime as needed for sleep.   Yes [provider]  rivaroxaban (XARELTO) 20 MG TABS tablet Take 1 tablet (20 mg total) by mouth daily with supper. Start once the starter pack has been completed 05/13/19   Mendel Corning, MD    Physical Exam: Vitals:   04/25/19 2000 04/25/19 2030 04/25/19 2100 04/25/19 2218  BP: (!) 96/59  (!) 126/43 (!) 144/58 (!) 110/52  Pulse: 84 84 84 94  Resp: 14 19 17 20   Temp:      TempSrc:      SpO2: 92% 94% 95% 92%  Weight:      Height:        Constitutional: NAD, calm, comfortable, lying in bed Eyes: PERRL, + scleral icterus ENMT: Mucous membranes are dry. Posterior pharynx clear of any exudate or lesions.   Neck: normal, supple Respiratory: CTAB, no wheezing, wearing oxygen for comfort Cardiovascular: RR, NR, no murmur Abdomen: + TTP in epigastrium, + voluntary guarding, + distention, no suprapubic tenderness Musculoskeletal: no clubbing / cyanosis.  Normal muscle tone for age.  Skin: no rashes, lesions, ulcers. On exposed skin.  Chronic skin changes due to sun damage on arms, face, head Neurologic: CN 2-12 grossly intact. Sensation intact.  Strength 5/5 in all 4.  Psychiatric: Alert and oriented x 3. Flat affect.    Labs on Admission: I have personally reviewed following labs and imaging studies  CBC: Recent Labs  Lab 04/20/19 2247 04/21/19 0510 04/22/19 0734 04/25/19 1721  WBC 14.9* 18.1* 14.9* 15.4*  HGB 15.3 14.5 12.9* 14.2  HCT 46.3 42.4 38.1* 43.0  MCV 93.3 92.0 90.9 92.1  PLT 287 313 238 341   Basic Metabolic Panel: Recent Labs  Lab 04/20/19 2247 04/21/19 0707 04/22/19 0734 04/25/19 1721  NA 131* 129* 127* 130*  K 4.4 4.5 4.3 3.8  CL 95* 94* 96* 96*  CO2 22 21* 20* 22  GLUCOSE 357* 433* 206* 184*  BUN 20 17 32* 15  CREATININE 1.34* 1.37* 1.46* 1.10  CALCIUM 9.3 9.1 8.5* 8.7*   GFR: Estimated Creatinine Clearance: 58.2 mL/min (by C-G formula based on SCr of 1.1 mg/dL). Liver Function Tests: Recent Labs  Lab 04/20/19 2247 04/21/19 0707 04/25/19 1721  AST 216* 326* 287*  ALT 155* 223* 200*  ALKPHOS 468* 472* 569*  BILITOT 4.3* 6.8* 4.3*  PROT 7.6 7.4 6.7  ALBUMIN 3.7 3.4* 2.6*   Recent Labs  Lab 04/20/19 2247 04/25/19 1721  LIPASE 39 2,207*   No results for input(s): AMMONIA in the last 168 hours. Coagulation Profile: No  results for input(s): INR, PROTIME in the last 168 hours. Cardiac Enzymes: No results for input(s): CKTOTAL, CKMB, CKMBINDEX, TROPONINI in the last 168 hours. BNP (last 3 results) No results for input(s): PROBNP in the last 8760 hours. HbA1C: No results for input(s): HGBA1C in the last 72 hours. CBG: Recent Labs  Lab 04/21/19 1159 04/21/19 1635 04/21/19 2106 04/22/19 0754 04/22/19 0916  GLUCAP 365* 197* 262* 204* 190*   Lipid Profile: No results for input(s): CHOL, HDL, LDLCALC, TRIG, CHOLHDL, LDLDIRECT in the last 72 hours. Thyroid Function Tests: No results for  input(s): TSH, T4TOTAL, FREET4, T3FREE, THYROIDAB in the last 72 hours. Anemia Panel: No results for input(s): VITAMINB12, FOLATE, FERRITIN, TIBC, IRON, RETICCTPCT in the last 72 hours. Urine analysis:    Component Value Date/Time   COLORURINE AMBER (A) 04/25/2019 1721   APPEARANCEUR CLEAR 04/25/2019 1721   LABSPEC 1.006 04/25/2019 1721   PHURINE 5.0 04/25/2019 1721   GLUCOSEU NEGATIVE 04/25/2019 1721   HGBUR SMALL (A) 04/25/2019 1721   BILIRUBINUR NEGATIVE 04/25/2019 1721   KETONESUR NEGATIVE 04/25/2019 1721   PROTEINUR NEGATIVE 04/25/2019 1721   NITRITE NEGATIVE 04/25/2019 1721   LEUKOCYTESUR NEGATIVE 04/25/2019 1721    Radiological Exams on Admission: DG Chest Port 1 View  Result Date: 04/25/2019 CLINICAL DATA:  Chest pain and shortness of breath. EXAM: PORTABLE CHEST 1 VIEW COMPARISON:  Chest plain film and chest CT, dated April 20, 2019, are available for comparison. FINDINGS: A stable patchy area of increased opacification is again seen within the mid left lung. A stable similar-appearing area is seen overlying the upper right lung. There is no evidence of a pleural effusion or pneumothorax. The heart size and mediastinal contours are within normal limits. The visualized skeletal structures are unremarkable. IMPRESSION: Stable patchy opacities within the mid left lung and right upper lobe, consistent with the  bilateral lung masses seen within these regions on the prior chest CT dated April 20, 2019. Electronically Signed   By: Virgina Norfolk M.D.   On: 04/25/2019 18:09     Assessment/Plan Acute pancreatitis Elevated LFTs - IVF with NS at 150cc/hr - Pain management with morphine - Nausea medicine with Zofran - Clears for trial of early feeding, hold for vomiting or worsening pain - Senna, docusate to start to avoid constipation, may need a stronger regimen - Possibly related to worsening of liver mass and bile duct dilation as seen on CT scan of 04/20/19 - RUQ ultrasound ordered, if patient can tolerate    Pulmonary embolism - Continue Xarelto, he is currently on the 15mg  BID dosing  Lung mass - He has declined further work up and would like to speak with palliative care, consult placed for evaluation tomorrow.   - Pain management - Continue sertraline, temazepam    Essential hypertension - Continue coreg - Hold losartan and amlodipine in acute illness and decreased BP - Hold doxazosin  Diabetes 2 on Amaryl - Hold amaryl at this time - SSI  Tremor? - Continue primidone, hold if any worsening effects on current symptoms.      DVT prophylaxis: Xarelto  Code Status: DNR, confirmed with patient Family Communication: Wife Mary on the phone  Disposition Plan: Admit for pain control, management of pancreatitis  Consults called: Palliative care will need to be consulted in the AM  Admission status: Med Surg, INP    Gilles Chiquito MD [facility]  If 7PM-7AM, please contact night-coverage [email or web address] Use universal [place] password for that web site. If you do not have the password, please call the hospital operator.  04/25/2019, 10:32 PM

## 2019-04-26 ENCOUNTER — Inpatient Hospital Stay (HOSPITAL_COMMUNITY): Payer: No Typology Code available for payment source

## 2019-04-26 DIAGNOSIS — K851 Biliary acute pancreatitis without necrosis or infection: Principal | ICD-10-CM

## 2019-04-26 DIAGNOSIS — I10 Essential (primary) hypertension: Secondary | ICD-10-CM

## 2019-04-26 DIAGNOSIS — R7989 Other specified abnormal findings of blood chemistry: Secondary | ICD-10-CM

## 2019-04-26 LAB — COMPREHENSIVE METABOLIC PANEL
ALT: 298 U/L — ABNORMAL HIGH (ref 0–44)
AST: 506 U/L — ABNORMAL HIGH (ref 15–41)
Albumin: 2.3 g/dL — ABNORMAL LOW (ref 3.5–5.0)
Alkaline Phosphatase: 551 U/L — ABNORMAL HIGH (ref 38–126)
Anion gap: 10 (ref 5–15)
BUN: 23 mg/dL (ref 8–23)
CO2: 20 mmol/L — ABNORMAL LOW (ref 22–32)
Calcium: 8 mg/dL — ABNORMAL LOW (ref 8.9–10.3)
Chloride: 98 mmol/L (ref 98–111)
Creatinine, Ser: 1.55 mg/dL — ABNORMAL HIGH (ref 0.61–1.24)
GFR calc Af Amer: 47 mL/min — ABNORMAL LOW (ref >60)
GFR calc non Af Amer: 41 mL/min — ABNORMAL LOW (ref >60)
Glucose, Bld: 232 mg/dL — ABNORMAL HIGH (ref 70–99)
Potassium: 4.9 mmol/L (ref 3.5–5.1)
Sodium: 128 mmol/L — ABNORMAL LOW (ref 135–145)
Total Bilirubin: 6.4 mg/dL — ABNORMAL HIGH (ref 0.3–1.2)
Total Protein: 6 g/dL — ABNORMAL LOW (ref 6.5–8.1)

## 2019-04-26 LAB — CBC
HCT: 37.3 % — ABNORMAL LOW (ref 39.0–52.0)
Hemoglobin: 12.2 g/dL — ABNORMAL LOW (ref 13.0–17.0)
MCH: 30.8 pg (ref 26.0–34.0)
MCHC: 32.7 g/dL (ref 30.0–36.0)
MCV: 94.2 fL (ref 80.0–100.0)
Platelets: 288 10*3/uL (ref 150–400)
RBC: 3.96 MIL/uL — ABNORMAL LOW (ref 4.22–5.81)
RDW: 14.6 % (ref 11.5–15.5)
WBC: 26.5 10*3/uL — ABNORMAL HIGH (ref 4.0–10.5)
nRBC: 0 % (ref 0.0–0.2)

## 2019-04-26 LAB — GLUCOSE, CAPILLARY
Glucose-Capillary: 125 mg/dL — ABNORMAL HIGH (ref 70–99)
Glucose-Capillary: 176 mg/dL — ABNORMAL HIGH (ref 70–99)
Glucose-Capillary: 197 mg/dL — ABNORMAL HIGH (ref 70–99)
Glucose-Capillary: 219 mg/dL — ABNORMAL HIGH (ref 70–99)
Glucose-Capillary: 75 mg/dL (ref 70–99)
Glucose-Capillary: 87 mg/dL (ref 70–99)
Glucose-Capillary: 94 mg/dL (ref 70–99)

## 2019-04-26 LAB — SARS CORONAVIRUS 2 (TAT 6-24 HRS): SARS Coronavirus 2: NEGATIVE

## 2019-04-26 LAB — PROTIME-INR
INR: 1.2 (ref 0.8–1.2)
Prothrombin Time: 15.4 seconds — ABNORMAL HIGH (ref 11.4–15.2)

## 2019-04-26 LAB — APTT: aPTT: 38 seconds — ABNORMAL HIGH (ref 24–36)

## 2019-04-26 LAB — HEPARIN LEVEL (UNFRACTIONATED): Heparin Unfractionated: 1.72 IU/mL — ABNORMAL HIGH (ref 0.30–0.70)

## 2019-04-26 LAB — LIPASE, BLOOD: Lipase: 1879 U/L — ABNORMAL HIGH (ref 11–51)

## 2019-04-26 MED ORDER — ONDANSETRON HCL 4 MG/2ML IJ SOLN
4.0000 mg | Freq: Four times a day (QID) | INTRAMUSCULAR | Status: DC | PRN
  Administered 2019-04-27 – 2019-05-02 (×3): 4 mg via INTRAVENOUS
  Filled 2019-04-26 (×3): qty 2

## 2019-04-26 MED ORDER — RIVAROXABAN 20 MG PO TABS: 20.0000 mg | ORAL_TABLET | Freq: Every day | ORAL | Status: DC

## 2019-04-26 MED ORDER — TEMAZEPAM 15 MG PO CAPS
30.0000 mg | ORAL_CAPSULE | Freq: Every evening | ORAL | Status: DC | PRN
  Administered 2019-04-26 – 2019-05-06 (×10): 30 mg via ORAL
  Filled 2019-04-26 (×10): qty 2

## 2019-04-26 MED ORDER — PIPERACILLIN-TAZOBACTAM 3.375 G IVPB
3.3750 g | Freq: Three times a day (TID) | INTRAVENOUS | Status: AC
  Administered 2019-04-26 – 2019-05-02 (×18): 3.375 g via INTRAVENOUS
  Filled 2019-04-26 (×17): qty 50

## 2019-04-26 MED ORDER — MORPHINE SULFATE (PF) 2 MG/ML IV SOLN
2.0000 mg | INTRAVENOUS | Status: DC | PRN
  Administered 2019-04-27 – 2019-05-06 (×8): 2 mg via INTRAVENOUS
  Filled 2019-04-26 (×10): qty 1

## 2019-04-26 MED ORDER — SODIUM CHLORIDE 0.9 % IV SOLN: INTRAVENOUS | Status: DC

## 2019-04-26 MED ORDER — PANTOPRAZOLE SODIUM 40 MG IV SOLR
40.0000 mg | INTRAVENOUS | Status: DC
  Administered 2019-04-27 – 2019-05-02 (×4): 40 mg via INTRAVENOUS
  Filled 2019-04-26 (×4): qty 40

## 2019-04-26 MED ORDER — LORATADINE 10 MG PO TABS
10.0000 mg | ORAL_TABLET | Freq: Every day | ORAL | Status: DC | PRN
  Administered 2019-04-29: 10 mg via ORAL
  Filled 2019-04-26: qty 1

## 2019-04-26 MED ORDER — GADOBUTROL 1 MMOL/ML IV SOLN
10.0000 mL | Freq: Once | INTRAVENOUS | Status: AC | PRN
  Administered 2019-04-26: 10 mL via INTRAVENOUS

## 2019-04-26 MED ORDER — MORPHINE SULFATE (PF) 4 MG/ML IV SOLN
4.0000 mg | INTRAVENOUS | Status: DC | PRN
  Administered 2019-04-26 (×2): 4 mg via INTRAVENOUS
  Filled 2019-04-26 (×2): qty 1

## 2019-04-26 MED ORDER — RIVAROXABAN 15 MG PO TABS
15.0000 mg | ORAL_TABLET | Freq: Two times a day (BID) | ORAL | Status: DC
  Administered 2019-04-26: 15 mg via ORAL
  Filled 2019-04-26: qty 1

## 2019-04-26 MED ORDER — HEPARIN (PORCINE) 25000 UT/250ML-% IV SOLN
1250.0000 [IU]/h | INTRAVENOUS | Status: AC
  Administered 2019-04-26: 1250 [IU]/h via INTRAVENOUS
  Filled 2019-04-26: qty 250

## 2019-04-26 MED ORDER — PANTOPRAZOLE SODIUM 40 MG PO TBEC
40.0000 mg | DELAYED_RELEASE_TABLET | Freq: Every day | ORAL | Status: DC
  Administered 2019-04-26: 40 mg via ORAL
  Filled 2019-04-26: qty 1

## 2019-04-26 MED ORDER — CARVEDILOL 12.5 MG PO TABS
12.5000 mg | ORAL_TABLET | Freq: Two times a day (BID) | ORAL | Status: DC
  Administered 2019-04-26 – 2019-05-02 (×12): 12.5 mg via ORAL
  Filled 2019-04-26 (×14): qty 1

## 2019-04-26 MED ORDER — INSULIN ASPART 100 UNIT/ML ~~LOC~~ SOLN
0.0000 [IU] | SUBCUTANEOUS | Status: DC
  Administered 2019-04-26: 01:00:00 5 [IU] via SUBCUTANEOUS
  Administered 2019-04-26 (×2): 3 [IU] via SUBCUTANEOUS
  Administered 2019-04-27 (×2): 2 [IU] via SUBCUTANEOUS
  Administered 2019-04-27: 8 [IU] via SUBCUTANEOUS
  Administered 2019-04-27 – 2019-04-28 (×2): 5 [IU] via SUBCUTANEOUS
  Administered 2019-04-28: 2 [IU] via SUBCUTANEOUS
  Administered 2019-04-28: 3 [IU] via SUBCUTANEOUS
  Administered 2019-04-28: 8 [IU] via SUBCUTANEOUS
  Administered 2019-04-28 – 2019-04-29 (×3): 3 [IU] via SUBCUTANEOUS
  Administered 2019-04-29: 2 [IU] via SUBCUTANEOUS
  Administered 2019-04-29: 3 [IU] via SUBCUTANEOUS
  Administered 2019-04-30 (×2): 2 [IU] via SUBCUTANEOUS
  Administered 2019-04-30 – 2019-05-01 (×4): 3 [IU] via SUBCUTANEOUS
  Administered 2019-05-01: 5 [IU] via SUBCUTANEOUS
  Administered 2019-05-01 (×3): 3 [IU] via SUBCUTANEOUS
  Administered 2019-05-02: 2 [IU] via SUBCUTANEOUS
  Administered 2019-05-02 – 2019-05-03 (×5): 3 [IU] via SUBCUTANEOUS
  Administered 2019-05-03: 5 [IU] via SUBCUTANEOUS
  Administered 2019-05-03 – 2019-05-04 (×4): 3 [IU] via SUBCUTANEOUS
  Administered 2019-05-04: 2 [IU] via SUBCUTANEOUS
  Administered 2019-05-04: 3 [IU] via SUBCUTANEOUS
  Administered 2019-05-04: 2 [IU] via SUBCUTANEOUS
  Administered 2019-05-04 – 2019-05-05 (×3): 3 [IU] via SUBCUTANEOUS
  Administered 2019-05-05 (×3): 2 [IU] via SUBCUTANEOUS
  Administered 2019-05-05: 3 [IU] via SUBCUTANEOUS
  Administered 2019-05-06 (×3): 2 [IU] via SUBCUTANEOUS

## 2019-04-26 MED ORDER — PRIMIDONE 50 MG PO TABS
100.0000 mg | ORAL_TABLET | Freq: Every day | ORAL | Status: DC
  Administered 2019-04-26 – 2019-05-04 (×8): 100 mg via ORAL
  Filled 2019-04-26 (×10): qty 2

## 2019-04-26 MED ORDER — PIPERACILLIN-TAZOBACTAM 3.375 G IVPB 30 MIN
3.3750 g | Freq: Once | INTRAVENOUS | Status: AC
  Administered 2019-04-26: 3.375 g via INTRAVENOUS
  Filled 2019-04-26: qty 50

## 2019-04-26 MED ORDER — DOCUSATE SODIUM 100 MG PO CAPS
100.0000 mg | ORAL_CAPSULE | Freq: Two times a day (BID) | ORAL | Status: DC
  Administered 2019-04-26 – 2019-05-02 (×12): 100 mg via ORAL
  Filled 2019-04-26 (×18): qty 1

## 2019-04-26 MED ORDER — GUAIFENESIN ER 600 MG PO TB12
600.0000 mg | ORAL_TABLET | Freq: Two times a day (BID) | ORAL | Status: DC
  Administered 2019-04-26 – 2019-04-30 (×9): 600 mg via ORAL
  Filled 2019-04-26 (×9): qty 1

## 2019-04-26 MED ORDER — SERTRALINE HCL 100 MG PO TABS
100.0000 mg | ORAL_TABLET | Freq: Every day | ORAL | Status: DC
  Administered 2019-04-26 – 2019-05-06 (×10): 100 mg via ORAL
  Filled 2019-04-26 (×10): qty 1

## 2019-04-26 MED ORDER — SENNA 8.6 MG PO TABS
1.0000 | ORAL_TABLET | Freq: Two times a day (BID) | ORAL | Status: DC
  Administered 2019-04-27 – 2019-05-06 (×12): 8.6 mg via ORAL
  Filled 2019-04-26 (×18): qty 1

## 2019-04-26 NOTE — Progress Notes (Addendum)
[facility]                                                                              Patient Demographics  Dennis Stephens, is a 86 y.o. male, DOB - 06/23/1933, MEQ:[id number]  Admit date - 04/25/2019   Admitting Physician Sid Falcon, MD  Outpatient Primary MD for the patient is [place]  Outpatient specialists:   LOS - 1  days   Medical records reviewed and are as summarized below:    Chief Complaint  Patient presents with  . Abdominal Pain       Brief summary   Patient is a 86 year old male with history of hypertension, A. fib, recent diagnosis of PE, likely metastatic CA with spiculated pulmonary nodules presented with abdominal pain.  Patient reported pain 10/10 in upper quadrants, nausea and vomiting.  Patient also reported abdominal swelling, yellowing of his eyes and under the tongue.  Patient was recently admitted (04/20/2019-04/22/2019) for chest pain and was found to have PE for which he was started on Xarelto.  Patient was also found to have hilar and mediastinal lymphadenopathy with possible liver mets, spiculated pulmonary nodules during previous admission, he was scheduled for EBUS and bronchoscopy, declined further work-up inpatient and was discharged at his request.  He is requesting evaluation for hospice and palliative care. In ED, patient was found to have elevated lipase, 2207, elevated AST ALT alk phos 569 with leukocytosis Abdominal ultrasound showed normal gallbladder, biliary duct dilatation with choledocholithiasis, recommended ERCP  Assessment & Plan    Principal Problem:   Acute pancreatitis secondary to choledocholithiasis, acute transaminitis -States pain is somewhat better with morphine, will continue pain control -Continue n.p.o. status, IV fluids -Leukocytosis worsening, 15.4 on admission now 26.5, will place on IV Zosyn, obtain blood cultures, concern for cholangitis -GI has been consulted, discussed in detail with  the patient regarding ERCP, wants to proceed if recommended by GI - will hold Xarelto, if ERCP is needed, awaiting GI recommendations  Active Problems:   Pulmonary embolism ([place]), recent -Likely due to underlying malignancy, currently on 15 mg twice daily dosing -Patient was placed on Xarelto, will hold next dose until GI evaluation and if ERCP needed   hilar and mediastinal lymphadenopathy, spiculated pulmonary nodules -Seen on CT during previous admission, pulmonology was consulted, patient was scheduled for EBUS and bronchoscopy next day on 04/23/2019.  Patient however declined to have any further work-up here of the lung mass/malignancy. -Palliative care consult has been placed per patient's request  Hyponatremia -Likely due to poor p.o. intake, pseudohyponatremia secondary to hyperglycemia - Na 128, corrected 130 with hyperglycemia -Continue IV fluids    Essential hypertension -Continue Coreg - hold losartan, amlodipine.  BP currently soft  Diabetes mellitus type 2, NIDDM -Hold Amaryl, continue sliding scale insulin  Tremor Continue primidone, hold if any worsening effects   Code Status: DNR DVT Prophylaxis: On Xarelto, will place on hold until GI evaluation Family Communication: Discussed all imaging results, lab results, explained to the patient   Disposition Plan: Patient from home, anticipated discharge home once tolerating solid diet, awaiting GI evaluation and palliative recommendations.  Currently  with acute pancreatitis, n.p.o., IV fluids, precludes safe discharge home  Time Spent in minutes 35 minutes  Procedures:  None  Consultants:   Gastroenterology Palliative medicine  Antimicrobials:   Anti-infectives (From admission, onward)   None         Medications  Scheduled Meds: . carvedilol  12.5 mg Oral BID WC  . docusate sodium  100 mg Oral BID  . insulin aspart  0-15 Units Subcutaneous Q4H  . pantoprazole  40 mg Oral Daily  . primidone  100 mg  Oral Daily  . Rivaroxaban  15 mg Oral BID WC   Followed by  . [START ON 05/17/2019] rivaroxaban  20 mg Oral Q supper  . senna  1 tablet Oral BID  . sertraline  100 mg Oral Daily  . sodium chloride flush  3 mL Intravenous Once   Continuous Infusions: . sodium chloride 125 mL/hr at 04/26/19 0841   PRN Meds:.loratadine, morphine injection, ondansetron (ZOFRAN) IV, temazepam      Subjective:   Dennis Stephens was seen and examined today.  States abdominal pain is somewhat better controlled with morphine, 5/10, epigastric.  Currently no nausea or vomiting.  No chest pain. Patient denies dizziness, chest pain, shortness of breath, new weakness, numbess, tingling.   Objective:   Vitals:   04/25/19 2218 04/25/19 2300 04/25/19 2328 04/26/19 0544  BP: (!) 110/52 (!) 97/49 (!) 101/56 126/61  Pulse: 94 94 93 77  Resp: '20 17 16 17  '$ Temp:   98 F (36.7 C) 97.8 F (36.6 C)  TempSrc:   Oral Oral  SpO2: 92% 94% 96% 97%  Weight:      Height:        Intake/Output Summary (Last 24 hours) at 04/26/2019 1132 Last data filed at 04/26/2019 6659 Gross per 24 hour  Intake 1000 ml  Output 175 ml  Net 825 ml     Wt Readings from Last 3 Encounters:  04/25/19 96 kg  04/20/19 96.6 kg  01/20/19 95.3 kg     Exam  General: Alert and oriented x 3, NAD, scleral icterus  Cardiovascular: S1 S2 auscultated, no murmurs, RRR  Respiratory: Clear to auscultation bilaterally, no wheezing  Gastrointestinal: Soft, epigastric TTP, +distended, + bowel sounds  Ext: no pedal edema bilaterally  Neuro: No new deficits  Musculoskeletal: No digital cyanosis, clubbing  Skin: Chronic skin changes on arms and face  Psych: Normal affect and demeanor, alert and oriented x3    Data Reviewed:  I have personally reviewed following labs and imaging studies  Micro Results Recent Results (from the past 240 hour(s))  SARS CORONAVIRUS 2 (TAT 6-24 HRS) Nasopharyngeal Nasopharyngeal Swab     Status: None    Collection Time: 04/21/19  7:08 AM   Specimen: Nasopharyngeal Swab  Result Value Ref Range Status   SARS Coronavirus 2 NEGATIVE NEGATIVE Final    Comment: (NOTE) SARS-CoV-2 target nucleic acids are NOT DETECTED. The SARS-CoV-2 RNA is generally detectable in upper and lower respiratory specimens during the acute phase of infection. Negative results do not preclude SARS-CoV-2 infection, do not rule out co-infections with other pathogens, and should not be used as the sole basis for treatment or other patient management decisions. Negative results must be combined with clinical observations, patient history, and epidemiological information. The expected result is Negative. Fact Sheet for Patients: SugarRoll.be Fact Sheet for Healthcare Providers: https://www.woods-mathews.com/ This test is not yet approved or cleared by the [place] FDA and  has been authorized for detection and/or  diagnosis of SARS-CoV-2 by FDA under an Emergency Use Authorization (EUA). This EUA will remain  in effect (meaning this test can be used) for the duration of the COVID-19 declaration under Section 56 4(b)(1) of the Act, 21 U.S.C. section 360bbb-3(b)(1), unless the authorization is terminated or revoked sooner. Performed at [facility], [place] [address], [place], [place] [address]   SARS CORONAVIRUS 2 (TAT 6-24 HRS) Nasopharyngeal Nasopharyngeal Swab     Status: None   Collection Time: 04/25/19  7:59 PM   Specimen: Nasopharyngeal Swab  Result Value Ref Range Status   SARS Coronavirus 2 NEGATIVE NEGATIVE Final    Comment: (NOTE) SARS-CoV-2 target nucleic acids are NOT DETECTED. The SARS-CoV-2 RNA is generally detectable in upper and lower respiratory specimens during the acute phase of infection. Negative results do not preclude SARS-CoV-2 infection, do not rule out co-infections with other pathogens, and should not be used as the sole basis for treatment  or other patient management decisions. Negative results must be combined with clinical observations, patient history, and epidemiological information. The expected result is Negative. Fact Sheet for Patients: SugarRoll.be Fact Sheet for Healthcare Providers: https://www.woods-mathews.com/ This test is not yet approved or cleared by the [place] FDA and  has been authorized for detection and/or diagnosis of SARS-CoV-2 by FDA under an Emergency Use Authorization (EUA). This EUA will remain  in effect (meaning this test can be used) for the duration of the COVID-19 declaration under Section 56 4(b)(1) of the Act, 21 U.S.C. section 360bbb-3(b)(1), unless the authorization is terminated or revoked sooner. Performed at [facility], [place] [address] [place], [place] [address]     Radiology Reports DG Chest 2 View  Result Date: 04/20/2019 CLINICAL DATA:  Chest pain for several hours, history of lung carcinoma EXAM: CHEST - 2 VIEW COMPARISON:  01/20/2019 CT FINDINGS: Cardiac shadow is within normal limits. There is a rounded nodular density identified in the left lung measuring 3.3 cm which corresponds to that seen on the prior exam at which time it measured 1.8 cm. Patchy changes are noted in the right upper lobe also increased when compared with the prior exam. No sizable effusion is noted. No bony abnormality is noted. IMPRESSION: Enlarging bilateral upper lobe nodules as described. This is consistent with the patient's given clinical history Electronically Signed   By: Inez Catalina M.D.   On: 04/20/2019 21:56   CT Head Wo Contrast  Result Date: 04/21/2019 CLINICAL DATA:  86 year old male with pulmonary emboli, malignant appearing mediastinal lymphadenopathy and liver lesion on CT yesterday. Query brain Mets. EXAM: CT HEAD WITHOUT CONTRAST TECHNIQUE: Contiguous axial images were obtained from the base of the skull through the vertex without  intravenous contrast. COMPARISON:  No prior head imaging. FINDINGS: Brain: IV contrast was administered for chest abdomen and pelvis CT 1 hour before these images. Patchy bilateral white matter hypodensity, but no areas suspicious for vasogenic edema. No midline shift, mass effect, or evidence of intracranial mass lesion. No abnormal enhancement identified. No ventriculomegaly. No cortical encephalomalacia identified. No cortically based acute infarct identified. There is a small hypodensity in the posterior right cerebellum on series 2, image 7, probably a small chronic cerebellar infarct. Vascular: Some residual intravascular contrast is present as above. The major intracranial vascular structures seem to be enhancing as expected. Skull: No acute or suspicious osseous lesion identified. Sinuses/Orbits: Visualized paranasal sinuses and mastoids are clear. Other: No acute orbit or scalp soft tissue findings. IMPRESSION: 1. No metastatic disease  to the brain is identified by CT with very delayed contrast timing. MRI Head without and with contrast would be most sensitive. 2. No acute intracranial abnormality identified. 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 Electronically Signed   By: Genevie Ann M.D.   On: 04/21/2019 01:08   CT Angio Chest PE W and/or Wo Contrast  Result Date: 04/21/2019 CLINICAL DATA:  Non-specific chest pain and abdominal pain since 1500 hours. History of hypertension and atrial fibrillation. EXAM: CT ANGIOGRAPHY CHEST CT ABDOMEN AND PELVIS WITH CONTRAST TECHNIQUE: Multidetector CT imaging of the chest was performed using the standard protocol during bolus administration of intravenous contrast. Multiplanar CT image reconstructions and MIPs were obtained to evaluate the vascular anatomy. Multidetector CT imaging of the abdomen and pelvis was performed using the standard protocol during bolus administration of intravenous  contrast. Multidetector CT imaging of the chest was performed using the standard protocol during bolus administration of intravenous contrast. Multiplanar CT image reconstructions and MIPs were obtained to evaluate the vascular anatomy. Automatic exposure control utilized. CONTRAST:  153m OMNIPAQUE IOHEXOL 350 MG/ML SOLN COMPARISON:  January 20, 2019. FINDINGS: CTA CHEST FINDINGS Cardiovascular: Pulmonary emboli in the bilateral proximal lobar pulmonary arteries and more peripheral bilateral pulmonary arteries. No central pulmonary artery embolism. Right ventricular to left ventricular ratio 0.7; no right heart strain. Mild cardiomegaly with fatty hypertrophy of the inter atrial septum and four-vessel moderate to severe coronary calcification. Mediastinum/Nodes: Metastatic appearing right paratracheal conglomerate adenopathy measuring 3.2 by 2.4 cm. Metastatic-appearing superior mediastinal lymph node measuring 23 by 16 mm. Metastatic appearing right hilar lymph node measuring 16 by 22 mm. Additional smaller mediastinal and bilateral hilar lymph nodes that could be metastatic or benign. Small hiatal hernia. Lungs/Pleura: No pleural effusion. Bilateral spiculated upper lobe pulmonary nodules measuring 2.4 cm diameter in the left upper lobe on series 2, image 39, and 2.6 x 2.0 cm in the right upper lobe on image 26. Both nodules have increased in size is since December 2020 at which time they measured 19 x 18 mm on the left, and 8 mm diameter on the right. Respiratory motion artifact Upper Abdomen: Reported on concomitant CT abdomen and pelvis reported above. Musculoskeletal: Bone demineralization. Moderate skeletal degenerative change. A 6 mm sclerotic lesion in the T12 vertebral body stable compared to December 2020. Review of the MIP images confirms the above findings. CT ABDOMEN and PELVIS FINDINGS Hepatobiliary: A new 14 mm hypoattenuating lesion in the right hemi liver, highly worrisome for metastasis. Chronic  mild intrahepatic and 14 mm proximal common bile duct dilatation with normal distal tapering. Normal gallbladder. Pancreas: Normal. Spleen: Normal. Adrenals/Urinary Tract: Normal bilateral adrenal glands. Bilateral medical renal disease and simple appearing renal cortical cysts measuring up to 4.4 cm on the right. No hydronephrosis or nephroureterolithiasis bilaterally. No apparent urinary bladder abnormality. A dense 10 mm right renal midpole hypoattenuation. Stomach/Bowel: No bowel obstruction. Moderate stool burden and diverticulosis coli without an acute diverticulitis or apparent bowel wall thickening. Normal appendix, axial image 60. Vascular/Lymphatic: Aortic bilateral iliac in major abdominal artery calcified atherosclerosis with probably clinically significant 75% narrowing of the left common iliac artery. No abdominal aortic aneurysm. Small nonspecific retrocrural, gastrohepatic ligament and retroperitoneal lymph nodes. A mildly enlarged periportal lymph node measuring 12 x 16 mm. No abdominal aortic dissection. Reproductive: Mild prostatomegaly. Other: None. Musculoskeletal: Moderate skeletal degenerative change. A 9 mm ovoid sclerotic lesion in the right proximal femoral shaft, unchanged. Review of the  MIP images confirms the above findings. I discussed critical Value/emergent results by telephone at the time of interpretation on 04/21/2019 at 12:12 am with provider Providence Lanius , who verbally acknowledged these results. IMPRESSION: Acute bilateral lobar and peripheral pulmonary emboli without right heart strain. No thoracoabdominal aortic dissection. Suspicious bilateral upper lobe solid pulmonary nodules, both increased in size since December 2020. Histopathological correlation recommended. An embolic or infectious etiology are less likely differential considerations. Malignant-appearing mediastinal and right hilar adenopathy. Enlarged nonspecific gastrohepatic node. Additional small nonspecific lymph  nodes above and below diaphragm which could be metastatic or benign. New hepatic lesion concerning for metastatic disease. Normal bilateral adrenal glands. A new 10 mm dense right renal midpole lesion; renal ultrasonography recommended. A proteinaceous or hemorrhagic cortical cyst or malignant renal lesion are differential considerations. Mild cardiomegaly with four-vessel moderate to severe coronary calcification. Small hiatal hernia. Stable 6 mm T12 and 9 mm right proximal femoral shaft sclerotic lesions since December 2020. Thoracoabdominal aorta and major abdominal artery calcified atherosclerosis without aneurysm. Probably clinically significant 75% atherosclerotic narrowing of the right common iliac artery. Diverticulosis coli. Chronic biliary dilatation. Electronically Signed   By: Revonda Humphrey   On: 04/21/2019 00:17   CT ABDOMEN PELVIS W CONTRAST  Result Date: 04/21/2019 CLINICAL DATA:  Non-specific chest pain and abdominal pain since 1500 hours. History of hypertension and atrial fibrillation. EXAM: CT ANGIOGRAPHY CHEST CT ABDOMEN AND PELVIS WITH CONTRAST TECHNIQUE: Multidetector CT imaging of the chest was performed using the standard protocol during bolus administration of intravenous contrast. Multiplanar CT image reconstructions and MIPs were obtained to evaluate the vascular anatomy. Multidetector CT imaging of the abdomen and pelvis was performed using the standard protocol during bolus administration of intravenous contrast. Multidetector CT imaging of the chest was performed using the standard protocol during bolus administration of intravenous contrast. Multiplanar CT image reconstructions and MIPs were obtained to evaluate the vascular anatomy. Automatic exposure control utilized. CONTRAST:  161m OMNIPAQUE IOHEXOL 350 MG/ML SOLN COMPARISON:  January 20, 2019. FINDINGS: CTA CHEST FINDINGS Cardiovascular: Pulmonary emboli in the bilateral proximal lobar pulmonary arteries and more peripheral  bilateral pulmonary arteries. No central pulmonary artery embolism. Right ventricular to left ventricular ratio 0.7; no right heart strain. Mild cardiomegaly with fatty hypertrophy of the inter atrial septum and four-vessel moderate to severe coronary calcification. Mediastinum/Nodes: Metastatic appearing right paratracheal conglomerate adenopathy measuring 3.2 by 2.4 cm. Metastatic-appearing superior mediastinal lymph node measuring 23 by 16 mm. Metastatic appearing right hilar lymph node measuring 16 by 22 mm. Additional smaller mediastinal and bilateral hilar lymph nodes that could be metastatic or benign. Small hiatal hernia. Lungs/Pleura: No pleural effusion. Bilateral spiculated upper lobe pulmonary nodules measuring 2.4 cm diameter in the left upper lobe on series 2, image 39, and 2.6 x 2.0 cm in the right upper lobe on image 26. Both nodules have increased in size is since December 2020 at which time they measured 19 x 18 mm on the left, and 8 mm diameter on the right. Respiratory motion artifact Upper Abdomen: Reported on concomitant CT abdomen and pelvis reported above. Musculoskeletal: Bone demineralization. Moderate skeletal degenerative change. A 6 mm sclerotic lesion in the T12 vertebral body stable compared to December 2020. Review of the MIP images confirms the above findings. CT ABDOMEN and PELVIS FINDINGS Hepatobiliary: A new 14 mm hypoattenuating lesion in the right hemi liver, highly worrisome for metastasis. Chronic mild intrahepatic and 14 mm proximal common bile duct dilatation with normal distal tapering. Normal gallbladder. Pancreas: Normal.  Spleen: Normal. Adrenals/Urinary Tract: Normal bilateral adrenal glands. Bilateral medical renal disease and simple appearing renal cortical cysts measuring up to 4.4 cm on the right. No hydronephrosis or nephroureterolithiasis bilaterally. No apparent urinary bladder abnormality. A dense 10 mm right renal midpole hypoattenuation. Stomach/Bowel: No bowel  obstruction. Moderate stool burden and diverticulosis coli without an acute diverticulitis or apparent bowel wall thickening. Normal appendix, axial image 60. Vascular/Lymphatic: Aortic bilateral iliac in major abdominal artery calcified atherosclerosis with probably clinically significant 75% narrowing of the left common iliac artery. No abdominal aortic aneurysm. Small nonspecific retrocrural, gastrohepatic ligament and retroperitoneal lymph nodes. A mildly enlarged periportal lymph node measuring 12 x 16 mm. No abdominal aortic dissection. Reproductive: Mild prostatomegaly. Other: None. Musculoskeletal: Moderate skeletal degenerative change. A 9 mm ovoid sclerotic lesion in the right proximal femoral shaft, unchanged. Review of the MIP images confirms the above findings. I discussed critical Value/emergent results by telephone at the time of interpretation on 04/21/2019 at 12:12 am with provider Providence Lanius , who verbally acknowledged these results. IMPRESSION: Acute bilateral lobar and peripheral pulmonary emboli without right heart strain. No thoracoabdominal aortic dissection. Suspicious bilateral upper lobe solid pulmonary nodules, both increased in size since December 2020. Histopathological correlation recommended. An embolic or infectious etiology are less likely differential considerations. Malignant-appearing mediastinal and right hilar adenopathy. Enlarged nonspecific gastrohepatic node. Additional small nonspecific lymph nodes above and below diaphragm which could be metastatic or benign. New hepatic lesion concerning for metastatic disease. Normal bilateral adrenal glands. A new 10 mm dense right renal midpole lesion; renal ultrasonography recommended. A proteinaceous or hemorrhagic cortical cyst or malignant renal lesion are differential considerations. Mild cardiomegaly with four-vessel moderate to severe coronary calcification. Small hiatal hernia. Stable 6 mm T12 and 9 mm right proximal femoral  shaft sclerotic lesions since December 2020. Thoracoabdominal aorta and major abdominal artery calcified atherosclerosis without aneurysm. Probably clinically significant 75% atherosclerotic narrowing of the right common iliac artery. Diverticulosis coli. Chronic biliary dilatation. Electronically Signed   By: Revonda Humphrey   On: 04/21/2019 00:17   DG Chest Port 1 View  Result Date: 04/25/2019 CLINICAL DATA:  Chest pain and shortness of breath. EXAM: PORTABLE CHEST 1 VIEW COMPARISON:  Chest plain film and chest CT, dated April 20, 2019, are available for comparison. FINDINGS: A stable patchy area of increased opacification is again seen within the mid left lung. A stable similar-appearing area is seen overlying the upper right lung. There is no evidence of a pleural effusion or pneumothorax. The heart size and mediastinal contours are within normal limits. The visualized skeletal structures are unremarkable. IMPRESSION: Stable patchy opacities within the mid left lung and right upper lobe, consistent with the bilateral lung masses seen within these regions on the prior chest CT dated April 20, 2019. Electronically Signed   By: Virgina Norfolk M.D.   On: 04/25/2019 18:09   ECHOCARDIOGRAM COMPLETE  Result Date: 04/21/2019    ECHOCARDIOGRAM REPORT   Patient Name:   TYJUAN DEMETRO Date of Exam: 04/21/2019 Medical Rec #:  [id number]       Height:       69.0 in Accession #:    [id number]      Weight:       213.0 lb Date of Birth:  14-Nov-1933       BSA:          2.122 m Patient Age:    89 years        BP:  159/76 mmHg Patient Gender: M               HR:           98 bpm. Exam Location:  Inpatient Procedure: 2D Echo, Cardiac Doppler, Color Doppler and Intracardiac            Opacification Agent Indications:    Pulmonary embolus 415.19  History:        Patient has no prior history of Echocardiogram examinations.                 Risk Factors:Hypertension and Former Smoker. Pulmonary embolus.  Sonographer:     Vickie Epley RDCS Referring Phys: [place]  Sonographer Comments: Technically difficult study due to poor echo windows. IMPRESSIONS  1. Left ventricular ejection fraction, by estimation, is 55 to 60%. The left ventricle has normal function. The left ventricle has no regional wall motion abnormalities. Left ventricular diastolic parameters are consistent with Grade I diastolic dysfunction (impaired relaxation).  2. RV not well visualized. Appears to have low normal function. There is ventricular septal dyssynchrony - may be due to bundle branch block. Negative McConnell's sign.. Right ventricular systolic function is low normal. The right ventricular size is normal.  3. The mitral valve is grossly normal. Trivial mitral valve regurgitation.  4. The aortic valve is tricuspid. Aortic valve regurgitation is not visualized.  5. The inferior vena cava is dilated in size with >50% respiratory variability, suggesting right atrial pressure of 8 mmHg. FINDINGS  Left Ventricle: Left ventricular ejection fraction, by estimation, is 55 to 60%. The left ventricle has normal function. The left ventricle has no regional wall motion abnormalities. Definity contrast agent was given IV to delineate the left ventricular  endocardial borders. The left ventricular internal cavity size was normal in size. There is no left ventricular hypertrophy. Abnormal (paradoxical) septal motion, consistent with left bundle branch block. Left ventricular diastolic parameters are consistent with Grade I diastolic dysfunction (impaired relaxation). Indeterminate filling pressures. Right Ventricle: RV not well visualized. Appears to have low normal function. There is ventricular septal dyssynchrony - may be due to bundle branch block. Negative McConnell's sign. The right ventricular size is normal. No increase in right ventricular wall thickness. Right ventricular systolic function is low normal. Left Atrium: Left atrial size was normal in  size. Right Atrium: Right atrial size was normal in size. Pericardium: Trivial pericardial effusion is present. The pericardial effusion is localized near the right ventricle. Mitral Valve: The mitral valve is grossly normal. Trivial mitral valve regurgitation. Tricuspid Valve: The tricuspid valve is not well visualized. Tricuspid valve regurgitation is not demonstrated. Aortic Valve: The aortic valve is tricuspid. Aortic valve regurgitation is not visualized. Pulmonic Valve: The pulmonic valve was grossly normal. Pulmonic valve regurgitation is not visualized. Aorta: The aortic root, ascending aorta, aortic arch and descending aorta are all structurally normal, with no evidence of dilitation or obstruction. Venous: The inferior vena cava is dilated in size with greater than 50% respiratory variability, suggesting right atrial pressure of 8 mmHg. IAS/Shunts: No atrial level shunt detected by color flow Doppler.  LEFT VENTRICLE PLAX 2D LVIDd:         5.30 cm  Diastology LVIDs:         3.70 cm  LV e' lateral:   7.40 cm/s LV PW:         1.00 cm  LV E/e' lateral: 10.5 LV IVS:        1.00 cm  LV e' medial:    5.98 cm/s LVOT diam:     2.10 cm  LV E/e' medial:  13.0 LV SV:         67 LV SV Index:   32 LVOT Area:     3.46 cm  RIGHT VENTRICLE RV S prime:     10.60 cm/s TAPSE (M-mode): 1.9 cm LEFT ATRIUM             Index LA diam:        3.80 cm 1.79 cm/m LA Vol (A2C):   42.8 ml 20.17 ml/m LA Vol (A4C):   42.4 ml 19.98 ml/m LA Biplane Vol: 44.3 ml 20.88 ml/m  AORTIC VALVE LVOT Vmax:   107.00 cm/s LVOT Vmean:  81.100 cm/s LVOT VTI:    0.194 m  AORTA Ao Root diam: 3.50 cm MITRAL VALVE MV Area (PHT): 4.63 cm     SHUNTS MV Decel Time: 164 msec     Systemic VTI:  0.19 m MV E velocity: 77.70 cm/s   Systemic Diam: 2.10 cm MV A velocity: 108.00 cm/s MV E/A ratio:  0.72 Lyman Bishop MD Electronically signed by Lyman Bishop MD Signature Date/Time: 04/21/2019/10:53:47 AM    Final    VAS [place] LOWER EXTREMITY VENOUS (DVT)  Result  Date: 04/21/2019  Lower Venous DVTStudy Indications: Pulmonary embolism.  Comparison Study: no prior Performing Technologist: [name] RVS  Examination Guidelines: A complete evaluation includes B-mode imaging, spectral Doppler, color Doppler, and power Doppler as needed of all accessible portions of each vessel. Bilateral testing is considered an integral part of a complete examination. Limited examinations for reoccurring indications may be performed as noted. The reflux portion of the exam is performed with the patient in reverse Trendelenburg.  +---------+---------------+---------+-----------+----------+--------------+ RIGHT    CompressibilityPhasicitySpontaneityPropertiesThrombus Aging +---------+---------------+---------+-----------+----------+--------------+ CFV      Full           Yes      Yes                                 +---------+---------------+---------+-----------+----------+--------------+ SFJ      Full                                                        +---------+---------------+---------+-----------+----------+--------------+ FV Prox  Full                                                        +---------+---------------+---------+-----------+----------+--------------+ FV Mid   Full                                                        +---------+---------------+---------+-----------+----------+--------------+ FV DistalFull                                                        +---------+---------------+---------+-----------+----------+--------------+  PFV      Full                                                        +---------+---------------+---------+-----------+----------+--------------+ POP      Full           Yes      Yes                                 +---------+---------------+---------+-----------+----------+--------------+ PTV      Full                                                         +---------+---------------+---------+-----------+----------+--------------+ PERO                                                  Not visualized +---------+---------------+---------+-----------+----------+--------------+   +---------+---------------+---------+-----------+----------+--------------+ LEFT     CompressibilityPhasicitySpontaneityPropertiesThrombus Aging +---------+---------------+---------+-----------+----------+--------------+ CFV      Full           Yes      Yes                                 +---------+---------------+---------+-----------+----------+--------------+ SFJ      Full                                                        +---------+---------------+---------+-----------+----------+--------------+ FV Prox  Full                                                        +---------+---------------+---------+-----------+----------+--------------+ FV Mid   Full                                                        +---------+---------------+---------+-----------+----------+--------------+ FV DistalFull                                                        +---------+---------------+---------+-----------+----------+--------------+ PFV      Full                                                        +---------+---------------+---------+-----------+----------+--------------+  POP      Full           Yes      Yes                                 +---------+---------------+---------+-----------+----------+--------------+ PTV                                                   Not visualized +---------+---------------+---------+-----------+----------+--------------+ PERO                                                  Not visualized +---------+---------------+---------+-----------+----------+--------------+     Summary: BILATERAL: - No evidence of deep vein thrombosis seen in the lower extremities, bilaterally.   *See table(s)  above for measurements and observations. Electronically signed by Curt Jews MD on 04/21/2019 at 2:50:03 PM.    Final    US Abdomen Limited RUQ  Result Date: 04/26/2019 CLINICAL DATA:  Pancreatitis.  Hypertension.  Diabetes. EXAM: ULTRASOUND ABDOMEN LIMITED RIGHT UPPER QUADRANT COMPARISON:  04/20/2019 abdominopelvic CT FINDINGS: Gallbladder: No gallstones or wall thickening visualized. No sonographic Murphy sign noted by sonographer. Common bile duct: Diameter: Dilated, including at 1.3 cm. compared to the recent CT, common duct stone or stones are present distally. Liver: Central right hepatic lobe hypoechoic lesion of 2.0 cm, as on CT. Portal vein is patent on color Doppler imaging with normal direction of blood flow towards the liver. Other: No ascites. IMPRESSION: 1. Normal gallbladder. 2. Biliary duct dilatation with choledocholithiasis. Consider further evaluation with ERCP. 3. Right hepatic lobe lesion as on CT. These results will be called to the ordering clinician or representative by the Radiologist Assistant, and communication documented in the PACS or [employer]. Electronically Signed   By: Abigail Miyamoto M.D.   On: 04/26/2019 07:16    Lab Data:  CBC: Recent Labs  Lab 04/20/19 2247 04/21/19 0510 04/22/19 0734 04/25/19 1721 04/26/19 0532  WBC 14.9* 18.1* 14.9* 15.4* 26.5*  HGB 15.3 14.5 12.9* 14.2 12.2*  HCT 46.3 42.4 38.1* 43.0 37.3*  MCV 93.3 92.0 90.9 92.1 94.2  PLT 287 313 238 302 322   Basic Metabolic Panel: Recent Labs  Lab 04/20/19 2247 04/21/19 0707 04/22/19 0734 04/25/19 1721 04/26/19 0532  NA 131* 129* 127* 130* 128*  K 4.4 4.5 4.3 3.8 4.9  CL 95* 94* 96* 96* 98  CO2 22 21* 20* 22 20*  GLUCOSE 357* 433* 206* 184* 232*  BUN 20 17 32* 15 23  CREATININE 1.34* 1.37* 1.46* 1.10 1.55*  CALCIUM 9.3 9.1 8.5* 8.7* 8.0*   GFR: Estimated Creatinine Clearance: 41.3 mL/min (A) (by C-G formula based on SCr of 1.55 mg/dL (H)). Liver Function Tests: Recent Labs    Lab 04/20/19 2247 04/21/19 0707 04/25/19 1721 04/26/19 0532  AST 216* 326* 287* 506*  ALT 155* 223* 200* 298*  ALKPHOS 468* 472* 569* 551*  BILITOT 4.3* 6.8* 4.3* 6.4*  PROT 7.6 7.4 6.7 6.0*  ALBUMIN 3.7 3.4* 2.6* 2.3*   Recent Labs  Lab 04/20/19 2247 04/25/19 1721 04/26/19 0532  LIPASE 39 2,207* 1,879*   No results for input(s): AMMONIA in the last  168 hours. Coagulation Profile: Recent Labs  Lab 04/26/19 0532  INR 1.2   Cardiac Enzymes: No results for input(s): CKTOTAL, CKMB, CKMBINDEX, TROPONINI in the last 168 hours. BNP (last 3 results) No results for input(s): PROBNP in the last 8760 hours. HbA1C: No results for input(s): HGBA1C in the last 72 hours. CBG: Recent Labs  Lab 04/22/19 0754 04/22/19 0916 04/26/19 0028 04/26/19 0546 04/26/19 0729  GLUCAP 204* 190* 219* 197* 176*   Lipid Profile: No results for input(s): CHOL, HDL, LDLCALC, TRIG, CHOLHDL, LDLDIRECT in the last 72 hours. Thyroid Function Tests: No results for input(s): TSH, T4TOTAL, FREET4, T3FREE, THYROIDAB in the last 72 hours. Anemia Panel: No results for input(s): VITAMINB12, FOLATE, FERRITIN, TIBC, IRON, RETICCTPCT in the last 72 hours. Urine analysis:    Component Value Date/Time   COLORURINE AMBER (A) 04/25/2019 1721   APPEARANCEUR CLEAR 04/25/2019 1721   LABSPEC 1.006 04/25/2019 1721   PHURINE 5.0 04/25/2019 1721   GLUCOSEU NEGATIVE 04/25/2019 1721   HGBUR SMALL (A) 04/25/2019 1721   BILIRUBINUR NEGATIVE 04/25/2019 1721   KETONESUR NEGATIVE 04/25/2019 1721   PROTEINUR NEGATIVE 04/25/2019 1721   NITRITE NEGATIVE 04/25/2019 1721   LEUKOCYTESUR NEGATIVE 04/25/2019 1721     Dedrick Heffner M.D. [facility] 04/26/2019, 11:32 AM   Call night coverage person covering after 7pm

## 2019-04-26 NOTE — H&P (View-Only) (Signed)
Consultation  Referring Provider: [facility]/ Rai Primary Care Physician:  [place] Primary Gastroenterologist:  none  Reason for Consultation: Acute pancreatitis, CBD stone  HPI: Dennis Stephens is a 86 y.o. male with complicated recent history, who was admitted last night through the emergency room, after he presented with acute abdominal pain.  Patient has history of hypertension, atrial fibrillation, and was just discharged from the hospital on 04/22/2019 with new diagnosis of bilateral PEs and probable underlying metastatic cancer with spiculated pulmonary nodules noted on imaging.  He was discharged on Xarelto, and at that time was opting for follow-up through the [place]. He says he developed acute upper abdominal pain mainly in the right upper quadrant over this past weekend and rates it as a 10 out of 10.  He is also had some shortness of breath.  This was associated with nausea and vomiting, no documented fever or chills.  He actually went to the emergency room at the [place] in [place] on Saturday and was discharged home. Work-up in the ER here with chest x-ray showing stable patchy opacities within the mid left lung and right upper lobe insistent with the bilateral lung masses seen on recent CT.  lipase was 2200, LFTs with T bili 4.3/alk phos 569/ALT 200/AST 287 Albumin 2.6 WBC 15.4, hemoglobin 14.2 BNP 128 Troponin within normal limits COVID-19 negative  Today pro time 15.4 INR 1.2, lipase 1879, up to 26.5, hemoglobin 12.2  Upper abdominal ultrasound today CBD 1.3 cm, with stone or stones present distally, normal gallbladder, right hepatic lobe lesion as on CT.  He has been started on piperacillin/tazobactam  CT of the chest, abdomen and pelvis last week on 04/21/2019-showed metastatic appearing right paratracheal conglomerate adenopathy, metastatic appearing superior mediastinal lymph node and right hilar lymph node and additional smaller mediastinal and bilateral hilar lymph nodes.   Bilateral spiculated upper lobe pulmonary nodules measuring 2.4 cm, left upper lobe and 2.6 x 2 cm in the right upper lobe, both increased in size since December 2020.  There is a new 14 mm hypoattenuating lesion in the right liver worrisome for metastases and chronic mild intrahepatic and 14 mm proximal CBD dilation    Past Medical History:  Diagnosis Date  . A-fib ([place])   . DM2 (diabetes mellitus, type 2) ([place])   . Hypertension     Past Surgical History:  Procedure Laterality Date  . LIPOMA EXCISION      Prior to Admission medications   Medication Sig Start Date End Date Taking? Authorizing Provider  amLODipine (NORVASC) 2.5 MG tablet Take 2.5 mg by mouth daily.   Yes [provider]  atorvastatin (LIPITOR) 40 MG tablet Take 40 mg by mouth daily.   Yes [provider]  carvedilol (COREG) 12.5 MG tablet Take 12.5 mg by mouth 2 (two) times daily with a meal.   Yes [provider]  doxazosin (CARDURA) 8 MG tablet Take 8 mg by mouth daily.   Yes [provider]  glimepiride (AMARYL) 2 MG tablet Take 1 tablet (2 mg total) by mouth daily with breakfast. 04/22/19 04/21/20 Yes Rai, Ripudeep K, MD  loratadine (CLARITIN) 10 MG tablet Take 1 tablet (10 mg total) by mouth daily as needed for allergies or rhinitis. 04/22/19  Yes Rai, Ripudeep K, MD  losartan (COZAAR) 100 MG tablet Take 100 mg by mouth daily.   Yes [provider]  omeprazole (PRILOSEC) 20 MG capsule Take 20 mg by mouth daily.   Yes [provider]  primidone (MYSOLINE) 50 MG tablet Take 100 mg by mouth every morning.   Yes [provider]  Rivaroxaban 15 & 20 MG TBPK Follow package directions: Take one 57m tablet by mouth twice a day. On day 22 (05/13/19), switch to one 248mtablet once a day. Take with food. 04/22/19  Yes Rai, Ripudeep K, MD  sertraline (ZOLOFT) 100 MG tablet Take 100 mg by mouth daily.   Yes [provider]  temazepam (RESTORIL) 30 MG capsule  Take 30 mg by mouth at bedtime as needed for sleep.   Yes [provider]  rivaroxaban (XARELTO) 20 MG TABS tablet Take 1 tablet (20 mg total) by mouth daily with supper. Start once the starter pack has been completed 05/13/19   Rai, RiVernelle EmeraldMD    Current Facility-Administered Medications  Medication Dose Route Frequency Provider Last Rate Last Admin  . 0.9 %  sodium chloride infusion   Intravenous Continuous Rai, Ripudeep K, MD 125 mL/hr at 04/26/19 0841 New Bag at 04/26/19 0841  . carvedilol (COREG) tablet 12.5 mg  12.5 mg Oral BID WC MuGilles Chiquito, MD   12.5 mg at 04/26/19 0815  . docusate sodium (COLACE) capsule 100 mg  100 mg Oral BID MuGilles Chiquito, MD   100 mg at 04/26/19 1231  . guaiFENesin (MUCINEX) 12 hr tablet 600 mg  600 mg Oral BID Rai, Ripudeep K, MD   600 mg at 04/26/19 1232  . insulin aspart (novoLOG) injection 0-15 Units  0-15 Units Subcutaneous Q4H MuSid FalconMD   3 Units at 04/26/19 [phone]. loratadine (CLARITIN) tablet 10 mg  10 mg Oral Daily PRN MuGilles Chiquito, MD      . morphine 2 MG/ML injection 2-4 mg  2-4 mg Intravenous Q4H PRN Rai, Ripudeep K, MD      . ondansetron (ZOFRAN) injection 4 mg  4 mg Intravenous Q6H PRN MuSid FalconMD      . pantoprazole (PROTONIX) EC tablet 40 mg  40 mg Oral Daily MuGilles Chiquito, MD   40 mg at 04/26/19 1232  . piperacillin-tazobactam (ZOSYN) IVPB 3.375 g  3.375 g Intravenous Q8H SwLenis NoonRPH      . primidone (MYSOLINE) tablet 100 mg  100 mg Oral Daily MuGilles Chiquito, MD   100 mg at 04/26/19 1232  . senna (SENOKOT) tablet 8.6 mg  1 tablet Oral BID MuGilles Chiquito, MD      . sertraline (ZOLOFT) tablet 100 mg  100 mg Oral Daily MuGilles Chiquito, MD   100 mg at 04/26/19 1231  . sodium chloride flush (NS) 0.9 % injection 3 mL  3 mL Intravenous Once MuGilles Chiquito, MD      . temazepam (RESTORIL) capsule 30 mg  30 mg Oral QHS PRN MuSid FalconMD        Allergies as of 04/25/2019  . (No Known Allergies)     Family History  Problem Relation Age of Onset  . Hypertension Mother   . Hypertension Father   . Diabetes Mellitus II Neg Hx     Social History   Socioeconomic History  . Marital status: Married    Spouse name: Not on file  . Number of children: Not on file  . Years of education: Not on file  . Highest education level: Not on file  Occupational History  . Not on file  Tobacco Use  . Smoking status: Former SmResearch scientist (life sciences). Smokeless  tobacco: Never Used  Substance and Sexual Activity  . Alcohol use: Not Currently  . Drug use: Not Currently  . Sexual activity: Not Currently  Other Topics Concern  . Not on file  Social History Narrative  . Not on file   Social Determinants of Health   Financial Resource Strain:   . Difficulty of Paying Living Expenses:   Food Insecurity:   . Worried About Charity fundraiser in the Last Year:   . Arboriculturist in the Last Year:   Transportation Needs:   . Film/video editor (Medical):   Marland Kitchen Lack of Transportation (Non-Medical):   Physical Activity:   . Days of Exercise per Week:   . Minutes of Exercise per Session:   Stress:   . Feeling of Stress :   Social Connections:   . Frequency of Communication with Friends and Family:   . Frequency of Social Gatherings with Friends and Family:   . Attends Religious Services:   . Active Member of Clubs or Organizations:   . Attends Archivist Meetings:   Marland Kitchen Marital Status:   Intimate Partner Violence:   . Fear of Current or Ex-Partner:   . Emotionally Abused:   Marland Kitchen Physically Abused:   . Sexually Abused:     Review of Systems: Pertinent positive and negative review of systems were noted in the above HPI section.  All other review of systems was otherwise negative.  Physical Exam: Vital signs in last 24 hours: Temp:  [97.8 F (36.6 C)-98.6 F (37 C)] 97.8 F (36.6 C) (03/15 0544) Pulse Rate:  [77-97] 77 (03/15 0544) Resp:  [14-23] 17 (03/15 0544) BP: (96-144)/(39-61) 126/61  (03/15 0544) SpO2:  [92 %-97 %] 97 % (03/15 0544) Weight:  [96 kg] 96 kg (03/14 1718)   General:   Alert,  Well-developed, ill-appearing elderly white male, pleasant and cooperative in NAD, jaundiced Head:  Normocephalic and atraumatic. Eyes:  Sclera icteric.   Conjunctiva pink. Ears:  Normal auditory acuity. Nose:  No deformity, discharge,  or lesions. Mouth:  No deformity or lesions.   Neck:  Supple; no masses or thyromegaly. Lungs scattered rhonchi  . Heart:  Regular rate and rhythm; no murmurs, clicks, rubs,  or gallops. Abdomen: Soft, few bowel sounds present, is diffusely tender across the upper abdomen, no rebound, no palpable mass or hepatosplenomegaly Rectal:  Deferred  Msk:  Symmetrical without gross deformities. . Pulses:  Normal pulses noted. Extremities:  Without clubbing or edema. Neurologic:  Alert and  oriented x4;  grossly normal neurologically. Skin:  Intact without significant lesions or rashes.. Psych:  Alert and cooperative. Normal mood and affect.  Intake/Output from previous day: 03/14 0701 - 03/15 0700 In: 1000 [IV Piggyback:1000] Out: -  Intake/Output this shift: Total I/O In: 0  Out: 175 [Urine:175]  Lab Results: Recent Labs    04/25/19 1721 04/26/19 0532  WBC 15.4* 26.5*  HGB 14.2 12.2*  HCT 43.0 37.3*  PLT 302 288   BMET Recent Labs    04/25/19 1721 04/26/19 0532  NA 130* 128*  K 3.8 4.9  CL 96* 98  CO2 22 20*  GLUCOSE 184* 232*  BUN 15 23  CREATININE 1.10 1.55*  CALCIUM 8.7* 8.0*   LFT Recent Labs    04/26/19 0532  PROT 6.0*  ALBUMIN 2.3*  AST 506*  ALT 298*  ALKPHOS 551*  BILITOT 6.4*   PT/INR Recent Labs    04/26/19 0532  LABPROT 15.4*  INR  1.2     IMPRESSION:   #2 86 year old white male with acute pancreatitis, etiology not entirely certain though ultrasound today suggestive of distal CBD stone or stones. Patient has new diagnosis of probable metastatic cancer with right hepatic lobe lesion and enlarged  gastrohepatic node cannot rule out biliary obstruction secondary to adenopathy  #2 bilateral spiculated lung masses with extensive spinal and right hilar adenopathy  #3 new bilateral pulmonary emboli-diagnosed 1 week ago and on Xarelto #4 anticoagulation-last dose Xarelto today #5 history of atrial fibrillation  Plan; vigorous hydration in setting of acute pancreatitis Agree with IV Zosyn Hold Xarelto Patient will need heparin bridge, have discussed with Dr. Tana Coast.  Will need to hold heparin for 6 hours prior to planned ERCP  Stat MRI/MRCP today  Patient is tentatively scheduled for ERCP with Dr. Henrene Pastor tomorrow morning.  Procedure was discussed in detail with the patient including indications risks and benefits and he is agreeable to proceed.  We discussed risk of bleeding, perforation, and worsening of pancreatitis.  Patient also needs bronchoscopy for diagnosis.  He is now agreeable to have bronchoscopy here while he is hospitalized and in order to minimize sedation, Dr. Hilarie Fredrickson has contacted pulmonary and plan is to coordinate bronchoscopy to be done tomorrow morning at [facility] post ERCP.  Thank you will follow with you     Amy Esterwood PA-C 04/26/2019, 1:40 PM

## 2019-04-26 NOTE — Progress Notes (Signed)
ANTICOAGULATION CONSULT NOTE - Initial Consult  Pharmacy Consult for heparin Indication: Recent PE, Xarelto on hold  No Known Allergies  Patient Measurements: Height: 5\' 9"  (175.3 cm) Weight: 211 lb 10.3 oz (96 kg) IBW/kg (Calculated) : 70.7 Heparin Dosing Weight: 91 kg  Vital Signs: Temp: 97.8 F (36.6 C) (03/15 0544) Temp Source: Oral (03/15 0544) BP: 126/61 (03/15 0544) Pulse Rate: 77 (03/15 0544)  Labs: Recent Labs    04/25/19 1721 04/25/19 1730 04/25/19 1929 04/26/19 0532  HGB 14.2  --   --  12.2*  HCT 43.0  --   --  37.3*  PLT 302  --   --  288  LABPROT  --   --   --  15.4*  INR  --   --   --  1.2  CREATININE 1.10  --   --  1.55*  TROPONINIHS  --  8 8  --     Estimated Creatinine Clearance: 41.3 mL/min (A) (by C-G formula based on SCr of 1.55 mg/dL (H)).   Medical History: Past Medical History:  Diagnosis Date  . A-fib ([place])   . DM2 (diabetes mellitus, type 2) ([place])   . Hypertension    Assessment: Pt is a 69 yoM with PMH significant for afib, recently diagnosed PE, recently diagnosed likely metastatic cancer with pulmonary nodules.   Pt was recently admitted from 04/20/19 - 04/22/19 at which time he was diagnosed with bilateral PE and prescribed rivaroxaban. Rivaroxaban 15 mg PO BID initiated on 3/11, last dose was given at 0815 this morning (04/26/19). Anticoagulation being converted to heparin drip at this time for anticipated procedures.   Today, 04/26/19  CBC: Hgb 12.2 (decreased but consistent with labs on 3/11), Plt WNL  SCr 1.55, CrCl 41 mL/min  Last dose of rivaroxaban given 3/15 @ 0815. Baseline APTT and HL ordered. Expect baseline HL to be falsely elevated due to DOAC  Goal of Therapy:  APTT 66-102 seconds Heparin level 0.3-0.7 units/ml Monitor platelets by anticoagulation protocol: Yes   Plan:   Discontinue rivaroxaban. Start heparin at 2000 tonight (12 hours after last rivaroxaban dose).  Initiate heparin drip at rate of 1250 units/hr  as pt was therapeutic on this rate during recent admission  If baseline HL elevated, will need to dose/monitor using aPTT until HL and aPTT correlate  CBC, aPTT/HL daily while on heparin  Monitor for signs and symptoms of bleeding  Heparin infusion to be turned off at 0330 on 04/27/19 for ERCP/EBUS and bronchoscopy.   Please advise on when safe to resume heparin post-procedures tomorrow.   Lenis Noon, PharmD 04/26/2019,1:39 PM

## 2019-04-26 NOTE — Progress Notes (Signed)
NAME:  Dennis Stephens, MRN:  [id number], DOB:  [birth year], LOS: 1 ADMISSION DATE:  04/25/2019, CONSULTATION DATE: 04/26/2019 REFERRING MD: Dr. Tana Coast, CHIEF COMPLAINT: Lung mass  Brief History   86 year old gentleman, newly found lung mass on last hospital admission.  Patient was discharged with follow-up with the [facility] however came back with gallstone pancreatitis.  Currently here with plans for ERCP asked to see patient again to discuss potential biopsy.  History of present illness   86 year old gentleman past medical history of tobacco abuse, newly diagnosed bilateral PE, currently on Xarelto.  Recently admitted last week with plans for navigational bronchoscopy and EBUS bronchoscopy with TB NA of the mediastinum for last Friday.  However patient made decision for requesting discharge home and follow-up at the [place].  Patient return to the hospital yesterday with abdominal pain found to have gallstone pancreatitis.  GI has seen the patient with recommendations for ERCP.  Unfortunately patient is currently taking Xarelto.  He did receive Xarelto dosing this morning.  Patient is a former smoker.  Quit 35 years ago.  First wife passed from lung cancer.  She was also a smoker.  Imaging of the abdomen revealed a 3.5 cm CBD with stone.  Past Medical History   Past Medical History:  Diagnosis Date  . A-fib ([place])   . DM2 (diabetes mellitus, type 2) ([place])   . Hypertension      Significant Hospital Events   04/27/2019: ERCP  Consults:  Gastroenterology  Procedures:  04/27/2019: ERCP pending 04/28/2019: Bronchoscopy at [facility], pending  Significant Diagnostic Tests:  CT scan of the chest with left upper lobe lung mass and associated mediastinal adenopathy.  Micro Data:    Antimicrobials:  Piperacillin tazobactam  Interim history/subjective:  Patient states he is feeling okay.  Does have some belly pain.  Feels better now than it did yesterday.  He states that he came back to the hospital  because his wife called 911.  Objective   Blood pressure (!) 118/53, pulse 66, temperature 97.9 F (36.6 C), temperature source Oral, resp. rate 16, height 5\' 9"  (1.753 m), weight 96 kg, SpO2 96 %.        Intake/Output Summary (Last 24 hours) at 04/26/2019 1625 Last data filed at 04/26/2019 2585 Gross per 24 hour  Intake 1000 ml  Output 175 ml  Net 825 ml   Filed Weights   04/25/19 1718  Weight: 96 kg    Examination: General: Elderly man, resting in bed no distress, slightly jaundiced HENT: NCAT sclera clear tracking appropriately Lungs: Clear to auscultation bilaterally no crackles no wheeze Cardiovascular: Regular rate rhythm S1-S2 Abdomen: Soft mildly distended, some tenderness in the right upper quadrant Extremities: No significant edema, tattoos Neuro: Alert oriented following commands no focal deficit GU: Deferred  Resolved Hospital Problem list     Assessment & Plan:   Bilateral upper lobe solitary pulmonary nodules, enlarged 4R lymphnode - concern for an advanced stage bronchogenic carcinoma  Larger lesion within the left upper lobe concerning for primary lung cancer Complicated by acute bilateral subsegmental PE.,  No evidence of right ventricular strain. -Continue patient on anticoagulation, transition from Xarelto to heparin drip. -We will arrange for transfer to [facility] preop area for case to be completed at [facility] for navigational bronchoscopy and mediastinal tissue sampling. -Discussed with patient and he is agreeable to have this done on this hospital admission. -This however should be completed once he has had the ERCP. -We will arrange transfer. -We  will have 1 mm cut super D CT format from March 9 CT chest  Obstructed common bile duct, gallstone pancreatitis -Continue antimicrobials per primary -ERCP planned for tomorrow here at [facility].  Best practice:  Diet: N.p.o. midnight 04/27/2019 DVT prophylaxis: Heparin drip Code Status:  DNR  Labs   CBC: Recent Labs  Lab 04/20/19 2247 04/21/19 0510 04/22/19 0734 04/25/19 1721 04/26/19 0532  WBC 14.9* 18.1* 14.9* 15.4* 26.5*  HGB 15.3 14.5 12.9* 14.2 12.2*  HCT 46.3 42.4 38.1* 43.0 37.3*  MCV 93.3 92.0 90.9 92.1 94.2  PLT 287 313 238 302 287    Basic Metabolic Panel: Recent Labs  Lab 04/20/19 2247 04/21/19 0707 04/22/19 0734 04/25/19 1721 04/26/19 0532  NA 131* 129* 127* 130* 128*  K 4.4 4.5 4.3 3.8 4.9  CL 95* 94* 96* 96* 98  CO2 22 21* 20* 22 20*  GLUCOSE 357* 433* 206* 184* 232*  BUN 20 17 32* 15 23  CREATININE 1.34* 1.37* 1.46* 1.10 1.55*  CALCIUM 9.3 9.1 8.5* 8.7* 8.0*   GFR: Estimated Creatinine Clearance: 41.3 mL/min (A) (by C-G formula based on SCr of 1.55 mg/dL (H)). Recent Labs  Lab 04/21/19 0510 04/22/19 0734 04/25/19 1721 04/26/19 0532  WBC 18.1* 14.9* 15.4* 26.5*    Liver Function Tests: Recent Labs  Lab 04/20/19 2247 04/21/19 0707 04/25/19 1721 04/26/19 0532  AST 216* 326* 287* 506*  ALT 155* 223* 200* 298*  ALKPHOS 468* 472* 569* 551*  BILITOT 4.3* 6.8* 4.3* 6.4*  PROT 7.6 7.4 6.7 6.0*  ALBUMIN 3.7 3.4* 2.6* 2.3*   Recent Labs  Lab 04/20/19 2247 04/25/19 1721 04/26/19 0532  LIPASE 39 2,207* 1,879*   No results for input(s): AMMONIA in the last 168 hours.  ABG    Component Value Date/Time   TCO2 25 01/20/2019 1823     Coagulation Profile: Recent Labs  Lab 04/26/19 0532  INR 1.2    Cardiac Enzymes: No results for input(s): CKTOTAL, CKMB, CKMBINDEX, TROPONINI in the last 168 hours.  HbA1C: Hgb A1c MFr Bld  Date/Time Value Ref Range Status  04/21/2019 05:34 AM 8.7 (H) 4.8 - 5.6 % Final    Comment:    (NOTE) Pre diabetes:          5.7%-6.4% Diabetes:              >6.4% Glycemic control for   <7.0% adults with diabetes     CBG: Recent Labs  Lab 04/26/19 0028 04/26/19 0546 04/26/19 0729 04/26/19 1223 04/26/19 1604  GLUCAP 219* 197* 176* 94 75    Review of Systems:   Review of Systems    Constitutional: Negative for chills, fever, malaise/fatigue and weight loss.  HENT: Negative for hearing loss, sore throat and tinnitus.   Eyes: Negative for blurred vision and double vision.  Respiratory: Positive for cough and shortness of breath. Negative for hemoptysis, sputum production, wheezing and stridor.   Cardiovascular: Negative for chest pain, palpitations, orthopnea, leg swelling and PND.  Gastrointestinal: Positive for abdominal pain, nausea and vomiting. Negative for constipation, diarrhea and heartburn.  Genitourinary: Negative for dysuria, hematuria and urgency.  Musculoskeletal: Negative for joint pain and myalgias.  Skin: Negative for itching and rash.  Neurological: Negative for dizziness, tingling, weakness and headaches.  Endo/Heme/Allergies: Negative for environmental allergies. Does not bruise/bleed easily.  Psychiatric/Behavioral: Negative for depression. The patient is not nervous/anxious and does not have insomnia.   All other systems reviewed and are negative.    Past Medical History  He,  has a past medical history of A-fib ([place]), DM2 (diabetes mellitus, type 2) ([place]), and Hypertension.   Surgical History    Past Surgical History:  Procedure Laterality Date  . LIPOMA EXCISION       Social History   reports that he has quit smoking. He has never used smokeless tobacco. He reports previous alcohol use. He reports previous drug use.   Family History   His family history includes Hypertension in his father and mother. There is no history of Diabetes Mellitus II.   Allergies No Known Allergies   Home Medications  Prior to Admission medications   Medication Sig Start Date End Date Taking? Authorizing Provider  amLODipine (NORVASC) 2.5 MG tablet Take 2.5 mg by mouth daily.   Yes [provider]  atorvastatin (LIPITOR) 40 MG tablet Take 40 mg by mouth daily.   Yes [provider]  carvedilol (COREG) 12.5 MG tablet Take 12.5 mg by mouth  2 (two) times daily with a meal.   Yes [provider]  doxazosin (CARDURA) 8 MG tablet Take 8 mg by mouth daily.   Yes [provider]  glimepiride (AMARYL) 2 MG tablet Take 1 tablet (2 mg total) by mouth daily with breakfast. 04/22/19 04/21/20 Yes Rai, Ripudeep K, MD  loratadine (CLARITIN) 10 MG tablet Take 1 tablet (10 mg total) by mouth daily as needed for allergies or rhinitis. 04/22/19  Yes Rai, Ripudeep K, MD  losartan (COZAAR) 100 MG tablet Take 100 mg by mouth daily.   Yes [provider]  omeprazole (PRILOSEC) 20 MG capsule Take 20 mg by mouth daily.   Yes [provider]  primidone (MYSOLINE) 50 MG tablet Take 100 mg by mouth every morning.   Yes [provider]  Rivaroxaban 15 & 20 MG TBPK Follow package directions: Take one 15mg  tablet by mouth twice a day. On day 22 (05/13/19), switch to one 20mg  tablet once a day. Take with food. 04/22/19  Yes Rai, Ripudeep K, MD  sertraline (ZOLOFT) 100 MG tablet Take 100 mg by mouth daily.   Yes [provider]  temazepam (RESTORIL) 30 MG capsule Take 30 mg by mouth at bedtime as needed for sleep.   Yes [provider]  rivaroxaban (XARELTO) 20 MG TABS tablet Take 1 tablet (20 mg total) by mouth daily with supper. Start once the starter pack has been completed 05/13/19   Mendel Corning, MD    Garner Nash, DO [facility] 04/26/2019 4:39 PM

## 2019-04-26 NOTE — Progress Notes (Signed)
Pharmacy Antibiotic Note  Dennis Stephens is a 86 y.o. male admitted on 04/25/2019 with IAI.  Pharmacy has been consulted for piperacillin/tazobactam dosing.  Pt admitted with acute pancreatitis 2/2 choledocholithiasis, leukocytosis worsening. GI consulted for possible ERCP, ABX being initiated for IAI.  Today, 04/26/19 -WBC 26.5, increasing -SCr 1.55, CrCL ~41 mL/min -Afebrile  Plan:  Piperacillin/tazobactam 3.375 g IV once over 30 minutes followed by piperacillin/tazobactam 3.375g IV q8h EI  Follow renal function for any necessary dose adjustments.  Height: 5\' 9"  (175.3 cm) Weight: 211 lb 10.3 oz (96 kg) IBW/kg (Calculated) : 70.7  Temp (24hrs), Avg:98.1 F (36.7 C), Min:97.8 F (36.6 C), Max:98.6 F (37 C)  Recent Labs  Lab 04/20/19 2247 04/21/19 0510 04/21/19 0707 04/22/19 0734 04/25/19 1721 04/26/19 0532  WBC 14.9* 18.1*  --  14.9* 15.4* 26.5*  CREATININE 1.34*  --  1.37* 1.46* 1.10 1.55*    Estimated Creatinine Clearance: 41.3 mL/min (A) (by C-G formula based on SCr of 1.55 mg/dL (H)).    No Known Allergies  Antimicrobials this admission: Piperacillin/tazobactam 3/15 >>   Dose adjustments this admission:  Microbiology results: 3/15 BCx:   Lenis Noon, PharmD 04/26/2019 11:59 AM

## 2019-04-26 NOTE — Consult Note (Signed)
Consultation  Referring Provider: [facility]/ Rai Primary Care Physician:  [facility] Primary Gastroenterologist:  none  Reason for Consultation: Acute pancreatitis, CBD stone  HPI: Dennis Stephens is a 86 y.o. male with complicated recent history, who was admitted last night through the emergency room, after he presented with acute abdominal pain.  Patient has history of hypertension, atrial fibrillation, and was just discharged from the hospital on 04/22/2019 with new diagnosis of bilateral PEs and probable underlying metastatic cancer with spiculated pulmonary nodules noted on imaging.  He was discharged on Xarelto, and at that time was opting for follow-up through the [place]. He says he developed acute upper abdominal pain mainly in the right upper quadrant over this past weekend and rates it as a 10 out of 10.  He is also had some shortness of breath.  This was associated with nausea and vomiting, no documented fever or chills.  He actually went to the emergency room at the [place] in [place] on Saturday and was discharged home. Work-up in the ER here with chest x-ray showing stable patchy opacities within the mid left lung and right upper lobe insistent with the bilateral lung masses seen on recent CT.  lipase was 2200, LFTs with T bili 4.3/alk phos 569/ALT 200/AST 287 Albumin 2.6 WBC 15.4, hemoglobin 14.2 BNP 128 Troponin within normal limits COVID-19 negative  Today pro time 15.4 INR 1.2, lipase 1879, up to 26.5, hemoglobin 12.2  Upper abdominal ultrasound today CBD 1.3 cm, with stone or stones present distally, normal gallbladder, right hepatic lobe lesion as on CT.  He has been started on piperacillin/tazobactam  CT of the chest, abdomen and pelvis last week on 04/21/2019-showed metastatic appearing right paratracheal conglomerate adenopathy, metastatic appearing superior mediastinal lymph node and right hilar lymph node and additional smaller mediastinal and bilateral hilar lymph nodes.   Bilateral spiculated upper lobe pulmonary nodules measuring 2.4 cm, left upper lobe and 2.6 x 2 cm in the right upper lobe, both increased in size since December 2020.  There is a new 14 mm hypoattenuating lesion in the right liver worrisome for metastases and chronic mild intrahepatic and 14 mm proximal CBD dilation    Past Medical History:  Diagnosis Date  . A-fib ([place])   . DM2 (diabetes mellitus, type 2) ([place])   . Hypertension     Past Surgical History:  Procedure Laterality Date  . LIPOMA EXCISION      Prior to Admission medications   Medication Sig Start Date End Date Taking? Authorizing Provider  amLODipine (NORVASC) 2.5 MG tablet Take 2.5 mg by mouth daily.   Yes [provider]  atorvastatin (LIPITOR) 40 MG tablet Take 40 mg by mouth daily.   Yes [provider]  carvedilol (COREG) 12.5 MG tablet Take 12.5 mg by mouth 2 (two) times daily with a meal.   Yes [provider]  doxazosin (CARDURA) 8 MG tablet Take 8 mg by mouth daily.   Yes [provider]  glimepiride (AMARYL) 2 MG tablet Take 1 tablet (2 mg total) by mouth daily with breakfast. 04/22/19 04/21/20 Yes Rai, Ripudeep K, MD  loratadine (CLARITIN) 10 MG tablet Take 1 tablet (10 mg total) by mouth daily as needed for allergies or rhinitis. 04/22/19  Yes Rai, Ripudeep K, MD  losartan (COZAAR) 100 MG tablet Take 100 mg by mouth daily.   Yes [provider]  omeprazole (PRILOSEC) 20 MG capsule Take 20 mg by mouth daily.   Yes [provider]  primidone (MYSOLINE) 50 MG tablet Take 100 mg by mouth every morning.   Yes [provider]  Rivaroxaban 15 & 20 MG TBPK Follow package directions: Take one 40m tablet by mouth twice a day. On day 22 (05/13/19), switch to one 258mtablet once a day. Take with food. 04/22/19  Yes Rai, Ripudeep K, MD  sertraline (ZOLOFT) 100 MG tablet Take 100 mg by mouth daily.   Yes [provider]  temazepam (RESTORIL) 30 MG capsule  Take 30 mg by mouth at bedtime as needed for sleep.   Yes [provider]  rivaroxaban (XARELTO) 20 MG TABS tablet Take 1 tablet (20 mg total) by mouth daily with supper. Start once the starter pack has been completed 05/13/19   Rai, RiVernelle EmeraldMD    Current Facility-Administered Medications  Medication Dose Route Frequency Provider Last Rate Last Admin  . 0.9 %  sodium chloride infusion   Intravenous Continuous Rai, Ripudeep K, MD 125 mL/hr at 04/26/19 0841 New Bag at 04/26/19 0841  . carvedilol (COREG) tablet 12.5 mg  12.5 mg Oral BID WC MuGilles Chiquito, MD   12.5 mg at 04/26/19 0815  . docusate sodium (COLACE) capsule 100 mg  100 mg Oral BID MuGilles Chiquito, MD   100 mg at 04/26/19 1231  . guaiFENesin (MUCINEX) 12 hr tablet 600 mg  600 mg Oral BID Rai, Ripudeep K, MD   600 mg at 04/26/19 1232  . insulin aspart (novoLOG) injection 0-15 Units  0-15 Units Subcutaneous Q4H MuSid FalconMD   3 Units at 04/26/19 [phone]. loratadine (CLARITIN) tablet 10 mg  10 mg Oral Daily PRN MuGilles Chiquito, MD      . morphine 2 MG/ML injection 2-4 mg  2-4 mg Intravenous Q4H PRN Rai, Ripudeep K, MD      . ondansetron (ZOFRAN) injection 4 mg  4 mg Intravenous Q6H PRN MuSid FalconMD      . pantoprazole (PROTONIX) EC tablet 40 mg  40 mg Oral Daily MuGilles Chiquito, MD   40 mg at 04/26/19 1232  . piperacillin-tazobactam (ZOSYN) IVPB 3.375 g  3.375 g Intravenous Q8H SwLenis NoonRPH      . primidone (MYSOLINE) tablet 100 mg  100 mg Oral Daily MuGilles Chiquito, MD   100 mg at 04/26/19 1232  . senna (SENOKOT) tablet 8.6 mg  1 tablet Oral BID MuGilles Chiquito, MD      . sertraline (ZOLOFT) tablet 100 mg  100 mg Oral Daily MuGilles Chiquito, MD   100 mg at 04/26/19 1231  . sodium chloride flush (NS) 0.9 % injection 3 mL  3 mL Intravenous Once MuGilles Chiquito, MD      . temazepam (RESTORIL) capsule 30 mg  30 mg Oral QHS PRN MuSid FalconMD        Allergies as of 04/25/2019  . (No Known Allergies)     Family History  Problem Relation Age of Onset  . Hypertension Mother   . Hypertension Father   . Diabetes Mellitus II Neg Hx     Social History   Socioeconomic History  . Marital status: Married    Spouse name: Not on file  . Number of children: Not on file  . Years of education: Not on file  . Highest education level: Not on file  Occupational History  . Not on file  Tobacco Use  . Smoking status: Former SmResearch scientist (life sciences). Smokeless  tobacco: Never Used  Substance and Sexual Activity  . Alcohol use: Not Currently  . Drug use: Not Currently  . Sexual activity: Not Currently  Other Topics Concern  . Not on file  Social History Narrative  . Not on file   Social Determinants of Health   Financial Resource Strain:   . Difficulty of Paying Living Expenses:   Food Insecurity:   . Worried About Charity fundraiser in the Last Year:   . Arboriculturist in the Last Year:   Transportation Needs:   . Film/video editor (Medical):   Marland Kitchen Lack of Transportation (Non-Medical):   Physical Activity:   . Days of Exercise per Week:   . Minutes of Exercise per Session:   Stress:   . Feeling of Stress :   Social Connections:   . Frequency of Communication with Friends and Family:   . Frequency of Social Gatherings with Friends and Family:   . Attends Religious Services:   . Active Member of Clubs or Organizations:   . Attends Archivist Meetings:   Marland Kitchen Marital Status:   Intimate Partner Violence:   . Fear of Current or Ex-Partner:   . Emotionally Abused:   Marland Kitchen Physically Abused:   . Sexually Abused:     Review of Systems: Pertinent positive and negative review of systems were noted in the above HPI section.  All other review of systems was otherwise negative.  Physical Exam: Vital signs in last 24 hours: Temp:  [97.8 F (36.6 C)-98.6 F (37 C)] 97.8 F (36.6 C) (03/15 0544) Pulse Rate:  [77-97] 77 (03/15 0544) Resp:  [14-23] 17 (03/15 0544) BP: (96-144)/(39-61) 126/61  (03/15 0544) SpO2:  [92 %-97 %] 97 % (03/15 0544) Weight:  [96 kg] 96 kg (03/14 1718)   General:   Alert,  Well-developed, ill-appearing elderly white male, pleasant and cooperative in NAD, jaundiced Head:  Normocephalic and atraumatic. Eyes:  Sclera icteric.   Conjunctiva pink. Ears:  Normal auditory acuity. Nose:  No deformity, discharge,  or lesions. Mouth:  No deformity or lesions.   Neck:  Supple; no masses or thyromegaly. Lungs scattered rhonchi  . Heart:  Regular rate and rhythm; no murmurs, clicks, rubs,  or gallops. Abdomen: Soft, few bowel sounds present, is diffusely tender across the upper abdomen, no rebound, no palpable mass or hepatosplenomegaly Rectal:  Deferred  Msk:  Symmetrical without gross deformities. . Pulses:  Normal pulses noted. Extremities:  Without clubbing or edema. Neurologic:  Alert and  oriented x4;  grossly normal neurologically. Skin:  Intact without significant lesions or rashes.. Psych:  Alert and cooperative. Normal mood and affect.  Intake/Output from previous day: 03/14 0701 - 03/15 0700 In: 1000 [IV Piggyback:1000] Out: -  Intake/Output this shift: Total I/O In: 0  Out: 175 [Urine:175]  Lab Results: Recent Labs    04/25/19 1721 04/26/19 0532  WBC 15.4* 26.5*  HGB 14.2 12.2*  HCT 43.0 37.3*  PLT 302 288   BMET Recent Labs    04/25/19 1721 04/26/19 0532  NA 130* 128*  K 3.8 4.9  CL 96* 98  CO2 22 20*  GLUCOSE 184* 232*  BUN 15 23  CREATININE 1.10 1.55*  CALCIUM 8.7* 8.0*   LFT Recent Labs    04/26/19 0532  PROT 6.0*  ALBUMIN 2.3*  AST 506*  ALT 298*  ALKPHOS 551*  BILITOT 6.4*   PT/INR Recent Labs    04/26/19 0532  LABPROT 15.4*  INR  1.2     IMPRESSION:   #10 86 year old white male with acute pancreatitis, etiology not entirely certain though ultrasound today suggestive of distal CBD stone or stones. Patient has new diagnosis of probable metastatic cancer with right hepatic lobe lesion and enlarged  gastrohepatic node cannot rule out biliary obstruction secondary to adenopathy  #2 bilateral spiculated lung masses with extensive spinal and right hilar adenopathy  #3 new bilateral pulmonary emboli-diagnosed 1 week ago and on Xarelto #4 anticoagulation-last dose Xarelto today #5 history of atrial fibrillation  Plan; vigorous hydration in setting of acute pancreatitis Agree with IV Zosyn Hold Xarelto Patient will need heparin bridge, have discussed with Dr. Tana Coast.  Will need to hold heparin for 6 hours prior to planned ERCP  Stat MRI/MRCP today  Patient is tentatively scheduled for ERCP with Dr. Henrene Pastor tomorrow morning.  Procedure was discussed in detail with the patient including indications risks and benefits and he is agreeable to proceed.  We discussed risk of bleeding, perforation, and worsening of pancreatitis.  Patient also needs bronchoscopy for diagnosis.  He is now agreeable to have bronchoscopy here while he is hospitalized and in order to minimize sedation, Dr. Hilarie Fredrickson has contacted pulmonary and plan is to coordinate bronchoscopy to be done tomorrow morning at [facility] post ERCP.  Thank you will follow with you     Amy Esterwood PA-C 04/26/2019, 1:40 PM

## 2019-04-27 ENCOUNTER — Other Ambulatory Visit: Payer: Self-pay

## 2019-04-27 ENCOUNTER — Encounter (HOSPITAL_COMMUNITY)
Admission: EM | Disposition: A | Discharge status: Hospice | Payer: Self-pay | Source: Home / Self Care | Attending: Internal Medicine

## 2019-04-27 ENCOUNTER — Encounter (HOSPITAL_COMMUNITY): Payer: Self-pay | Admitting: Internal Medicine

## 2019-04-27 ENCOUNTER — Inpatient Hospital Stay (HOSPITAL_COMMUNITY): Payer: No Typology Code available for payment source | Admitting: Anesthesiology

## 2019-04-27 ENCOUNTER — Inpatient Hospital Stay (HOSPITAL_COMMUNITY): Payer: No Typology Code available for payment source

## 2019-04-27 DIAGNOSIS — K8050 Calculus of bile duct without cholangitis or cholecystitis without obstruction: Secondary | ICD-10-CM

## 2019-04-27 DIAGNOSIS — N179 Acute kidney failure, unspecified: Secondary | ICD-10-CM

## 2019-04-27 DIAGNOSIS — E871 Hypo-osmolality and hyponatremia: Secondary | ICD-10-CM

## 2019-04-27 DIAGNOSIS — K859 Acute pancreatitis without necrosis or infection, unspecified: Secondary | ICD-10-CM

## 2019-04-27 DIAGNOSIS — R590 Localized enlarged lymph nodes: Secondary | ICD-10-CM

## 2019-04-27 DIAGNOSIS — R918 Other nonspecific abnormal finding of lung field: Secondary | ICD-10-CM

## 2019-04-27 HISTORY — PX: ERCP: SHX5425

## 2019-04-27 HISTORY — PX: SPHINCTEROTOMY: SHX5544

## 2019-04-27 HISTORY — PX: REMOVAL OF STONES: SHX5545

## 2019-04-27 LAB — APTT
aPTT: 77 seconds — ABNORMAL HIGH (ref 24–36)
aPTT: 46 seconds — ABNORMAL HIGH (ref 24–36)

## 2019-04-27 LAB — CBC
HCT: 36 % — ABNORMAL LOW (ref 39.0–52.0)
HCT: 37.5 % — ABNORMAL LOW (ref 39.0–52.0)
Hemoglobin: 11.7 g/dL — ABNORMAL LOW (ref 13.0–17.0)
Hemoglobin: 12.1 g/dL — ABNORMAL LOW (ref 13.0–17.0)
MCH: 30.6 pg (ref 26.0–34.0)
MCH: 30.3 pg (ref 26.0–34.0)
MCHC: 32.5 g/dL (ref 30.0–36.0)
MCHC: 32.3 g/dL (ref 30.0–36.0)
MCV: 94.2 fL (ref 80.0–100.0)
MCV: 94 fL (ref 80.0–100.0)
Platelets: 262 10*3/uL (ref 150–400)
Platelets: 295 10*3/uL (ref 150–400)
RBC: 3.82 MIL/uL — ABNORMAL LOW (ref 4.22–5.81)
RBC: 3.99 MIL/uL — ABNORMAL LOW (ref 4.22–5.81)
RDW: 14.7 % (ref 11.5–15.5)
RDW: 14.6 % (ref 11.5–15.5)
WBC: 15.3 10*3/uL — ABNORMAL HIGH (ref 4.0–10.5)
WBC: 10.6 10*3/uL — ABNORMAL HIGH (ref 4.0–10.5)
nRBC: 0 % (ref 0.0–0.2)
nRBC: 0 % (ref 0.0–0.2)

## 2019-04-27 LAB — COMPREHENSIVE METABOLIC PANEL
ALT: 263 U/L — ABNORMAL HIGH (ref 0–44)
ALT: 236 U/L — ABNORMAL HIGH (ref 0–44)
AST: 372 U/L — ABNORMAL HIGH (ref 15–41)
AST: 261 U/L — ABNORMAL HIGH (ref 15–41)
Albumin: 2.3 g/dL — ABNORMAL LOW (ref 3.5–5.0)
Albumin: 2.2 g/dL — ABNORMAL LOW (ref 3.5–5.0)
Alkaline Phosphatase: 522 U/L — ABNORMAL HIGH (ref 38–126)
Alkaline Phosphatase: 625 U/L — ABNORMAL HIGH (ref 38–126)
Anion gap: 9 (ref 5–15)
Anion gap: 7 (ref 5–15)
BUN: 27 mg/dL — ABNORMAL HIGH (ref 8–23)
BUN: 25 mg/dL — ABNORMAL HIGH (ref 8–23)
CO2: 20 mmol/L — ABNORMAL LOW (ref 22–32)
CO2: 20 mmol/L — ABNORMAL LOW (ref 22–32)
Calcium: 8 mg/dL — ABNORMAL LOW (ref 8.9–10.3)
Calcium: 8.2 mg/dL — ABNORMAL LOW (ref 8.9–10.3)
Chloride: 100 mmol/L (ref 98–111)
Chloride: 99 mmol/L (ref 98–111)
Creatinine, Ser: 1.22 mg/dL (ref 0.61–1.24)
Creatinine, Ser: 1.11 mg/dL (ref 0.61–1.24)
GFR calc Af Amer: >60 mL/min (ref >60)
GFR calc Af Amer: >60 mL/min (ref >60)
GFR calc non Af Amer: 54 mL/min — ABNORMAL LOW (ref >60)
GFR calc non Af Amer: >60 mL/min (ref >60)
Glucose, Bld: 124 mg/dL — ABNORMAL HIGH (ref 70–99)
Glucose, Bld: 301 mg/dL — ABNORMAL HIGH (ref 70–99)
Potassium: 4.4 mmol/L (ref 3.5–5.1)
Potassium: 4.9 mmol/L (ref 3.5–5.1)
Sodium: 129 mmol/L — ABNORMAL LOW (ref 135–145)
Sodium: 126 mmol/L — ABNORMAL LOW (ref 135–145)
Total Bilirubin: 5.9 mg/dL — ABNORMAL HIGH (ref 0.3–1.2)
Total Bilirubin: 4.1 mg/dL — ABNORMAL HIGH (ref 0.3–1.2)
Total Protein: 5.9 g/dL — ABNORMAL LOW (ref 6.5–8.1)
Total Protein: 6.1 g/dL — ABNORMAL LOW (ref 6.5–8.1)

## 2019-04-27 LAB — PROTIME-INR
INR: 1.2 (ref 0.8–1.2)
Prothrombin Time: 15.2 seconds (ref 11.4–15.2)

## 2019-04-27 LAB — HEPARIN LEVEL (UNFRACTIONATED): Heparin Unfractionated: 1.24 IU/mL — ABNORMAL HIGH (ref 0.30–0.70)

## 2019-04-27 LAB — GLUCOSE, CAPILLARY
Glucose-Capillary: 105 mg/dL — ABNORMAL HIGH (ref 70–99)
Glucose-Capillary: 116 mg/dL — ABNORMAL HIGH (ref 70–99)
Glucose-Capillary: 139 mg/dL — ABNORMAL HIGH (ref 70–99)
Glucose-Capillary: 242 mg/dL — ABNORMAL HIGH (ref 70–99)
Glucose-Capillary: 273 mg/dL — ABNORMAL HIGH (ref 70–99)

## 2019-04-27 LAB — LIPASE, BLOOD: Lipase: 1537 U/L — ABNORMAL HIGH (ref 11–51)

## 2019-04-27 SURGERY — ERCP, WITH INTERVENTION IF INDICATED
Anesthesia: General

## 2019-04-27 MED ORDER — GUAIFENESIN ER 600 MG PO TB12
600.0000 mg | ORAL_TABLET | Freq: Once | ORAL | Status: AC
  Administered 2019-04-27: 600 mg via ORAL
  Filled 2019-04-27: qty 1

## 2019-04-27 MED ORDER — PHENYLEPHRINE 40 MCG/ML (10ML) SYRINGE FOR IV PUSH (FOR BLOOD PRESSURE SUPPORT)
PREFILLED_SYRINGE | INTRAVENOUS | Status: DC | PRN
  Administered 2019-04-27 (×2): 80 ug via INTRAVENOUS

## 2019-04-27 MED ORDER — ALBUTEROL SULFATE (2.5 MG/3ML) 0.083% IN NEBU
3.0000 mL | INHALATION_SOLUTION | Freq: Four times a day (QID) | RESPIRATORY_TRACT | Status: DC | PRN
  Administered 2019-04-29: 3 mL via RESPIRATORY_TRACT
  Filled 2019-04-27 (×2): qty 3

## 2019-04-27 MED ORDER — GLUCAGON HCL RDNA (DIAGNOSTIC) 1 MG IJ SOLR
INTRAMUSCULAR | Status: AC
  Filled 2019-04-27: qty 1

## 2019-04-27 MED ORDER — DEXAMETHASONE SODIUM PHOSPHATE 10 MG/ML IJ SOLN
INTRAMUSCULAR | Status: DC | PRN
  Administered 2019-04-27: 10 mg via INTRAVENOUS

## 2019-04-27 MED ORDER — FENTANYL CITRATE (PF) 100 MCG/2ML IJ SOLN
INTRAMUSCULAR | Status: AC
  Filled 2019-04-27: qty 2

## 2019-04-27 MED ORDER — SUCCINYLCHOLINE CHLORIDE 200 MG/10ML IV SOSY
PREFILLED_SYRINGE | INTRAVENOUS | Status: DC | PRN
  Administered 2019-04-27: 120 mg via INTRAVENOUS

## 2019-04-27 MED ORDER — PROPOFOL 10 MG/ML IV BOLUS
INTRAVENOUS | Status: DC | PRN
  Administered 2019-04-27: 100 mg via INTRAVENOUS

## 2019-04-27 MED ORDER — INDOMETHACIN 50 MG RE SUPP
RECTAL | Status: AC
  Filled 2019-04-27: qty 2

## 2019-04-27 MED ORDER — ONDANSETRON HCL 4 MG/2ML IJ SOLN
INTRAMUSCULAR | Status: DC | PRN
  Administered 2019-04-27: 4 mg via INTRAVENOUS

## 2019-04-27 MED ORDER — LIDOCAINE 2% (20 MG/ML) 5 ML SYRINGE
INTRAMUSCULAR | Status: DC | PRN
  Administered 2019-04-27: 100 mg via INTRAVENOUS

## 2019-04-27 MED ORDER — HEPARIN (PORCINE) 25000 UT/250ML-% IV SOLN
1400.0000 [IU]/h | INTRAVENOUS | Status: AC
  Administered 2019-04-27: 1250 [IU]/h via INTRAVENOUS
  Administered 2019-04-28: 1400 [IU]/h via INTRAVENOUS
  Filled 2019-04-27: qty 250

## 2019-04-27 MED ORDER — SODIUM CHLORIDE 0.9 % IV SOLN
INTRAVENOUS | Status: DC | PRN
  Administered 2019-04-27: 10:00:00 40 mL

## 2019-04-27 MED ORDER — FENTANYL CITRATE (PF) 250 MCG/5ML IJ SOLN
INTRAMUSCULAR | Status: DC | PRN
  Administered 2019-04-27 (×2): 50 ug via INTRAVENOUS

## 2019-04-27 MED ORDER — GLUCAGON HCL RDNA (DIAGNOSTIC) 1 MG IJ SOLR
INTRAMUSCULAR | Status: DC | PRN
  Administered 2019-04-27: .5 mg via INTRAVENOUS

## 2019-04-27 MED ORDER — LACTATED RINGERS IV SOLN
INTRAVENOUS | Status: DC
  Administered 2019-04-27: 1000 mL via INTRAVENOUS

## 2019-04-27 NOTE — Progress Notes (Signed)
NAME:  Dennis Stephens, MRN:  [id number], DOB:  Mar 29, 1933, LOS: 2 ADMISSION DATE:  04/25/2019, CONSULTATION DATE: 04/26/2019 REFERRING MD: Dr. Tana Coast, CHIEF COMPLAINT: Lung mass  Brief History   86 year old gentleman, newly found lung mass on last hospital admission.  Patient was discharged with follow-up with the [place] however came back with gallstone pancreatitis.  Currently here with plans for ERCP asked to see patient again to discuss potential biopsy.  History of present illness   86 year old gentleman past medical history of tobacco abuse, newly diagnosed bilateral PE, currently on Xarelto.  Recently admitted last week with plans for navigational bronchoscopy and EBUS bronchoscopy with TB NA of the mediastinum for last Friday.  However patient made decision for requesting discharge home and follow-up at the [place].  Patient return to the hospital yesterday with abdominal pain found to have gallstone pancreatitis.  GI has seen the patient with recommendations for ERCP.  Unfortunately patient is currently taking Xarelto.  He did receive Xarelto dosing this morning.  Patient is a former smoker.  Quit 35 years ago.  First wife passed from lung cancer.  She was also a smoker.  Imaging of the abdomen revealed a 3.5 cm CBD with stone.  Past Medical History   Past Medical History:  Diagnosis Date  . A-fib ([place])   . DM2 (diabetes mellitus, type 2) ([place])   . Hypertension     Significant Hospital Events   04/27/2019: ERCP  Consults:  Gastroenterology  Procedures:  04/27/2019: ERCP pending 04/28/2019: Bronchoscopy at [facility], pending  Significant Diagnostic Tests:   CT scan of the chest with left upper lobe lung mass and associated mediastinal adenopathy.  Micro Data:    Antimicrobials:   Piperacillin tazobactam  Interim history/subjective:   Patient doing well. No issues. Tolerated ERCP well.   Objective   Blood pressure (!) 154/66, pulse 79, temperature 97.7 F (36.5 C), resp.  rate 18, height 5\' 9"  (1.753 m), weight 96 kg, SpO2 97 %.        Intake/Output Summary (Last 24 hours) at 04/27/2019 1758 Last data filed at 04/27/2019 1500 Gross per 24 hour  Intake 3917.21 ml  Output 700 ml  Net 3217.21 ml   Filed Weights   04/25/19 1718 04/27/19 0842  Weight: 96 kg 96 kg    Examination: General: elderly male, resting in bed  HENT: NCAT, sclera clear  Lungs: CTAB, no crackles, no wheeze  Cardiovascular: RRR, s1 s2  Abdomen: soft, NT ND  Extremities: no edema  Neuro: alert oriented  GU: deferred   Resolved Hospital Problem list     Assessment & Plan:   Bilateral upper lobe solitary pulmonary nodules, enlarged 4R lymphnode - imaging concerning for bronchogenic carcinoma - patient consented at bedside this evening  - NPO midnight  - plans for NAV+EBUS Bronchoscopy  - Heparin ggt held at 6am tomorrow  - transfer [facility] for case   Obstructed common bile duct, gallstone pancreatitis -s/p ERCP  - doing good stable.  Best practice:  Diet: N.p.o. midnight 04/27/2019 DVT prophylaxis: Heparin drip, stop at 6AM tomorrow  Code Status: DNR  Labs   CBC: Recent Labs  Lab 04/21/19 0510 04/22/19 0734 04/25/19 1721 04/26/19 0532 04/27/19 0201  WBC 18.1* 14.9* 15.4* 26.5* 15.3*  HGB 14.5 12.9* 14.2 12.2* 11.7*  HCT 42.4 38.1* 43.0 37.3* 36.0*  MCV 92.0 90.9 92.1 94.2 94.2  PLT 313 238 302 288 403    Basic Metabolic Panel: Recent Labs  Lab 04/21/19 0707  04/22/19 0734 04/25/19 1721 04/26/19 0532 04/27/19 0201  NA 129* 127* 130* 128* 129*  K 4.5 4.3 3.8 4.9 4.4  CL 94* 96* 96* 98 100  CO2 21* 20* 22 20* 20*  GLUCOSE 433* 206* 184* 232* 124*  BUN 17 32* 15 23 27*  CREATININE 1.37* 1.46* 1.10 1.55* 1.22  CALCIUM 9.1 8.5* 8.7* 8.0* 8.0*   GFR: Estimated Creatinine Clearance: 52.4 mL/min (by C-G formula based on SCr of 1.22 mg/dL). Recent Labs  Lab 04/22/19 0734 04/25/19 1721 04/26/19 0532 04/27/19 0201  WBC 14.9* 15.4* 26.5*  15.3*    Liver Function Tests: Recent Labs  Lab 04/20/19 2247 04/21/19 0707 04/25/19 1721 04/26/19 0532 04/27/19 0201  AST 216* 326* 287* 506* 372*  ALT 155* 223* 200* 298* 263*  ALKPHOS 468* 472* 569* 551* 522*  BILITOT 4.3* 6.8* 4.3* 6.4* 5.9*  PROT 7.6 7.4 6.7 6.0* 5.9*  ALBUMIN 3.7 3.4* 2.6* 2.3* 2.3*   Recent Labs  Lab 04/20/19 2247 04/25/19 1721 04/26/19 0532 04/27/19 0201  LIPASE 39 2,207* 1,879* 1,537*   No results for input(s): AMMONIA in the last 168 hours.  ABG    Component Value Date/Time   TCO2 25 01/20/2019 1823     Coagulation Profile: Recent Labs  Lab 04/26/19 0532  INR 1.2    Cardiac Enzymes: No results for input(s): CKTOTAL, CKMB, CKMBINDEX, TROPONINI in the last 168 hours.  HbA1C: Hgb A1c MFr Bld  Date/Time Value Ref Range Status  04/21/2019 05:34 AM 8.7 (H) 4.8 - 5.6 % Final    Comment:    (NOTE) Pre diabetes:          5.7%-6.4% Diabetes:              >6.4% Glycemic control for   <7.0% adults with diabetes     CBG: Recent Labs  Lab 04/26/19 2330 04/27/19 0421 04/27/19 0743 04/27/19 1110 04/27/19 1551  GLUCAP 125* 105* 116* 139* 242*     Garner Nash, DO [facility] 04/27/2019 5:58 PM

## 2019-04-27 NOTE — H&P (View-Only) (Signed)
NAME:  Dennis Stephens, MRN:  [id number], DOB:  August 09, 1933, LOS: 2 ADMISSION DATE:  04/25/2019, CONSULTATION DATE: 04/26/2019 REFERRING MD: Dr. Tana Coast, CHIEF COMPLAINT: Lung mass  Brief History   Dennis Stephens, newly found lung mass on last hospital admission.  Patient was discharged with follow-up with the [place] however came back with gallstone pancreatitis.  Currently here with plans for ERCP asked to see patient again to discuss potential biopsy.  History of present illness   Dennis Stephens past medical history of tobacco abuse, newly diagnosed bilateral PE, currently on Xarelto.  Recently admitted last week with plans for navigational bronchoscopy and EBUS bronchoscopy with TB NA of the mediastinum for last Friday.  However patient made decision for requesting discharge home and follow-up at the [place].  Patient return to the hospital yesterday with abdominal pain found to have gallstone pancreatitis.  GI has seen the patient with recommendations for ERCP.  Unfortunately patient is currently taking Xarelto.  He did receive Xarelto dosing this morning.  Patient is a former smoker.  Quit 35 years ago.  First wife passed from lung cancer.  She was also a smoker.  Imaging of the abdomen revealed a 3.5 cm CBD with stone.  Past Medical History   Past Medical History:  Diagnosis Date  . A-fib ([place])   . DM2 (diabetes mellitus, type 2) ([place])   . Hypertension     Significant Hospital Events   04/27/2019: ERCP  Consults:  Gastroenterology  Procedures:  04/27/2019: ERCP pending 04/28/2019: Bronchoscopy at [facility], pending  Significant Diagnostic Tests:   CT scan of the chest with left upper lobe lung mass and associated mediastinal adenopathy.  Micro Data:    Antimicrobials:   Piperacillin tazobactam  Interim history/subjective:   Patient doing well. No issues. Tolerated ERCP well.   Objective   Blood pressure (!) 154/66, pulse 79, temperature 97.7 F (36.5 C), resp.  rate 18, height 5\' 9"  (1.753 m), weight 96 kg, SpO2 97 %.        Intake/Output Summary (Last 24 hours) at 04/27/2019 1758 Last data filed at 04/27/2019 1500 Gross per 24 hour  Intake 3917.21 ml  Output 700 ml  Net 3217.21 ml   Filed Weights   04/25/19 1718 04/27/19 0842  Weight: 96 kg 96 kg    Examination: General: elderly male, resting in bed  HENT: NCAT, sclera clear  Lungs: CTAB, no crackles, no wheeze  Cardiovascular: RRR, s1 s2  Abdomen: soft, NT ND  Extremities: no edema  Neuro: alert oriented  GU: deferred   Resolved Hospital Problem list     Assessment & Plan:   Bilateral upper lobe solitary pulmonary nodules, enlarged 4R lymphnode - imaging concerning for bronchogenic carcinoma - patient consented at bedside this evening  - NPO midnight  - plans for NAV+EBUS Bronchoscopy  - Heparin ggt held at 6am tomorrow  - transfer to [facility] tomorrow for case   Obstructed common bile duct, gallstone pancreatitis -s/p ERCP  - doing good stable.  Best practice:  Diet: N.p.o. midnight 04/27/2019 DVT prophylaxis: Heparin drip, stop at 6AM tomorrow  Code Status: DNR  Labs   CBC: Recent Labs  Lab 04/21/19 0510 04/22/19 0734 04/25/19 1721 04/26/19 0532 04/27/19 0201  WBC 18.1* 14.9* 15.4* 26.5* 15.3*  HGB 14.5 12.9* 14.2 12.2* 11.7*  HCT 42.4 38.1* 43.0 37.3* 36.0*  MCV 92.0 90.9 92.1 94.2 94.2  PLT 313 238 302 288 630    Basic Metabolic Panel: Recent Labs  Lab 04/21/19 0707  04/22/19 0734 04/25/19 1721 04/26/19 0532 04/27/19 0201  NA 129* 127* 130* 128* 129*  K 4.5 4.3 3.8 4.9 4.4  CL 94* 96* 96* 98 100  CO2 21* 20* 22 20* 20*  GLUCOSE 433* 206* 184* 232* 124*  BUN 17 32* 15 23 27*  CREATININE 1.37* 1.46* 1.10 1.55* 1.22  CALCIUM 9.1 8.5* 8.7* 8.0* 8.0*   GFR: Estimated Creatinine Clearance: 52.4 mL/min (by C-G formula based on SCr of 1.22 mg/dL). Recent Labs  Lab 04/22/19 0734 04/25/19 1721 04/26/19 0532 04/27/19 0201  WBC 14.9* 15.4* 26.5*  15.3*    Liver Function Tests: Recent Labs  Lab 04/20/19 2247 04/21/19 0707 04/25/19 1721 04/26/19 0532 04/27/19 0201  AST 216* 326* 287* 506* 372*  ALT 155* 223* 200* 298* 263*  ALKPHOS 468* 472* 569* 551* 522*  BILITOT 4.3* 6.8* 4.3* 6.4* 5.9*  PROT 7.6 7.4 6.7 6.0* 5.9*  ALBUMIN 3.7 3.4* 2.6* 2.3* 2.3*   Recent Labs  Lab 04/20/19 2247 04/25/19 1721 04/26/19 0532 04/27/19 0201  LIPASE 39 2,207* 1,879* 1,537*   No results for input(s): AMMONIA in the last 168 hours.  ABG    Component Value Date/Time   TCO2 25 01/20/2019 1823     Coagulation Profile: Recent Labs  Lab 04/26/19 0532  INR 1.2    Cardiac Enzymes: No results for input(s): CKTOTAL, CKMB, CKMBINDEX, TROPONINI in the last 168 hours.  HbA1C: Hgb A1c MFr Bld  Date/Time Value Ref Range Status  04/21/2019 05:34 AM 8.7 (H) 4.8 - 5.6 % Final    Comment:    (NOTE) Pre diabetes:          5.7%-6.4% Diabetes:              >6.4% Glycemic control for   <7.0% adults with diabetes     CBG: Recent Labs  Lab 04/26/19 2330 04/27/19 0421 04/27/19 0743 04/27/19 1110 04/27/19 1551  GLUCAP 125* 105* 116* 139* 242*     Dennis Nash, DO [place] [facility] 04/27/2019 5:58 PM

## 2019-04-27 NOTE — Progress Notes (Signed)
[facility] scheduled to pick up patient at [facility] at 1300 to [facility]30 on 04/28/2019.

## 2019-04-27 NOTE — Progress Notes (Signed)
[place] for IV heparin Indication: Recent PE, Xarelto on hold  No Known Allergies  Patient Measurements: Height: 5\' 9"  (175.3 cm) Weight: 211 lb 10.3 oz (96 kg) IBW/kg (Calculated) : 70.7 Heparin Dosing Weight: 91 kg  Vital Signs: Temp: 97.8 F (36.6 C) (03/15 2030) Temp Source: Oral (03/15 2030) BP: 129/60 (03/15 2030) Pulse Rate: 81 (03/15 2030)  Labs: Recent Labs    04/25/19 1721 04/25/19 1721 04/25/19 1730 04/25/19 1929 04/26/19 0532 04/26/19 1429 04/27/19 0201  HGB 14.2   < >  --   --  12.2*  --  11.7*  HCT 43.0  --   --   --  37.3*  --  36.0*  PLT 302  --   --   --  288  --  262  APTT  --   --   --   --   --  38* 77*  LABPROT  --   --   --   --  15.4*  --   --   INR  --   --   --   --  1.2  --   --   HEPARINUNFRC  --   --   --   --   --  1.72* 1.24*  CREATININE 1.10  --   --   --  1.55*  --   --   TROPONINIHS  --   --  8 8  --   --   --    < > = values in this interval not displayed.    Estimated Creatinine Clearance: 41.3 mL/min (A) (by C-G formula based on SCr of 1.55 mg/dL (H)).   Medical History: Past Medical History:  Diagnosis Date  . A-fib ([place])   . DM2 (diabetes mellitus, type 2) ([place])   . Hypertension    Assessment: Pt is a 32 yoM with PMH significant for afib, recently diagnosed PE, recently diagnosed likely metastatic cancer with pulmonary nodules.   Pt was recently admitted from 04/20/19 - 04/22/19 at which time he was diagnosed with bilateral PE and prescribed rivaroxaban. Rivaroxaban 15 mg PO BID initiated on 3/11, last dose was given at 0815 on 04/26/19. Anticoagulation being converted to heparin drip at this time for anticipated procedures. Baseline heparin level falsely elevated due to DOAC.   Today, 04/27/19  CBC: Hgb decreased to 11.7, Pltc WNL  SCr 1.22, CrCl ~ 52 mL/min  Heparin level = 1.24 units/mL, remains falsely elevated due to recent DOAC  APTT = 77 seconds, therapeutic on heparin  infusion at 1250 units/hr  Will need to dose/monitor using aPTT until heparin level and aPTT correlate  No bleeding or infusion issues per nursing  Goal of Therapy:  APTT 66-102 seconds Heparin level 0.3-0.7 units/ml Monitor platelets by anticoagulation protocol: Yes   Plan:   Heparin infusion to be turned off at 0330 on 04/27/19 for ERCP per GI's orders  MD-please advise when safe to resume heparin post-procedure   Lindell Spar, PharmD, BCPS Clinical Pharmacist 04/27/2019,3:24 AM

## 2019-04-27 NOTE — Progress Notes (Signed)
PHARMACY - PHYSICIAN COMMUNICATION CRITICAL VALUE ALERT - BLOOD CULTURE IDENTIFICATION (BCID)  Dennis Stephens is an 86 y.o. male who presented to [facility] on 04/25/2019 with a chief complaint of gallstone pancreatitis.  Assessment: Gram + Cocci in Clusters (found in aerobic bottle only 1 out of 3 bottles)  Name of physician (or Provider) Contacted: Dr Erlinda Hong  Current antibiotics: Zosyn 3.375gm IV q8h  Changes to prescribed antibiotics recommended:  No changes recommended at this time  Dennis Stephens, PharmD 04/27/2019  7:34 PM

## 2019-04-27 NOTE — Interval H&P Note (Signed)
History and Physical Interval Note:  04/27/2019 9:15 AM  I saw the patient prior to his procedure.  He was examined.  Case discussed with inpatient GI team and x-rays reviewed. Dennis Stephens  has presented today for surgery, with the diagnosis of CBD stone.  The various methods of treatment have been discussed with the patient and family. After consideration of risks, benefits and other options for treatment, the patient has consented to  Procedure(s): ENDOSCOPIC RETROGRADE CHOLANGIOPANCREATOGRAPHY (ERCP) (N/A) as a surgical intervention.  The patient's history has been reviewed, patient examined, no change in status, stable for surgery.  I have reviewed the patient's chart and labs.  Questions were answered to the patient's satisfaction.     Scarlette Shorts, MD

## 2019-04-27 NOTE — Progress Notes (Signed)
PROGRESS NOTE  Jerauld Bostwick PPH:[id number] DOB: [birth year] DOA: 04/25/2019 PCP: [facility]  Brief summary:  Patient is a 86 year old male with history of hypertension, A. fib, recent diagnosis of PE, likely metastatic CA with spiculated pulmonary nodules presented with abdominal pain.  Patient reported pain 10/10 in upper quadrants, nausea and vomiting.  Patient also reported abdominal swelling, yellowing of his eyes and under the tongue.  Patient was recently admitted (04/20/2019-04/22/2019) for chest pain and was found to have PE for which he was started on Xarelto.  Patient was also found to have hilar and mediastinal lymphadenopathy with possible liver mets, spiculated pulmonary nodules during previous admission, he was scheduled for EBUS and bronchoscopy, declined further work-up inpatient and was discharged at his request.  He is requesting evaluation for hospice and palliative care. In ED, patient was found to have elevated lipase, 2207, elevated AST ALT alk phos 569 with leukocytosis Abdominal ultrasound showed normal gallbladder, biliary duct dilatation with choledocholithiasis, recommended ERCP    HPI/Recap of past 24 hours:  Patient is seen after return from ERCP, he remained on heparin drip dissipating of bronchoscopy tomorrow Wife at bedside  Assessment/Plan: Principal Problem:   Acute pancreatitis Active Problems:   Pulmonary embolism ([place])   Lung mass   Essential hypertension   Elevated LFTs   Bile duct stone   Principal Problem:  Acute pancreatitis secondary to choledocholithiasis, acute transaminitis - on IV Zosyn due to  concern for cholangitis, blood culture no growth S/p ERCP with sphincterotomy; with common duct stone extraction on 3/16 by GI Dr Henrene Pastor -home meds Xarelto held , currently on heparin drip -We will follow GI recommendation  Active Problems:   Pulmonary embolism ([place]), recent -Likely due to underlying malignancy -Currently on heparin  drip , Xarelto on hold due to planned procedures, resume after procedure   hilar and mediastinal lymphadenopathy, spiculated pulmonary nodules -Seen on CT during previous admission, pulmonology was consulted, patient was scheduled for EBUS and bronchoscopy next day on 04/23/2019.  Patient however declined to have any further work-up here of the lung mass/malignancy. -Plan for bronchoscopy tomorrow on 3/17, resume xarelto if ok with GI and pulm after procedure -Pulmonary critical care following -Palliative care consult has been placed per patient's request  Hyponatremia -Likely due to poor p.o. intake, pseudohyponatremia secondary to hyperglycemia, possible SIADH in the setting of cancer - -Continue IV fluids for now, monitor  AKI on CKDII -Creatinine peaked at 1.55, baseline around 1.1 -Hold losartan, continue IV hydration -Creatinine improving, repeat in a.m. -Renal dosing meds    Essential hypertension -Continue Coreg - hold losartan, amlodipine.  BP currently soft  Diabetes mellitus type 2, NIDDM -Hold Amaryl, continue sliding scale insulin  Tremor Continue primidone, hold if any worsening effects   Body mass index is 31.25 kg/m.    Code Status: DNR DVT Prophylaxis: home meds Xarelto held , on heparin drip due to needing procedures   Family Communication: Wife at bedside  Disposition Plan:    Patient came from:         home  Anticipated d/c place:  TBD  Barriers to d/c OR conditions which need to be met to effect a safe d/c:  Need bronchoscopy, palliative care consult   Consultants:  Pulmonary critical care  GI  Palliative care  Procedures:  ERCP on March 16  Antibiotics:  Zosyn   Objective: BP (!) 154/66 (BP Location: Right Arm)    Pulse 79    Temp 97.7 F (36.5 C)    Resp 18    Ht '5\' 9"'  (1.753 m)    Wt 96 kg    SpO2 97%    BMI 31.25 kg/m    Intake/Output Summary (Last 24 hours) at 04/27/2019 1455 Last data filed at 04/27/2019 1013 Gross per 24 hour  Intake 3367.33 ml  Output 700 ml  Net 2667.33 ml   Filed Weights   04/25/19 1718 04/27/19 0842  Weight: 96 kg 96 kg    Exam: Patient is examined daily including today on 04/27/2019, exams remain the same as of yesterday except that has changed    General: weak but  NAD, aaox3  Cardiovascular: RRR  Respiratory: CTABL  Abdomen: Soft/ND/NT, positive BS  Musculoskeletal: No Edema  Neuro: alert, oriented x3  Data Reviewed: Basic Metabolic Panel: Recent Labs  Lab 04/21/19 0707 04/22/19 0734 04/25/19 1721 04/26/19 0532 04/27/19 0201  NA 129* 127* 130* 128* 129*  K 4.5 4.3 3.8 4.9 4.4  CL 94* 96* 96* 98 100  CO2 21* 20* 22 20* 20*  GLUCOSE 433* 206* 184* 232* 124*  BUN 17 32* 15 23 27*  CREATININE 1.37* 1.46* 1.10 1.55* 1.22  CALCIUM 9.1 8.5* 8.7* 8.0* 8.0*   Liver Function Tests: Recent Labs  Lab 04/20/19 2247 04/21/19 0707 04/25/19 1721 04/26/19 0532 04/27/19 0201  AST 216* 326* 287* 506* 372*  ALT 155* 223* 200* 298* 263*  ALKPHOS 468* 472* 569* 551* 522*  BILITOT 4.3* 6.8* 4.3* 6.4* 5.9*  PROT 7.6 7.4 6.7 6.0* 5.9*  ALBUMIN 3.7 3.4* 2.6* 2.3* 2.3*   Recent Labs  Lab 04/20/19 2247 04/25/19 1721 04/26/19 0532 04/27/19 0201  LIPASE 39 2,207* 1,879* 1,537*   No results for input(s): AMMONIA in the last 168 hours. CBC: Recent Labs  Lab 04/21/19 0510 04/22/19 0734 04/25/19 1721 04/26/19 0532 04/27/19 0201  WBC 18.1* 14.9* 15.4* 26.5* 15.3*  HGB 14.5 12.9* 14.2 12.2* 11.7*  HCT 42.4 38.1* 43.0 37.3* 36.0*  MCV 92.0 90.9 92.1 94.2 94.2  PLT 313 238 302 288 262   Cardiac Enzymes:   No results for input(s): CKTOTAL, CKMB, CKMBINDEX, TROPONINI in the last 168 hours. BNP (last 3 results) Recent Labs    04/25/19 1730  BNP 128.3*    ProBNP (last 3 results) No results for input(s): PROBNP in the last 8760 hours.  CBG: Recent Labs   Lab 04/26/19 2032 04/26/19 2330 04/27/19 0421 04/27/19 0743 04/27/19 1110  GLUCAP 87 125* 105* 116* 139*    Recent Results (from the past 240 hour(s))  SARS CORONAVIRUS 2 (TAT 6-24 HRS) Nasopharyngeal Nasopharyngeal Swab     Status: None   Collection Time: 04/21/19  7:08 AM   Specimen: Nasopharyngeal Swab  Result Value Ref Range Status   SARS Coronavirus 2 NEGATIVE NEGATIVE Final    Comment: (NOTE) SARS-CoV-2 target nucleic acids are NOT DETECTED. The SARS-CoV-2 RNA is generally detectable in upper and lower respiratory specimens during the acute phase of infection. Negative results do not preclude SARS-CoV-2 infection, do not rule out co-infections with other pathogens, and should not be used  as the sole basis for treatment or other patient management decisions. Negative results must be combined with clinical observations, patient history, and epidemiological information. The expected result is Negative. Fact Sheet for Patients: SugarRoll.be Fact Sheet for Healthcare Providers: https://www.woods-mathews.com/ This test is not yet approved or cleared by the [place] FDA and  has been authorized for detection and/or diagnosis of SARS-CoV-2 by FDA under an Emergency Use Authorization (EUA). This EUA will remain  in effect (meaning this test can be used) for the duration of the COVID-19 declaration under Section 56 4(b)(1) of the Act, 21 U.S.C. section 360bbb-3(b)(1), unless the authorization is terminated or revoked sooner. Performed at [facility], [place] [address], [place], [place] [address]   SARS CORONAVIRUS 2 (TAT 6-24 HRS) Nasopharyngeal Nasopharyngeal Swab     Status: None   Collection Time: 04/25/19  7:59 PM   Specimen: Nasopharyngeal Swab  Result Value Ref Range Status   SARS Coronavirus 2 NEGATIVE NEGATIVE Final    Comment: (NOTE) SARS-CoV-2 target nucleic acids are NOT DETECTED. The SARS-CoV-2 RNA is generally  detectable in upper and lower respiratory specimens during the acute phase of infection. Negative results do not preclude SARS-CoV-2 infection, do not rule out co-infections with other pathogens, and should not be used as the sole basis for treatment or other patient management decisions. Negative results must be combined with clinical observations, patient history, and epidemiological information. The expected result is Negative. Fact Sheet for Patients: SugarRoll.be Fact Sheet for Healthcare Providers: https://www.woods-mathews.com/ This test is not yet approved or cleared by the [place] FDA and  has been authorized for detection and/or diagnosis of SARS-CoV-2 by FDA under an Emergency Use Authorization (EUA). This EUA will remain  in effect (meaning this test can be used) for the duration of the COVID-19 declaration under Section 56 4(b)(1) of the Act, 21 U.S.C. section 360bbb-3(b)(1), unless the authorization is terminated or revoked sooner. Performed at [facility], [place] [address], [place], [place] [address]   Culture, blood (Routine X 2) w Reflex to ID Panel     Status: None (Preliminary result)   Collection Time: 04/26/19  1:02 PM   Specimen: BLOOD  Result Value Ref Range Status   Specimen Description   Final    BLOOD RIGHT ANTECUBITAL Performed at [place] [address] [place], [place] [address]    Special Requests   Final    BOTTLES DRAWN AEROBIC AND ANAEROBIC Blood Culture adequate volume Performed at [place] [address], [place], [place] [address]    Culture   Final    NO GROWTH < 24 HOURS Performed at [facility] [address], [place], [place] [address]    Report Status PENDING  Incomplete  Culture, blood (Routine X 2) w Reflex to ID Panel     Status: None (Preliminary result)   Collection Time: 04/26/19  1:02 PM   Specimen: BLOOD RIGHT HAND    Result Value Ref Range Status   Specimen Description   Final    BLOOD RIGHT HAND Performed at [facility] [address] [place],  [address]    Special Requests   Final    AEROBIC BOTTLE ONLY Blood Culture results may not be optimal due to an inadequate volume of blood received in culture bottles   Culture   Final    NO GROWTH < 24 HOURS Performed at [place],  [place] [address]    Report Status PENDING  Incomplete     Studies: MR 3D Recon At Scanner  Result Date: 04/26/2019 CLINICAL DATA:  Evaluate for choledocholithiasis. EXAM: MRI ABDOMEN WITHOUT AND WITH CONTRAST (INCLUDING MRCP) TECHNIQUE: Multiplanar multisequence MR imaging of the abdomen was performed both before and after the administration of intravenous contrast. Heavily T2-weighted images of the biliary and pancreatic ducts were obtained, and three-dimensional MRCP images were rendered by post processing. CONTRAST:  74m GADAVIST GADOBUTROL 1 MMOL/ML IV SOLN COMPARISON:  CT AP 04/20/2019 FINDINGS: Lower chest: Trace bilateral pleural effusions. Hepatobiliary: Motion artifact diminishes exam detail within the liver. Corresponding to the CT abnormality is a focal area of increased T2 signal with restricted diffusion within segment 8/4. This measures 1.5 cm. A second more subtle area of increased T2 signal is noted within segment 7, image 12/3 and image 57/5. Enhancement characteristics of both of these lesions are difficult to assess due motion artifact. There is mild diffuse gallbladder wall thickening. Sludge is identified within the neck of the gallbladder. The common bile duct measures 1.1 cm in diameter. At least 4 stones are identified within the distal common bile duct which measure up to 7 mm, image 18/8. Pancreas: Mild diffuse edema is identified. No main duct dilatation or mass identified. No pancreatic necrosis or pseudocyst formation. Spleen:  Within normal limits  in size and appearance. Adrenals/Urinary Tract:  Normal appearance of the adrenal glands. No hydronephrosis identified bilaterally. Bilateral perinephric fat stranding is identified, nonspecific in may be related to prior insult. Several T2 hyperintense kidney lesions are identified. The largest arises from the posterior cortex of the upper pole of left kidney measuring 4.5 cm and is compatible with a simple cyst. No solid enhancing mass or hydronephrosis identified bilaterally. No abnormal signal in or enhancement noted in the region of the recently characterized 10 mm right mid pole lesion. Stomach/Bowel: Visualized portions within the abdomen are unremarkable. Vascular/Lymphatic: Aortic atherosclerosis. No aneurysm. The portal vein remains patent. The splenic vein is also patent. No adenopathy Other:  No free fluid or fluid collections. Musculoskeletal: No suspicious bone lesions identified. IMPRESSION: 1. Exam is positive for choledocholithiasis. At least 4 stones are noted within the distal measuring up to 7 mm. There is increase caliber of the CBD which measures 1.1 cm in maximum dimension. 2. Gallbladder sludge and mild diffuse gallbladder wall thickening. Cannot rule out cholecystitis 3. Two areas of abnormal increased T2 signal are identified within segment 8/4 and segment 7. The enhancement characteristics of these lesions are difficult to assess due to abnormal motion artifact on the postcontrast images. As mentioned on exam from 04/20/2019 these are new when compared with 01/20/2019. Metastatic disease cannot be excluded. 4. Mild diffuse pancreatic edema compatible with uncomplicated acute pancreatitis. No pancreatic necrosis, main duct dilatation or pseudocyst identified at this time. 5. No MRI correlate to the suspicious right kidney lesion identified on recent CT. This remains indeterminate and may not be seen due to motion artifact on today's study. Recommend follow-up imaging after resolution of  this acute episode. Ideally this should be performed as an outpatient when the patient is able to lay motionless and breath hold. 6. These results will be called to the ordering clinician or representative by the Radiologist Assistant, and communication documented in the PACS or [employer] Electronically Signed   By: TKerby MoorsM.D.   On: 04/26/2019 19:40   DG ERCP BILIARY & PANCREATIC DUCTS  Result Date: 04/27/2019 CLINICAL DATA:  ERCP  with sphincterotomy and balloon extraction. EXAM: ERCP TECHNIQUE: Multiple spot images obtained with the fluoroscopic device and submitted for interpretation post-procedure. COMPARISON:  MRCP - 04/26/2019 FLUOROSCOPY TIME:  4 minutes, 28 seconds FINDINGS: Twenty spot intraoperative fluoroscopic images of the right upper abdominal quadrant during ERCP are provided for review Initial image demonstrates an ERCP probe overlying the right upper abdominal quadrant Subsequent images demonstrate selective cannulation and opacification of the common bile duct which appears moderately dilated. There are 4 nonocclusive filling defects within the distal aspect of the CBD (image 4) compatible with choledocholithiasis demonstrated on preceding MRCP. Subsequent images demonstrate insufflation of a balloon within the central/mid aspect of the CBD with subsequent biliary sweeping and presumed sphincterotomy. There is minimal opacification of the intrahepatic biliary tree which appears nondilated. There is no definitive opacification of either the cystic or pancreatic ducts. IMPRESSION: ERCP with findings of choledocholithiasis with subsequent biliary sweeping and sphincterotomy. These images were submitted for radiologic interpretation only. Please see the procedural report for the amount of contrast and the fluoroscopy time utilized. Electronically Signed   By: Sandi Mariscal M.D.   On: 04/27/2019 11:32   MR ABDOMEN MRCP W WO CONTAST  Result Date: 04/26/2019 CLINICAL DATA:  Evaluate  for choledocholithiasis. EXAM: MRI ABDOMEN WITHOUT AND WITH CONTRAST (INCLUDING MRCP) TECHNIQUE: Multiplanar multisequence MR imaging of the abdomen was performed both before and after the administration of intravenous contrast. Heavily T2-weighted images of the biliary and pancreatic ducts were obtained, and three-dimensional MRCP images were rendered by post processing. CONTRAST:  56m GADAVIST GADOBUTROL 1 MMOL/ML IV SOLN COMPARISON:  CT AP 04/20/2019 FINDINGS: Lower chest: Trace bilateral pleural effusions. Hepatobiliary: Motion artifact diminishes exam detail within the liver. Corresponding to the CT abnormality is a focal area of increased T2 signal with restricted diffusion within segment 8/4. This measures 1.5 cm. A second more subtle area of increased T2 signal is noted within segment 7, image 12/3 and image 57/5. Enhancement characteristics of both of these lesions are difficult to assess due motion artifact. There is mild diffuse gallbladder wall thickening. Sludge is identified within the neck of the gallbladder. The common bile duct measures 1.1 cm in diameter. At least 4 stones are identified within the distal common bile duct which measure up to 7 mm, image 18/8. Pancreas: Mild diffuse edema is identified. No main duct dilatation or mass identified. No pancreatic necrosis or pseudocyst formation. Spleen:  Within normal limits in size and appearance. Adrenals/Urinary Tract:  Normal appearance of the adrenal glands. No hydronephrosis identified bilaterally. Bilateral perinephric fat stranding is identified, nonspecific in may be related to prior insult. Several T2 hyperintense kidney lesions are identified. The largest arises from the posterior cortex of the upper pole of left kidney measuring 4.5 cm and is compatible with a simple cyst. No solid enhancing mass or hydronephrosis identified bilaterally. No abnormal signal in or enhancement noted in the region of the recently characterized 10 mm right mid  pole lesion. Stomach/Bowel: Visualized portions within the abdomen are unremarkable. Vascular/Lymphatic: Aortic atherosclerosis. No aneurysm. The portal vein remains patent. The splenic vein is also patent. No adenopathy Other:  No free fluid or fluid collections. Musculoskeletal: No suspicious bone lesions identified. IMPRESSION: 1. Exam is positive for choledocholithiasis. At least 4 stones are noted within the distal measuring up to 7 mm. There is increase caliber of the CBD which measures 1.1 cm in maximum dimension. 2. Gallbladder sludge and mild diffuse gallbladder wall thickening. Cannot rule out cholecystitis 3. Two areas of  abnormal increased T2 signal are identified within segment 8/4 and segment 7. The enhancement characteristics of these lesions are difficult to assess due to abnormal motion artifact on the postcontrast images. As mentioned on exam from 04/20/2019 these are new when compared with 01/20/2019. Metastatic disease cannot be excluded. 4. Mild diffuse pancreatic edema compatible with uncomplicated acute pancreatitis. No pancreatic necrosis, main duct dilatation or pseudocyst identified at this time. 5. No MRI correlate to the suspicious right kidney lesion identified on recent CT. This remains indeterminate and may not be seen due to motion artifact on today's study. Recommend follow-up imaging after resolution of this acute episode. Ideally this should be performed as an outpatient when the patient is able to lay motionless and breath hold. 6. These results will be called to the ordering clinician or representative by the Radiologist Assistant, and communication documented in the PACS or [employer]. Electronically Signed   By: Kerby Moors M.D.   On: 04/26/2019 19:40    Scheduled Meds:  carvedilol  12.5 mg Oral BID WC   docusate sodium  100 mg Oral BID   guaiFENesin  600 mg Oral BID   insulin aspart  0-15 Units Subcutaneous Q4H   pantoprazole (PROTONIX) IV  40 mg  Intravenous Q24H   primidone  100 mg Oral Daily   senna  1 tablet Oral BID   sertraline  100 mg Oral Daily   sodium chloride flush  3 mL Intravenous Once    Continuous Infusions:  sodium chloride 150 mL/hr at 04/27/19 0500   heparin     lactated ringers 1,000 mL (04/27/19 0852)   piperacillin-tazobactam (ZOSYN)  IV 3.375 g (04/27/19 1257)     Time spent: 46mns I have personally reviewed and interpreted on  04/27/2019 daily labs, imagings as discussed above under date review session and assessment and plans.  I reviewed all nursing notes, pharmacy notes, consultant notes,  vitals, pertinent old records  I have discussed plan of care as described above with RN , patient and family on 04/27/2019   FFlorencia ReasonsMD, PhD, FACP  [facility]  Available via Epic secure chat 7am-7pm for nonurgent issues Please page for urgent issues, pager number available through [email or web address] .   04/27/2019, 2:55 PM  LOS: 2 days

## 2019-04-27 NOTE — Op Note (Signed)
[facility] Patient Name: Dennis Stephens Procedure Date: 04/27/2019 MRN: [id number] Attending MD: Docia Chuck. Henrene Pastor , MD Date of Birth: 12/11/1933 CSN: [id number] Age: 86 Admit Type: Inpatient Procedure:                ERCP with sphincterotomy; with common duct stone                            extraction Indications:              Biliary dilation on Ultrasound, Bile duct stone on                            Ultrasound Providers:                Docia Chuck. Henrene Pastor, MD, Cleda Daub, RN, Laverda Sorenson, Technician, Marla Roe, CRNA Referring MD:             [place] hospitalist Medicines:                General Anesthesia Complications:            No immediate complications. Estimated Blood Loss:     Estimated blood loss: none. Procedure:                Pre-Anesthesia Assessment:                           - Prior to the procedure, a History and Physical                            was performed, and patient medications and                            allergies were reviewed. The patient is competent.                            The risks and benefits of the procedure and the                            sedation options and risks were discussed with the                            patient. All questions were answered and informed                            consent was obtained. Patient identification and                            proposed procedure were verified by the physician.                            Mental Status Examination: alert and oriented.  Airway Examination: normal oropharyngeal airway and                            neck mobility. Respiratory Examination: clear to                            auscultation. CV Examination: normal. Prophylactic                            Antibiotics: The patient does not require                            prophylactic antibiotics. Prior Anticoagulants: The                 patient has taken Xarelto (rivaroxaban), last dose                            was 1 day prior to procedure. Also, IV heparin.                            Start 6 hours preprocedure. ASA Grade Assessment:                            IV - A patient with severe systemic disease that is                            a constant threat to life. After reviewing the                            risks and benefits, the patient was deemed in                            satisfactory condition to undergo the procedure.                            The anesthesia plan was to use moderate sedation /                            analgesia (conscious sedation). Immediately prior                            to administration of medications, the patient was                            re-assessed for adequacy to receive sedatives. The                            heart rate, respiratory rate, oxygen saturations,                            blood pressure, adequacy of pulmonary ventilation,                            and response  to care were monitored throughout the                            procedure. The physical status of the patient was                            re-assessed after the procedure.                           After obtaining informed consent, the scope was                            passed under direct vision. Throughout the                            procedure, the patient's blood pressure, pulse, and                            oxygen saturations were monitored continuously. The                            Duodenoscope was introduced through the mouth, and                            used to inject contrast into and used to inject                            contrast into the bile duct. The ERCP was                            accomplished without difficulty. The patient                            tolerated the procedure well. Scope In: Scope Out: Findings:      The esophagus was successfully  intubated under direct vision. The scope       was advanced to a normal major papilla in the descending duodenum       without detailed examination of the pharynx, larynx and associated       structures, and upper GI tract. The upper GI tract was grossly normal.       The bile duct was deeply cannulated with the sphincterotome. Contrast       was injected. I personally interpreted the bile duct images. There was       brisk flow of contrast through the ducts. Image quality was excellent.       Contrast extended to the hepatic ducts. Opacification of the entire       biliary tree except for the cystic duct and gallbladder was successful.       The main bile duct was moderately dilated, with a stones causing an       obstruction. There are approximately 4 stones measuring up to 8 mm.. A       0.035 inch straight standard wire was passed into the biliary tree.       Biliary sphincterotomy was made with a sphincterotome using ERBE  electrocautery. Purulent bile was noted. There was no       post-sphincterotomy bleeding. The biliary tree was swept with a 15 mm       balloon starting at the bifurcation. All stones were removed. No stones       remained. Excellent drainage post procedure. Impression:               - The entire main bile duct was moderately dilated,                            with a stones causing an obstruction.                           - Choledocholithiasis was found with somewhat                            purulent bile. Complete removal was accomplished by                            balloon extraction.                           - A biliary sphincterotomy was performed.                           - The biliary tree was swept times with no residual                            filling defects and excellent drainage. Moderate Sedation:      none Recommendation:           - Clear liquid diet.                           - Resume heparin at prior dose today.                            -Continue antibiotics                           -Inpatient GI team to continue to follow Procedure Code(s):        --- Professional ---                           [phone], Endoscopic retrograde                            cholangiopancreatography (ERCP); with removal of                            calculi/debris from biliary/pancreatic duct(s)                           43262, Endoscopic retrograde                            cholangiopancreatography (ERCP); with  sphincterotomy/papillotomy Diagnosis Code(s):        --- Professional ---                           K80.51, Calculus of bile duct without cholangitis                            or cholecystitis with obstruction                           K83.8, Other specified diseases of biliary tract CPT copyright 2019 [employer]. All rights reserved. The codes documented in this report are preliminary and upon coder review may  be revised to meet current compliance requirements. Docia Chuck. Henrene Pastor, MD 04/27/2019 10:22:33 AM This report has been signed electronically. Number of Addenda: 0

## 2019-04-27 NOTE — Progress Notes (Addendum)
ANTICOAGULATION CONSULT NOTE - Initial Consult  Pharmacy Consult for heparin Indication: Recent PE, Xarelto on hold  No Known Allergies  Patient Measurements: Height: 5\' 9"  (175.3 cm) Weight: 211 lb 10.3 oz (96 kg) IBW/kg (Calculated) : 70.7 Heparin Dosing Weight: 91 kg  Vital Signs: Temp: 97.9 F (36.6 C) (03/16 1104) Temp Source: Oral (03/16 1104) BP: 155/69 (03/16 1104) Pulse Rate: 79 (03/16 1104)  Labs: Recent Labs    04/25/19 1721 04/25/19 1721 04/25/19 1730 04/25/19 1929 04/26/19 0532 04/26/19 1429 04/27/19 0201  HGB 14.2   < >  --   --  12.2*  --  11.7*  HCT 43.0  --   --   --  37.3*  --  36.0*  PLT 302  --   --   --  288  --  262  APTT  --   --   --   --   --  38* 77*  LABPROT  --   --   --   --  15.4*  --   --   INR  --   --   --   --  1.2  --   --   HEPARINUNFRC  --   --   --   --   --  1.72* 1.24*  CREATININE 1.10  --   --   --  1.55*  --  1.22  TROPONINIHS  --   --  8 8  --   --   --    < > = values in this interval not displayed.    Estimated Creatinine Clearance: 52.4 mL/min (by C-G formula based on SCr of 1.22 mg/dL).   Medical History: Past Medical History:  Diagnosis Date  . A-fib ([place])   . DM2 (diabetes mellitus, type 2) ([place])   . Hypertension    Assessment: Pt is a 28 yoM with PMH significant for afib, recently diagnosed PE, recently diagnosed likely metastatic cancer with pulmonary nodules.   Pt was recently admitted from 04/20/19 - 04/22/19 at which time he was diagnosed with bilateral PE and prescribed rivaroxaban. Rivaroxaban 15 mg PO BID initiated on 3/11, last dose was given at 0815 this morning (04/26/19). Anticoagulation being converted to heparin drip at this time for anticipated procedures.   Significant Events: -3/16: ERCP  Today, 04/27/19  CBC: Hgb 11.7 is slightly decreased; Plt WNL & stable  SCr 1.22, CrCl 52 mL/min. Improved  Last dose of rivaroxaban given 3/15 @ 0815, resulting in baseline HL being falsely elevated.  Currently dosing/monitor heparin using APTT.  Heparin turned off at 0330 this morning, pt underwent ERCP. Per Dr. Blanch Media note ok to resume heparin drip today. Discussed with GI PA - resume heparin 6 hours post-op.  Goal of Therapy:  APTT 66-102 seconds Heparin level 0.3-0.7 units/ml Monitor platelets by anticoagulation protocol: Yes   Plan:   Resume heparin infusion at previously therapeutic rate of 1250 units/hr at 1630 this evening  Check 8 hour aPTT/HL. Once aPTT and HL correlate, can transition to monitoring using HL  CBC, aPTT/HL daily while on heparin  Monitor for signs and symptoms of bleeding  Pulmonary: Please advise on when heparin infusion will need to be held prior to bronch on 3/17  Lenis Noon, PharmD 04/27/2019,1:10 PM  Addendum: Discussed with pulmonary, heparin drip to stop at 0600 on 3/17 prior to bronch.   Lenis Noon, PharmD 04/27/19 2:15 PM

## 2019-04-27 NOTE — Transfer of Care (Signed)
Immediate Anesthesia Transfer of Care Note  Patient: Dennis Stephens  Procedure(s) Performed: ENDOSCOPIC RETROGRADE CHOLANGIOPANCREATOGRAPHY (ERCP) (N/A )  Patient Location: PACU  Anesthesia Type:General  Level of Consciousness: sedated  Airway & Oxygen Therapy: Patient Spontanous Breathing and Patient connected to face mask oxygen  Post-op Assessment: Report given to RN and Post -op Vital signs reviewed and stable  Post vital signs: Reviewed and stable  Last Vitals:  Vitals Value Taken Time  BP    Temp    Pulse    Resp    SpO2      Last Pain:  Vitals:   04/27/19 0842  TempSrc: Oral  PainSc: 0-No pain      Patients Stated Pain Goal: 0 (32/35/57 3220)  Complications: No apparent anesthesia complications

## 2019-04-27 NOTE — Anesthesia Procedure Notes (Signed)
Procedure Name: Intubation Date/Time: 04/27/2019 9:35 AM Performed by: Talbot Grumbling, CRNA Pre-anesthesia Checklist: Patient identified, Emergency Drugs available, Suction available and Patient being monitored Patient Re-evaluated:Patient Re-evaluated prior to induction Oxygen Delivery Method: Circle system utilized Preoxygenation: Pre-oxygenation with 100% oxygen Induction Type: IV induction Ventilation: Mask ventilation without difficulty Laryngoscope Size: Mac and 3 Grade View: Grade I Tube type: Oral Tube size: 7.5 mm Number of attempts: 1 Airway Equipment and Method: Stylet Placement Confirmation: positive ETCO2 and breath sounds checked- equal and bilateral Secured at: 23 cm Tube secured with: Tape Dental Injury: Teeth and Oropharynx as per pre-operative assessment

## 2019-04-27 NOTE — Anesthesia Postprocedure Evaluation (Signed)
Anesthesia Post Note  Patient: Dennis Stephens  Procedure(s) Performed: ENDOSCOPIC RETROGRADE CHOLANGIOPANCREATOGRAPHY (ERCP) (N/A ) SPHINCTEROTOMY REMOVAL OF STONES     Patient location during evaluation: PACU Anesthesia Type: General Level of consciousness: awake and alert Pain management: pain level controlled Vital Signs Assessment: post-procedure vital signs reviewed and stable Respiratory status: spontaneous breathing, nonlabored ventilation, respiratory function stable and patient connected to nasal cannula oxygen Cardiovascular status: blood pressure returned to baseline and stable Postop Assessment: no apparent nausea or vomiting Anesthetic complications: no    Last Vitals:  Vitals:   04/27/19 1050 04/27/19 1104  BP: (!) 170/77 (!) 155/69  Pulse: 78 79  Resp: 14 20  Temp:  36.6 C  SpO2: 99% 94%    Last Pain:  Vitals:   04/27/19 1104  TempSrc: Oral  PainSc:                  [name]

## 2019-04-27 NOTE — Anesthesia Preprocedure Evaluation (Addendum)
Anesthesia Evaluation  Patient identified by MRN, date of birth, ID band Patient awake    Reviewed: Allergy & Precautions, NPO status , Patient's Chart, lab work & pertinent test results  Airway Mallampati: II  TM Distance: >3 FB Neck ROM: Limited    Dental no notable dental hx.    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, unknown origin   Pulmonary exam normal breath sounds clear to auscultation       Cardiovascular hypertension, Normal cardiovascular exam Rhythm:Regular Rate:Normal  EF 60-65%   Neuro/Psych negative neurological ROS  negative psych ROS   GI/Hepatic negative GI ROS, Metastatic lesions in liver   Endo/Other  negative endocrine ROSdiabetes  Renal/GU negative Renal ROS  negative genitourinary   Musculoskeletal negative musculoskeletal ROS (+)   Abdominal   Peds negative pediatric ROS (+)  Hematology negative hematology ROS (+)   Anesthesia Other Findings   Reproductive/Obstetrics negative OB ROS                             Anesthesia Physical Anesthesia Plan  ASA: IV  Anesthesia Plan: General   Post-op Pain Management:    Induction: Intravenous  PONV Risk Score and Plan: 2 and Ondansetron, Dexamethasone and Treatment may vary due to age or medical condition  Airway Management Planned: Oral ETT  Additional Equipment:   Intra-op Plan:   Post-operative Plan: Possible Post-op intubation/ventilation  Informed Consent: I have reviewed the patients History and Physical, chart, labs and discussed the procedure including the risks, benefits and alternatives for the proposed anesthesia with the patient or authorized representative who has indicated his/her understanding and acceptance.     Dental advisory given  Plan Discussed with: CRNA and Surgeon  Anesthesia Plan Comments:        Anesthesia Quick Evaluation

## 2019-04-28 ENCOUNTER — Inpatient Hospital Stay (HOSPITAL_COMMUNITY): Payer: No Typology Code available for payment source

## 2019-04-28 ENCOUNTER — Encounter (HOSPITAL_COMMUNITY)
Admission: EM | Disposition: A | Discharge status: Hospice | Payer: Self-pay | Source: Home / Self Care | Attending: Internal Medicine

## 2019-04-28 ENCOUNTER — Other Ambulatory Visit: Payer: Self-pay

## 2019-04-28 ENCOUNTER — Inpatient Hospital Stay (HOSPITAL_COMMUNITY): Payer: No Typology Code available for payment source | Admitting: Certified Registered"

## 2019-04-28 ENCOUNTER — Encounter (HOSPITAL_COMMUNITY): Payer: Self-pay | Admitting: Internal Medicine

## 2019-04-28 DIAGNOSIS — K8051 Calculus of bile duct without cholangitis or cholecystitis with obstruction: Secondary | ICD-10-CM

## 2019-04-28 HISTORY — PX: BRONCHIAL NEEDLE ASPIRATION BIOPSY: SHX5106

## 2019-04-28 HISTORY — PX: BRONCHIAL BIOPSY: SHX5109

## 2019-04-28 HISTORY — PX: ENDOBRONCHIAL ULTRASOUND: SHX5096

## 2019-04-28 HISTORY — PX: BRONCHIAL WASHINGS: SHX5105

## 2019-04-28 HISTORY — PX: VIDEO BRONCHOSCOPY WITH ENDOBRONCHIAL NAVIGATION: SHX6175

## 2019-04-28 HISTORY — PX: BRONCHIAL BRUSHINGS: SHX5108

## 2019-04-28 LAB — GLUCOSE, CAPILLARY
Glucose-Capillary: 140 mg/dL — ABNORMAL HIGH (ref 70–99)
Glucose-Capillary: 144 mg/dL — ABNORMAL HIGH (ref 70–99)
Glucose-Capillary: 145 mg/dL — ABNORMAL HIGH (ref 70–99)
Glucose-Capillary: 164 mg/dL — ABNORMAL HIGH (ref 70–99)
Glucose-Capillary: 183 mg/dL — ABNORMAL HIGH (ref 70–99)
Glucose-Capillary: 194 mg/dL — ABNORMAL HIGH (ref 70–99)
Glucose-Capillary: 261 mg/dL — ABNORMAL HIGH (ref 70–99)
Glucose-Capillary: 286 mg/dL — ABNORMAL HIGH (ref 70–99)

## 2019-04-28 LAB — CBC
HCT: 34.1 % — ABNORMAL LOW (ref 39.0–52.0)
Hemoglobin: 11.3 g/dL — ABNORMAL LOW (ref 13.0–17.0)
MCH: 31 pg (ref 26.0–34.0)
MCHC: 33.1 g/dL (ref 30.0–36.0)
MCV: 93.7 fL (ref 80.0–100.0)
Platelets: 304 10*3/uL (ref 150–400)
RBC: 3.64 MIL/uL — ABNORMAL LOW (ref 4.22–5.81)
RDW: 14.6 % (ref 11.5–15.5)
WBC: 12.2 10*3/uL — ABNORMAL HIGH (ref 4.0–10.5)
nRBC: 0 % (ref 0.0–0.2)

## 2019-04-28 LAB — COMPREHENSIVE METABOLIC PANEL
ALT: 196 U/L — ABNORMAL HIGH (ref 0–44)
AST: 176 U/L — ABNORMAL HIGH (ref 15–41)
Albumin: 2 g/dL — ABNORMAL LOW (ref 3.5–5.0)
Alkaline Phosphatase: 550 U/L — ABNORMAL HIGH (ref 38–126)
Anion gap: 7 (ref 5–15)
BUN: 27 mg/dL — ABNORMAL HIGH (ref 8–23)
CO2: 19 mmol/L — ABNORMAL LOW (ref 22–32)
Calcium: 8.1 mg/dL — ABNORMAL LOW (ref 8.9–10.3)
Chloride: 102 mmol/L (ref 98–111)
Creatinine, Ser: 1.05 mg/dL (ref 0.61–1.24)
GFR calc Af Amer: >60 mL/min (ref >60)
GFR calc non Af Amer: >60 mL/min (ref >60)
Glucose, Bld: 282 mg/dL — ABNORMAL HIGH (ref 70–99)
Potassium: 4.8 mmol/L (ref 3.5–5.1)
Sodium: 128 mmol/L — ABNORMAL LOW (ref 135–145)
Total Bilirubin: 2.9 mg/dL — ABNORMAL HIGH (ref 0.3–1.2)
Total Protein: 5.5 g/dL — ABNORMAL LOW (ref 6.5–8.1)

## 2019-04-28 LAB — LIPASE, BLOOD: Lipase: 206 U/L — ABNORMAL HIGH (ref 11–51)

## 2019-04-28 LAB — HEPARIN LEVEL (UNFRACTIONATED): Heparin Unfractionated: 0.28 IU/mL — ABNORMAL LOW (ref 0.30–0.70)

## 2019-04-28 LAB — APTT: aPTT: 56 seconds — ABNORMAL HIGH (ref 24–36)

## 2019-04-28 SURGERY — VIDEO BRONCHOSCOPY WITH ENDOBRONCHIAL NAVIGATION
Anesthesia: General

## 2019-04-28 MED ORDER — LACTATED RINGERS IV SOLN: INTRAVENOUS | Status: DC | PRN

## 2019-04-28 MED ORDER — PROPOFOL 10 MG/ML IV BOLUS
INTRAVENOUS | Status: DC | PRN
  Administered 2019-04-28: 150 mg via INTRAVENOUS

## 2019-04-28 MED ORDER — GUAIFENESIN-DM 100-10 MG/5ML PO SYRP
5.0000 mL | ORAL_SOLUTION | ORAL | Status: DC | PRN
  Administered 2019-04-28 – 2019-05-03 (×5): 5 mL via ORAL
  Filled 2019-04-28 (×6): qty 10

## 2019-04-28 MED ORDER — SUGAMMADEX SODIUM 200 MG/2ML IV SOLN
INTRAVENOUS | Status: DC | PRN
  Administered 2019-04-28: 300 mg via INTRAVENOUS

## 2019-04-28 MED ORDER — DEXAMETHASONE SODIUM PHOSPHATE 4 MG/ML IJ SOLN
4.0000 mg | Freq: Once | INTRAMUSCULAR | Status: AC
  Administered 2019-04-28: 4 mg via INTRAVENOUS

## 2019-04-28 MED ORDER — FENTANYL CITRATE (PF) 100 MCG/2ML IJ SOLN
INTRAMUSCULAR | Status: DC | PRN
  Administered 2019-04-28: 50 ug via INTRAVENOUS

## 2019-04-28 MED ORDER — HEPARIN (PORCINE) 25000 UT/250ML-% IV SOLN
1400.0000 [IU]/h | INTRAVENOUS | Status: DC
  Filled 2019-04-28: qty 250

## 2019-04-28 MED ORDER — ALBUTEROL SULFATE (2.5 MG/3ML) 0.083% IN NEBU
2.5000 mg | INHALATION_SOLUTION | Freq: Once | RESPIRATORY_TRACT | Status: AC
  Administered 2019-04-28: 2.5 mg via RESPIRATORY_TRACT

## 2019-04-28 MED ORDER — LIDOCAINE 2% (20 MG/ML) 5 ML SYRINGE
INTRAMUSCULAR | Status: DC | PRN
  Administered 2019-04-28: 30 mg via INTRAVENOUS

## 2019-04-28 MED ORDER — METOPROLOL TARTRATE 5 MG/5ML IV SOLN
2.5000 mg | INTRAVENOUS | Status: AC | PRN
  Administered 2019-04-28: 2.5 mg via INTRAVENOUS
  Filled 2019-04-28: qty 5

## 2019-04-28 MED ORDER — HEPARIN (PORCINE) 25000 UT/250ML-% IV SOLN
1400.0000 [IU]/h | INTRAVENOUS | Status: DC
  Administered 2019-04-30: 02:00:00 1400 [IU]/h via INTRAVENOUS
  Filled 2019-04-28 (×3): qty 250

## 2019-04-28 MED ORDER — ROCURONIUM BROMIDE 50 MG/5ML IV SOSY
PREFILLED_SYRINGE | INTRAVENOUS | Status: DC | PRN
  Administered 2019-04-28: 50 mg via INTRAVENOUS
  Administered 2019-04-28: 10 mg via INTRAVENOUS

## 2019-04-28 MED ORDER — SODIUM CHLORIDE 0.9 % IV SOLN: INTRAVENOUS | Status: DC

## 2019-04-28 MED ORDER — PHENOL 1.4 % MT LIQD
1.0000 | OROMUCOSAL | Status: DC | PRN
  Administered 2019-04-28: 1 via OROMUCOSAL
  Filled 2019-04-28: qty 177

## 2019-04-28 MED ORDER — LACTATED RINGERS IV SOLN: INTRAVENOUS | Status: DC

## 2019-04-28 SURGICAL SUPPLY — 46 items
ADAPTER BRONCH F/PENTAX (ADAPTER) ×4 IMPLANT
ADAPTER VALVE BIOPSY EBUS (MISCELLANEOUS) IMPLANT
ADPTR VALVE BIOPSY EBUS (MISCELLANEOUS)
BRUSH CYTOL CELLEBRITY 1.5X140 (MISCELLANEOUS) ×4 IMPLANT
BRUSH SUPERTRAX BIOPSY (INSTRUMENTS) IMPLANT
BRUSH SUPERTRAX NDL-TIP CYTO (INSTRUMENTS) ×4 IMPLANT
CANISTER SUCT 3000ML PPV (MISCELLANEOUS) ×4 IMPLANT
CHANNEL WORK EXTEND EDGE 180 (KITS) IMPLANT
CHANNEL WORK EXTEND EDGE 45 (KITS) IMPLANT
CHANNEL WORK EXTEND EDGE 90 (KITS) IMPLANT
CONT SPEC 4OZ CLIKSEAL STRL BL (MISCELLANEOUS) ×4 IMPLANT
COVER BACK TABLE 60X90IN (DRAPES) ×4 IMPLANT
FILTER STRAW FLUID ASPIR (MISCELLANEOUS) IMPLANT
FORCEPS BIOP SUPERTRX PREMAR (INSTRUMENTS) ×4 IMPLANT
GAUZE SPONGE 4X4 12PLY STRL (GAUZE/BANDAGES/DRESSINGS) ×4 IMPLANT
GLOVE SURG SS PI 7.5 STRL IVOR (GLOVE) ×8 IMPLANT
GOWN STRL REUS W/ TWL LRG LVL3 (GOWN DISPOSABLE) ×4 IMPLANT
GOWN STRL REUS W/TWL LRG LVL3 (GOWN DISPOSABLE) ×4
KIT CLEAN ENDO COMPLIANCE (KITS) ×4 IMPLANT
KIT LOCATABLE GUIDE (CANNULA) IMPLANT
KIT MARKER FIDUCIAL DELIVERY (KITS) IMPLANT
KIT PROCEDURE EDGE 180 (KITS) IMPLANT
KIT PROCEDURE EDGE 45 (KITS) IMPLANT
KIT PROCEDURE EDGE 90 (KITS) IMPLANT
KIT TURNOVER KIT B (KITS) ×4 IMPLANT
MARKER SKIN DUAL TIP RULER LAB (MISCELLANEOUS) ×4 IMPLANT
NDL SUPERTRX PREMARK BIOPSY (NEEDLE) ×2 IMPLANT
NEEDLE SUPERTRX PREMARK BIOPSY (NEEDLE) ×4 IMPLANT
NS IRRIG 1000ML POUR BTL (IV SOLUTION) ×4 IMPLANT
OIL SILICONE PENTAX (PARTS (SERVICE/REPAIRS)) ×4 IMPLANT
PAD ARMBOARD 7.5X6 YLW CONV (MISCELLANEOUS) ×8 IMPLANT
PATCHES PATIENT (LABEL) ×12 IMPLANT
SOL ANTI FOG 6CC (MISCELLANEOUS) ×2 IMPLANT
SOLUTION ANTI FOG 6CC (MISCELLANEOUS) ×2
SYR 20CC LL (SYRINGE) ×4 IMPLANT
SYR 20ML ECCENTRIC (SYRINGE) ×4 IMPLANT
SYR 50ML SLIP (SYRINGE) ×4 IMPLANT
TOWEL OR 17X24 6PK STRL BLUE (TOWEL DISPOSABLE) ×4 IMPLANT
TRAP SPECIMEN MUCOUS 40CC (MISCELLANEOUS) IMPLANT
TUBE CONNECTING 20'X1/4 (TUBING) ×1
TUBE CONNECTING 20X1/4 (TUBING) ×3 IMPLANT
UNDERPAD 30X30 (UNDERPADS AND DIAPERS) ×4 IMPLANT
VALVE BIOPSY  SINGLE USE (MISCELLANEOUS) ×2
VALVE BIOPSY SINGLE USE (MISCELLANEOUS) ×2 IMPLANT
VALVE SUCTION BRONCHIO DISP (MISCELLANEOUS) ×4 IMPLANT
WATER STERILE IRR 1000ML POUR (IV SOLUTION) ×4 IMPLANT

## 2019-04-28 NOTE — Progress Notes (Signed)
PROGRESS NOTE  Dennis Stephens UDJ:[id number] DOB: 20-Jan-1934 DOA: 04/20/2019 PCP: [facility]  Brief summary: Patient is a 86 year old male with history of hypertension, A. fib, recent diagnosis of PE, likely metastatic CA with spiculated pulmonary nodules presented with abdominal pain. Patient reported pain 10/10 in upper quadrants, nausea and vomiting.  Patient also reported abdominal swelling, yellowing of his eyes and under the tongue.  Patient was recently admitted (04/20/2019-04/22/2019) for chest pain and was found to have PE for which he was started on Xarelto.  Patient was also found to have hilar and mediastinal lymphadenopathy with possible liver mets, spiculated pulmonary nodules during previous admission, he was scheduled for EBUS and bronchoscopy, declined further work-up inpatient and was discharged at his request.  He is requesting evaluation for hospice and palliative care. In ED, patient was found to have elevated lipase, 2207, elevated AST ALT alk phos 569 with leukocytosis. Abdominal ultrasound showed normal gallbladder, biliary duct dilatation with choledocholithiasis, recommended ERCP. Patient admitted for further management.    Today, patient denies any new complaints, denies any worsening abdominal pain, chest pain, worsening shortness of breath, fever/chills.  Patient scheduled to have EBUS at [facility] on 04/28/2018    Assessment/Plan: Principal Problem:   Pulmonary embolism ([place]) Active Problems:   Lung mass   Essential hypertension   Elevated LFTs   ARF (acute renal failure) (HCC)   Acute pancreatitis likely 2/2 choledocholithiasis, acute transaminitis Currently afebrile, with leukocytosis Lipase trending down, 1879-->206 LFTs slowly trending down BC x2 NGTD GI on board, s/p ERCP with sphincterotomy, with CBD stone extraction on 3/16 by GI Dr. Henrene Pastor Continue IV Zosyn  Daily CMP  Pulmonary embolism Likely due to possible underlying malignancy Currently on  heparin drip, Xarelto on hold due to planned procedures, resume after procedure  Hilar and mediastinal lymphadenopathy, spiculated pulmonary nodules Seen on CT during previous admission, pulmonology was consulted For EBUS on 3/17, transferred to [facility] PCCM on board  Hyponatremia Likely due to poor p.o. intake, pseudohyponatremia secondary to hyperglycemia, possible SIADH in the setting of cancer Continue IV fluids for now, monitor Daily CMP  AKI on CKD stage II Improved Creatinine peaked at 1.55, baseline around 1.1 Hold losartan, continue IV hydration Daily CMP  Essential hypertension BP currently soft Continue Coreg Hold losartan, amlodipine  Diabetes mellitus type 2, NIDDM Hold Amaryl, continue sliding scale insulin  Tremor Continue primidone, hold if any worsening effects  Obesity Lifestyle modification advised  [place] Palliative/hospice team consulted     Body mass index is 31.45 kg/m.    Code Status: DNR DVT Prophylaxis: home meds Xarelto held , on heparin drip due to needing procedures   Family Communication: Discussed extensively with patient  Disposition Plan:    Patient came from:         home                                                                                                 Anticipated d/c place:  Likely home  Barriers to d/c OR conditions which need to be met to effect a safe d/c:  Need  bronchoscopy, palliative care consult   Consultants:  Pulmonary critical care  GI  Palliative care  Procedures:  ERCP on March 16  Antibiotics:  Zosyn   Objective: BP (!) 122/57 (BP Location: Left Arm)   Pulse 69   Temp 98.5 F (36.9 C) (Oral)   Resp 20   Ht '5\' 9"'$  (1.753 m)   Wt 96.6 kg   SpO2 95%   BMI 31.45 kg/m  No intake or output data in the 24 hours ending 04/28/19 1601 Filed Weights   04/20/19 2131  Weight: 96.6 kg    Exam:  General: NAD   Cardiovascular: S1, S2 present  Respiratory: CTAB  Abdomen:  Soft, nontender, nondistended, bowel sounds present  Musculoskeletal: No bilateral pedal edema noted  Skin: Normal  Psychiatry: Normal mood   Data Reviewed: Basic Metabolic Panel: Recent Labs  Lab 04/25/19 1721 04/26/19 0532 04/27/19 0201 04/27/19 1824 04/28/19 0109  NA 130* 128* 129* 126* 128*  K 3.8 4.9 4.4 4.9 4.8  CL 96* 98 100 99 102  CO2 22 20* 20* 20* 19*  GLUCOSE 184* 232* 124* 301* 282*  BUN 15 23 27* 25* 27*  CREATININE 1.10 1.55* 1.22 1.11 1.05  CALCIUM 8.7* 8.0* 8.0* 8.2* 8.1*   Liver Function Tests: Recent Labs  Lab 04/25/19 1721 04/26/19 0532 04/27/19 0201 04/27/19 1824 04/28/19 0109  AST 287* 506* 372* 261* 176*  ALT 200* 298* 263* 236* 196*  ALKPHOS 569* 551* 522* 625* 550*  BILITOT 4.3* 6.4* 5.9* 4.1* 2.9*  PROT 6.7 6.0* 5.9* 6.1* 5.5*  ALBUMIN 2.6* 2.3* 2.3* 2.2* 2.0*   Recent Labs  Lab 04/25/19 1721 04/26/19 0532 04/27/19 0201 04/28/19 0109  LIPASE 2,207* 1,879* 1,537* 206*   No results for input(s): AMMONIA in the last 168 hours. CBC: Recent Labs  Lab 04/25/19 1721 04/26/19 0532 04/27/19 0201 04/27/19 1824 04/28/19 0109  WBC 15.4* 26.5* 15.3* 10.6* 12.2*  HGB 14.2 12.2* 11.7* 12.1* 11.3*  HCT 43.0 37.3* 36.0* 37.5* 34.1*  MCV 92.1 94.2 94.2 94.0 93.7  PLT 302 288 262 295 304   Cardiac Enzymes:   No results for input(s): CKTOTAL, CKMB, CKMBINDEX, TROPONINI in the last 168 hours. BNP (last 3 results) Recent Labs    04/25/19 1730  BNP 128.3*    ProBNP (last 3 results) No results for input(s): PROBNP in the last 8760 hours.  CBG: Recent Labs  Lab 04/28/19 0009 04/28/19 0415 04/28/19 0737 04/28/19 1210 04/28/19 1328  GLUCAP 261* 286* 183* 144* 145*    Recent Results (from the past 240 hour(s))  SARS CORONAVIRUS 2 (TAT 6-24 HRS) Nasopharyngeal Nasopharyngeal Swab     Status: None   Collection Time: 04/21/19  7:08 AM   Specimen: Nasopharyngeal Swab  Result Value Ref Range Status   SARS Coronavirus 2 NEGATIVE  NEGATIVE Final    Comment: (NOTE) SARS-CoV-2 target nucleic acids are NOT DETECTED. The SARS-CoV-2 RNA is generally detectable in upper and lower respiratory specimens during the acute phase of infection. Negative results do not preclude SARS-CoV-2 infection, do not rule out co-infections with other pathogens, and should not be used as the sole basis for treatment or other patient management decisions. Negative results must be combined with clinical observations, patient history, and epidemiological information. The expected result is Negative. Fact Sheet for Patients: SugarRoll.be Fact Sheet for Healthcare Providers: https://www.woods-mathews.com/ This test is not yet approved or cleared by the [place] FDA and  has been authorized for detection and/or diagnosis of SARS-CoV-2 by FDA under  an Emergency Use Authorization (EUA). This EUA will remain  in effect (meaning this test can be used) for the duration of the COVID-19 declaration under Section 56 4(b)(1) of the Act, 21 U.S.C. section 360bbb-3(b)(1), unless the authorization is terminated or revoked sooner. Performed at [facility], [place] [address] [place], [place] [address]   SARS CORONAVIRUS 2 (TAT 6-24 HRS) Nasopharyngeal Nasopharyngeal Swab     Status: None   Collection Time: 04/25/19  7:59 PM   Specimen: Nasopharyngeal Swab  Result Value Ref Range Status   SARS Coronavirus 2 NEGATIVE NEGATIVE Final    Comment: (NOTE) SARS-CoV-2 target nucleic acids are NOT DETECTED. The SARS-CoV-2 RNA is generally detectable in upper and lower respiratory specimens during the acute phase of infection. Negative results do not preclude SARS-CoV-2 infection, do not rule out co-infections with other pathogens, and should not be used as the sole basis for treatment or other patient management decisions. Negative results must be combined with clinical observations, patient history, and  epidemiological information. The expected result is Negative. Fact Sheet for Patients: SugarRoll.be Fact Sheet for Healthcare Providers: https://www.woods-mathews.com/ This test is not yet approved or cleared by the [place] FDA and  has been authorized for detection and/or diagnosis of SARS-CoV-2 by FDA under an Emergency Use Authorization (EUA). This EUA will remain  in effect (meaning this test can be used) for the duration of the COVID-19 declaration under Section 56 4(b)(1) of the Act, 21 U.S.C. section 360bbb-3(b)(1), unless the authorization is terminated or revoked sooner. Performed at [facility], [place] [address], [place], [place] [address]   Culture, blood (Routine X 2) w Reflex to ID Panel     Status: None (Preliminary result)   Collection Time: 04/26/19  1:02 PM   Specimen: BLOOD  Result Value Ref Range Status   Specimen Description   Final    BLOOD RIGHT ANTECUBITAL Performed at [facility] [address], [place], [place] [address]    Special Requests   Final    BOTTLES DRAWN AEROBIC AND ANAEROBIC Blood Culture adequate volume Performed at [facility] [address], [place], [place] [address]    Culture   Final    NO GROWTH 2 DAYS Performed at [facility] [address] [place], [place] [address]    Report Status PENDING  Incomplete  Culture, blood (Routine X 2) w Reflex to ID Panel     Status: Abnormal (Preliminary result)   Collection Time: 04/26/19  1:02 PM   Specimen: BLOOD RIGHT HAND  Result Value Ref Range Status   Specimen Description   Final    BLOOD RIGHT HAND Performed at [facility] [address], [place], [place] [address]    Special Requests   Final    AEROBIC BOTTLE ONLY Blood Culture results may not be optimal due to an inadequate volume of blood received in culture bottles   Culture  Setup Time   Final    AEROBIC BOTTLE  ONLY GRAM POSITIVE COCCI IN CLUSTERS CRITICAL RESULT CALLED TO, READ BACK BY AND VERIFIED WITH: L POINDEXTER PHARMD 04/27/19 1810 JDW    Culture (A)  Final    STAPHYLOCOCCUS SPECIES (COAGULASE NEGATIVE) THE SIGNIFICANCE OF ISOLATING THIS ORGANISM FROM A SINGLE SET OF BLOOD CULTURES WHEN MULTIPLE SETS ARE DRAWN IS UNCERTAIN. PLEASE NOTIFY THE MICROBIOLOGY DEPARTMENT WITHIN ONE WEEK IF SPECIATION AND SENSITIVITIES ARE REQUIRED. Performed at [facility], [place] [address],  [place], [place] [address]    Report Status PENDING  Incomplete     Studies: No results found.   Alma Friendly MD [facility]    04/28/2019, 4:01 PM  LOS: 1 day

## 2019-04-28 NOTE — Progress Notes (Addendum)
RPh confirmed timing of heparin start with CCM - will resume heparin at 1400 units/hr at 0700 tomorrow; will need to f/u with CCM MD at 0700 to confirm  Reuel Boom, PharmD, BCPS [phone] 04/28/2019, 8:16 PM

## 2019-04-28 NOTE — Anesthesia Postprocedure Evaluation (Signed)
Anesthesia Post Note  Patient: Dennis Stephens  Procedure(s) Performed: VIDEO BRONCHOSCOPY WITH ENDOBRONCHIAL NAVIGATION (N/A ) ENDOBRONCHIAL ULTRASOUND (N/A ) BRONCHIAL BRUSHINGS BRONCHIAL BIOPSIES BRONCHIAL NEEDLE ASPIRATION BIOPSIES BRONCHIAL WASHINGS     Patient location during evaluation: PACU Anesthesia Type: General Level of consciousness: awake Pain management: pain level controlled Vital Signs Assessment: post-procedure vital signs reviewed and stable Respiratory status: spontaneous breathing Cardiovascular status: stable Postop Assessment: no apparent nausea or vomiting Anesthetic complications: no    Last Vitals:  Vitals:   04/28/19 1410 04/28/19 1633  BP: (!) 194/76 (!) 152/63  Pulse:  71  Resp:  (!) 23  Temp:    SpO2:  94%    Last Pain:  Vitals:   04/28/19 1331  TempSrc: Oral  PainSc:                  Atha Muradyan

## 2019-04-28 NOTE — Anesthesia Procedure Notes (Signed)
Procedure Name: Intubation Date/Time: 04/28/2019 3:08 PM Performed by: Eligha Bridegroom, CRNA Pre-anesthesia Checklist: Patient identified, Emergency Drugs available, Suction available, Patient being monitored and Timeout performed Patient Re-evaluated:Patient Re-evaluated prior to induction Oxygen Delivery Method: Circle system utilized Preoxygenation: Pre-oxygenation with 100% oxygen Induction Type: IV induction Ventilation: Mask ventilation without difficulty and Oral airway inserted - appropriate to patient size Laryngoscope Size: Mac and 4 Grade View: Grade I Tube type: Oral Tube size: 8.5 mm Number of attempts: 1 Airway Equipment and Method: Stylet Secured at: 22 cm Tube secured with: Tape Dental Injury: Teeth and Oropharynx as per pre-operative assessment

## 2019-04-28 NOTE — Progress Notes (Signed)
[facility] here to transport patient to [facility] for procedure.

## 2019-04-28 NOTE — Interval H&P Note (Signed)
History and Physical Interval Note:  04/28/2019 2:51 PM  Dennis Stephens  has presented today for surgery, with the diagnosis of Lung Cancer.  The various methods of treatment have been discussed with the patient and family. After consideration of risks, benefits and other options for treatment, the patient has consented to  Procedure(s): [place] (N/A) ENDOBRONCHIAL ULTRASOUND (N/A) as a surgical intervention.  The patient's history has been reviewed, patient examined, no change in status, stable for surgery.  I have reviewed the patient's chart and labs.  Questions were answered to the patient's satisfaction.    Heparin held since 6am. Discussed risks and benefits of proceeding. Patient with no barriers to proceed.   [place]

## 2019-04-28 NOTE — Progress Notes (Signed)
[facility] for IV heparin Indication: Recent PE, Xarelto on hold  No Known Allergies  Patient Measurements: Height: 5\' 9"  (175.3 cm) Weight: 211 lb 10.3 oz (96 kg) IBW/kg (Calculated) : 70.7 Heparin Dosing Weight: 91 kg  Vital Signs: Temp: 97.7 F (36.5 C) (03/16 2008) Temp Source: Oral (03/16 2008) BP: 181/80 (03/16 2008) Pulse Rate: 76 (03/16 2008)  Labs: Recent Labs    04/25/19 1721 04/25/19 1730 04/25/19 1929 04/26/19 0532 04/26/19 0532 04/26/19 1429 04/27/19 0201 04/27/19 0201 04/27/19 1824 04/28/19 0109  HGB   < >  --   --  12.2*   < >  --  11.7*   < > 12.1* 11.3*  HCT   < >  --   --  37.3*   < >  --  36.0*  --  37.5* 34.1*  PLT   < >  --   --  288   < >  --  262  --  295 304  APTT  --   --   --   --    < > 38* 77*  --  46* 56*  LABPROT  --   --   --  15.4*  --   --   --   --  15.2  --   INR  --   --   --  1.2  --   --   --   --  1.2  --   HEPARINUNFRC  --   --   --   --   --  1.72* 1.24*  --   --  0.28*  CREATININE   < >  --   --  1.55*   < >  --  1.22  --  1.11 1.05  TROPONINIHS  --  8 8  --   --   --   --   --   --   --    < > = values in this interval not displayed.    Estimated Creatinine Clearance: 60.9 mL/min (by C-G formula based on SCr of 1.05 mg/dL).   Medical History: Past Medical History:  Diagnosis Date  . A-fib ([place])   . DM2 (diabetes mellitus, type 2) ([place])   . Hypertension    Assessment: Pt is a 35 yoM with PMH significant for afib, recently diagnosed PE, recently diagnosed likely metastatic cancer with pulmonary nodules.   Pt was recently admitted from 04/20/19 - 04/22/19 at which time he was diagnosed with bilateral PE and prescribed rivaroxaban. Rivaroxaban 15 mg PO BID initiated on 3/11, last dose was given at 0815 on 04/26/19. Anticoagulation being converted to heparin drip at this time for anticipated procedures. Baseline heparin level falsely elevated due to recent DOAC.   Significant  Events: -3/16: patient underwent ERCP, heparin resumed 6 hours post-procedure  Today, 04/28/19:   Heparin level = 0.28 units/mL, slightly subtherapeutic  APTT = 56 seconds, also subtherapeutic  CBC: Hgb 11.3, Pltc WNL   SCr improved to WNL  No bleeding or infusion issues noted per nursing   Goal of Therapy:  APTT 66-102 seconds Heparin level 0.3-0.7 units/ml Monitor platelets by anticoagulation protocol: Yes   Plan:   Increase heparin infusion to 1400 units/hr  Heparin infusion to be stopped at 0600 today per pulmonology for bronch.  F/u for resumption of anticoagulation post-procedure.   Can use heparin levels moving forward for heparin dosing/titration, as heparin level and aPTT now correlating.   Lindell Spar, PharmD,  BCPS Clinical Pharmacist  04/28/2019,3:32 AM

## 2019-04-28 NOTE — Progress Notes (Signed)
RN spoke to DR. Ogan on call pulmonary about patient returning from bronchoscopy and no orders. Verbal order to restart Heparin in the morning 3/18, to restart NS @ 75 and patient can have clear liquids. Orders entered. Pt Alert and c/o of cough only. Slight wheezing on 2L. No other c/o will continue to monitor.

## 2019-04-28 NOTE — Progress Notes (Signed)
  Progress Note   Subjective  Chief Complaint: Acute pancreatitis, CBD stone status post ERCP 04/27/2019  Today, the patient reports that he is feeling well as far as his stomach goes, no nausea or vomiting.  He is very hungry but has been on only sips of water this morning with plans for bronchoscopy later today.  He has not had a bowel movement for at least a day but denies any abdominal pain.  Patient is concerned in regards to his lung cancer because he has had 2 wives die from the same thing.   Objective   Vital signs in last 24 hours: Temp:  [97.7 F (36.5 C)-98 F (36.7 C)] 97.8 F (36.6 C) (03/17 0414) Pulse Rate:  [60-79] 70 (03/17 0414) Resp:  [13-27] 16 (03/17 0414) BP: (130-181)/(56-86) 173/76 (03/17 0414) SpO2:  [94 %-100 %] 99 % (03/17 0414) Last BM Date: 04/25/19 General:    white male in NAD Heart:  Regular rate and rhythm; no murmurs Lungs: Respirations even and unlabored, lungs CTA bilaterally Abdomen:  Soft, nontender and nondistended. Normal bowel sounds. Extremities:  Without edema. Neurologic:  Alert and oriented,  grossly normal neurologically. Psych:  Cooperative. Normal mood and affect.  Intake/Output from previous day: 03/16 0701 - 03/17 0700 In: 1804.7 [P.O.:240; I.V.:1386; IV Piggyback:178.7] Out: 1375 [Urine:1375] Intake/Output this shift: Total I/O In: -  Out: 250 [Urine:250]  Lab Results: Recent Labs    04/27/19 0201 04/27/19 1824 04/28/19 0109  WBC 15.3* 10.6* 12.2*  HGB 11.7* 12.1* 11.3*  HCT 36.0* 37.5* 34.1*  PLT 262 295 304   BMET Recent Labs    04/27/19 0201 04/27/19 1824 04/28/19 0109  NA 129* 126* 128*  K 4.4 4.9 4.8  CL 100 99 102  CO2 20* 20* 19*  GLUCOSE 124* 301* 282*  BUN 27* 25* 27*  CREATININE 1.22 1.11 1.05  CALCIUM 8.0* 8.2* 8.1*   Hepatic Function Latest Ref Rng & Units 04/28/2019 04/27/2019 04/27/2019  Total Protein 6.5 - 8.1 g/dL 5.5(L) 6.1(L) 5.9(L)  Albumin 3.5 - 5.0 g/dL 2.0(L) 2.2(L) 2.3(L)  AST  15 - 41 U/L 176(H) 261(H) 372(H)  ALT 0 - 44 U/L 196(H) 236(H) 263(H)  Alk Phosphatase 38 - 126 U/L 550(H) 625(H) 522(H)  Total Bilirubin 0.3 - 1.2 mg/dL 2.9(H) 4.1(H) 5.9(H)  Bilirubin, Direct 0.0 - 0.2 mg/dL - - -    PT/INR Recent Labs    04/26/19 0532 04/27/19 1824  LABPROT 15.4* 15.2  INR 1.2 1.2    Studies/Results: MR 3D Recon At Scanner  Result Date: 04/26/2019 CLINICAL DATA:  Evaluate for choledocholithiasis. EXAM: MRI ABDOMEN WITHOUT AND WITH CONTRAST (INCLUDING MRCP) TECHNIQUE: Multiplanar multisequence MR imaging of the abdomen was performed both before and after the administration of intravenous contrast. Heavily T2-weighted images of the biliary and pancreatic ducts were obtained, and three-dimensional MRCP images were rendered by post processing. CONTRAST:  10mL GADAVIST GADOBUTROL 1 MMOL/ML IV SOLN COMPARISON:  CT AP 04/20/2019 FINDINGS: Lower chest: Trace bilateral pleural effusions. Hepatobiliary: Motion artifact diminishes exam detail within the liver. Corresponding to the CT abnormality is a focal area of increased T2 signal with restricted diffusion within segment 8/4. This measures 1.5 cm. A second more subtle area of increased T2 signal is noted within segment 7, image 12/3 and image 57/5. Enhancement characteristics of both of these lesions are difficult to assess due motion artifact. There is mild diffuse gallbladder wall thickening. Sludge is identified within the neck of the gallbladder. The common bile duct   measures 1.1 cm in diameter. At least 4 stones are identified within the distal common bile duct which measure up to 7 mm, image 18/8. Pancreas: Mild diffuse edema is identified. No main duct dilatation or mass identified. No pancreatic necrosis or pseudocyst formation. Spleen:  Within normal limits in size and appearance. Adrenals/Urinary Tract:  Normal appearance of the adrenal glands. No hydronephrosis identified bilaterally. Bilateral perinephric fat stranding is  identified, nonspecific in may be related to prior insult. Several T2 hyperintense kidney lesions are identified. The largest arises from the posterior cortex of the upper pole of left kidney measuring 4.5 cm and is compatible with a simple cyst. No solid enhancing mass or hydronephrosis identified bilaterally. No abnormal signal in or enhancement noted in the region of the recently characterized 10 mm right mid pole lesion. Stomach/Bowel: Visualized portions within the abdomen are unremarkable. Vascular/Lymphatic: Aortic atherosclerosis. No aneurysm. The portal vein remains patent. The splenic vein is also patent. No adenopathy Other:  No free fluid or fluid collections. Musculoskeletal: No suspicious bone lesions identified. IMPRESSION: 1. Exam is positive for choledocholithiasis. At least 4 stones are noted within the distal measuring up to 7 mm. There is increase caliber of the CBD which measures 1.1 cm in maximum dimension. 2. Gallbladder sludge and mild diffuse gallbladder wall thickening. Cannot rule out cholecystitis 3. Two areas of abnormal increased T2 signal are identified within segment 8/4 and segment 7. The enhancement characteristics of these lesions are difficult to assess due to abnormal motion artifact on the postcontrast images. As mentioned on exam from 04/20/2019 these are new when compared with 01/20/2019. Metastatic disease cannot be excluded. 4. Mild diffuse pancreatic edema compatible with uncomplicated acute pancreatitis. No pancreatic necrosis, main duct dilatation or pseudocyst identified at this time. 5. No MRI correlate to the suspicious right kidney lesion identified on recent CT. This remains indeterminate and may not be seen due to motion artifact on today's study. Recommend follow-up imaging after resolution of this acute episode. Ideally this should be performed as an outpatient when the patient is able to lay motionless and breath hold. 6. These results will be called to the  ordering clinician or representative by the Radiologist Assistant, and communication documented in the PACS or [employer]. Electronically Signed   By: Kerby Moors M.D.   On: 04/26/2019 19:40   DG ERCP BILIARY & PANCREATIC DUCTS  Result Date: 04/27/2019 CLINICAL DATA:  ERCP with sphincterotomy and balloon extraction. EXAM: ERCP TECHNIQUE: Multiple spot images obtained with the fluoroscopic device and submitted for interpretation post-procedure. COMPARISON:  MRCP - 04/26/2019 FLUOROSCOPY TIME:  4 minutes, 28 seconds FINDINGS: Twenty spot intraoperative fluoroscopic images of the right upper abdominal quadrant during ERCP are provided for review Initial image demonstrates an ERCP probe overlying the right upper abdominal quadrant Subsequent images demonstrate selective cannulation and opacification of the common bile duct which appears moderately dilated. There are 4 nonocclusive filling defects within the distal aspect of the CBD (image 4) compatible with choledocholithiasis demonstrated on preceding MRCP. Subsequent images demonstrate insufflation of a balloon within the central/mid aspect of the CBD with subsequent biliary sweeping and presumed sphincterotomy. There is minimal opacification of the intrahepatic biliary tree which appears nondilated. There is no definitive opacification of either the cystic or pancreatic ducts. IMPRESSION: ERCP with findings of choledocholithiasis with subsequent biliary sweeping and sphincterotomy. These images were submitted for radiologic interpretation only. Please see the procedural report for the amount of contrast and the fluoroscopy time utilized. Electronically Signed  By: John  Watts M.D.   On: 04/27/2019 11:32   MR ABDOMEN MRCP W WO CONTAST  Result Date: 04/26/2019 CLINICAL DATA:  Evaluate for choledocholithiasis. EXAM: MRI ABDOMEN WITHOUT AND WITH CONTRAST (INCLUDING MRCP) TECHNIQUE: Multiplanar multisequence MR imaging of the abdomen was performed both  before and after the administration of intravenous contrast. Heavily T2-weighted images of the biliary and pancreatic ducts were obtained, and three-dimensional MRCP images were rendered by post processing. CONTRAST:  10mL GADAVIST GADOBUTROL 1 MMOL/ML IV SOLN COMPARISON:  CT AP 04/20/2019 FINDINGS: Lower chest: Trace bilateral pleural effusions. Hepatobiliary: Motion artifact diminishes exam detail within the liver. Corresponding to the CT abnormality is a focal area of increased T2 signal with restricted diffusion within segment 8/4. This measures 1.5 cm. A second more subtle area of increased T2 signal is noted within segment 7, image 12/3 and image 57/5. Enhancement characteristics of both of these lesions are difficult to assess due motion artifact. There is mild diffuse gallbladder wall thickening. Sludge is identified within the neck of the gallbladder. The common bile duct measures 1.1 cm in diameter. At least 4 stones are identified within the distal common bile duct which measure up to 7 mm, image 18/8. Pancreas: Mild diffuse edema is identified. No main duct dilatation or mass identified. No pancreatic necrosis or pseudocyst formation. Spleen:  Within normal limits in size and appearance. Adrenals/Urinary Tract:  Normal appearance of the adrenal glands. No hydronephrosis identified bilaterally. Bilateral perinephric fat stranding is identified, nonspecific in may be related to prior insult. Several T2 hyperintense kidney lesions are identified. The largest arises from the posterior cortex of the upper pole of left kidney measuring 4.5 cm and is compatible with a simple cyst. No solid enhancing mass or hydronephrosis identified bilaterally. No abnormal signal in or enhancement noted in the region of the recently characterized 10 mm right mid pole lesion. Stomach/Bowel: Visualized portions within the abdomen are unremarkable. Vascular/Lymphatic: Aortic atherosclerosis. No aneurysm. The portal vein remains  patent. The splenic vein is also patent. No adenopathy Other:  No free fluid or fluid collections. Musculoskeletal: No suspicious bone lesions identified. IMPRESSION: 1. Exam is positive for choledocholithiasis. At least 4 stones are noted within the distal measuring up to 7 mm. There is increase caliber of the CBD which measures 1.1 cm in maximum dimension. 2. Gallbladder sludge and mild diffuse gallbladder wall thickening. Cannot rule out cholecystitis 3. Two areas of abnormal increased T2 signal are identified within segment 8/4 and segment 7. The enhancement characteristics of these lesions are difficult to assess due to abnormal motion artifact on the postcontrast images. As mentioned on exam from 04/20/2019 these are new when compared with 01/20/2019. Metastatic disease cannot be excluded. 4. Mild diffuse pancreatic edema compatible with uncomplicated acute pancreatitis. No pancreatic necrosis, main duct dilatation or pseudocyst identified at this time. 5. No MRI correlate to the suspicious right kidney lesion identified on recent CT. This remains indeterminate and may not be seen due to motion artifact on today's study. Recommend follow-up imaging after resolution of this acute episode. Ideally this should be performed as an outpatient when the patient is able to lay motionless and breath hold. 6. These results will be called to the ordering clinician or representative by the Radiologist Assistant, and communication documented in the PACS or [facility]. Electronically Signed   By: Taylor  Stroud M.D.   On: 04/26/2019 19:40   ERCP 04/27/2019 Impression:               -   The entire main bile duct was moderately dilated,                            with a stones causing an obstruction.                           - Choledocholithiasis was found with somewhat                            purulent bile. Complete removal was accomplished by                            balloon extraction.                            - A biliary sphincterotomy was performed.                           - The biliary tree was swept times with no residual                            filling defects and excellent drainage.  Recommendation:           - Clear liquid diet.                           - Resume heparin at prior dose today.                           -Continue antibiotics                           -Inpatient GI team to continue to follow    Assessment / Plan:   Assessment: 1.  Acute pancreatitis: Thought due to choledocholithiasis, now improving with minimal pain after ERCP 04/27/2019 2.  Choledocholithiasis: 04/27/2019 ERCP with successful stone extraction and sphincterotomy 3.  Bilateral spiculated lung mass: Plans for bronchoscopy later today 4.  New bilateral pulmonary emboli 5.  A. fib on chronic anticoagulation: Last dose of Xarelto 04/26/2019  Plan: 1.  Patient's liver enzymes are trending down and he is in no further pain 2.  Diet per critical care team who plans for bronchoscopy later today, okay to advance to clears and then as tolerated per GI perspective 3.  We will likely sign off today.  Please await any further recommendations from Dr. Pyrtle.  Thank you for your kind consultation.   LOS: 3 days   Jennifer Lynne Lemmon  04/28/2019, 9:43 AM   

## 2019-04-28 NOTE — Progress Notes (Signed)
Dr. Nyoka Cowden notified that patient arrived to short stay from [facility] hypertensive.  No new orders received.  Will continue to monitor patient.

## 2019-04-28 NOTE — Progress Notes (Signed)
Patient returns from [facility] at this time

## 2019-04-28 NOTE — Op Note (Signed)
Video Bronchoscopy with Electromagnetic Navigation Procedure Note Video Bronchoscopy with Endobronchial Ultrasound Procedure Note  Date of Operation: 04/28/2019  Pre-op Diagnosis: Lung mass, mediastinal adenopathy  Post-op Diagnosis: Lung mass, mediastinal adenopathy  Surgeon: Garner Nash, DO  Assistants: None   Anesthesia: General endotracheal anesthesia  Operation: Flexible video fiberoptic bronchoscopy with electromagnetic navigation and biopsies.  Estimated Blood Loss: ~30ZS  Complications: LUL bleeding   Indications and History: Dennis Stephens is a 86 y.o. male with left upper lobe lung mass, mediastinal adenopathy.  The risks, benefits, complications, treatment options and expected outcomes were discussed with the patient.  The possibilities of pneumothorax, pneumonia, reaction to medication, pulmonary aspiration, perforation of a viscus, bleeding, failure to diagnose a condition and creating a complication requiring transfusion or operation were discussed with the patient who freely signed the consent.    Description of Procedure: The patient was seen in the Preoperative Area, was examined and was deemed appropriate to proceed.  The patient was taken to endoscopy [facility] room 2, identified as Dennis Stephens and the procedure verified as Flexible Video Fiberoptic Bronchoscopy.  A Time Out was held and the above information confirmed.   Prior to the date of the procedure a high-resolution CT scan of the chest was performed. Utilizing [place] a virtual tracheobronchial tree was generated to allow the creation of distinct navigation pathways to the patient's parenchymal abnormalities. After being taken to the operating room general anesthesia was initiated and the patient  was orally intubated. The video fiberoptic bronchoscope was introduced via the endotracheal tube and a general inspection was performed which showed normal right and left lung anatomy, no  evidence of endobronchial lesion. The extendable working channel and locator guide were introduced into the bronchoscope. The distinct navigation pathways prepared prior to this procedure were then utilized to navigate to within 0.8 cm of patient's lesion(s) identified on CT scan within the left upper lobe.  A full fluoroscopic sweep was completed between RAO 25 to LAO 25 degrees.  This was completed with inspiratory breath-hold APL 25. The extendable working channel was secured into place and the locator guide was withdrawn. Under fluoroscopic guidance transbronchial needle brushings, transbronchial Wang needle biopsies, and transbronchial forceps biopsies were performed to be sent for cytology and pathology. A bronchioalveolar lavage was performed in the left upper lobe and sent for cytology.  Only one transbronchial biopsy was obtained.  Following this transbronchial biopsy there was significant bleeding from the left upper lobe.  Immediately approximately 2-1/2 to 3 cc of blood filled up to the left upper lobe and left lower lobe.  This was allowed to clot and suctioned free.  Blood continued to ooze from the left upper lobe.  Approximately 10 cc of blood loss occurred to the left upper lobe.  1: 10,000 dilution of epinephrine was sprayed into the left upper lobe 2 cc at a time.  This was completed twice.  Ice saline was instilled into the left upper lobe and the bronchoscope was wedged into the bleeding segment.  Following switching out the scopes to a EBUS bronchoscope with the balloon on the end.  The EBUS balloon was wedged into the left upper lobe and inflated and allowed to rest for approximately 3 minutes.  There was effective hemostasis and clear fibrin/clot formation.  The standard scope was then withdrawn and the endobronchial ultrasound was used to identify and characterize the peritracheal, hilar and bronchial lymph nodes. Inspection showed enlarged 4R. Using real-time ultrasound guidance Wang  needle biopsies  were take from [place] and were sent for cytology. The patient tolerated the procedure well without apparent complications. There was no significant blood loss.  At the conclusion of the procedure the bronchoscope was used for therapeutic suctioning of blood clots and secretions from bilateral mainstem's and lower lobes.  There was a adherent clot to the left upper lobe segment which was left intact.  There was no evidence of active bleeding.  The scope was returned to just above the main carina for observation of bleeding.  There was no evidence of active bleeding.  The bronchoscope was withdrawn. Anesthesia was reversed and the patient was taken to the PACU for recovery.    Samples: 1. Transbronchial needle brushings from left upper lobe 2. Transbronchial Wang needle biopsies from left upper lobe 3. Transbronchial forceps biopsies from left upper lobe x1, no additional specimens due to bleeding from left upper lobe 4. Bronchoalveolar lavage from left upper lobe 5.  Transbronchial needle aspiration biopsy station 4R  Plans:  The patient will be discharged from the PACU to home when recovered from anesthesia and after chest x-ray is reviewed. We will review the cytology, pathology and microbiology results with the patient when they become available. Outpatient followup will be with Garner Nash, DO.    Garner Nash, DO [facility] 04/28/2019 4:54 PM

## 2019-04-28 NOTE — Care Management Important Message (Signed)
Important Message  Patient Details IM Letter given to Roque Lias SW Case Manager to present to the Patient Name: Dennis Stephens MRN: [id number] Date of Birth: [birth year]   Medicare Important Message Given:  Yes     Kerin Salen 04/28/2019, 10:07 AM

## 2019-04-28 NOTE — Transfer of Care (Signed)
22Immediate Anesthesia Transfer of Care Note  Patient: Dennis Stephens  Procedure(s) Performed: VIDEO BRONCHOSCOPY WITH ENDOBRONCHIAL NAVIGATION (N/A ) ENDOBRONCHIAL ULTRASOUND (N/A ) BRONCHIAL BRUSHINGS BRONCHIAL BIOPSIES BRONCHIAL NEEDLE ASPIRATION BIOPSIES BRONCHIAL WASHINGS  Patient Location: PACU  Anesthesia Type:General  Level of Consciousness: awake and alert   Airway & Oxygen Therapy: Patient Spontanous Breathing and Patient connected to face mask oxygen  Post-op Assessment: Report given to RN and Post -op Vital signs reviewed and stable  Post vital signs: Reviewed and stable  Last Vitals:  Vitals Value Taken Time  BP 152/63 04/28/19 1633  Temp    Pulse 72 04/28/19 1636  Resp 16 04/28/19 1636  SpO2 94 % 04/28/19 1636  Vitals shown include unvalidated device data.  Last Pain:  Vitals:   04/28/19 1331  TempSrc: Oral  PainSc:       Patients Stated Pain Goal: 2 (95/07/22 5750)  Complications: No apparent anesthesia complications

## 2019-04-28 NOTE — Anesthesia Preprocedure Evaluation (Signed)
Anesthesia Evaluation  Patient identified by MRN, date of birth, ID band Patient awake    Reviewed: Allergy & Precautions, NPO status , Patient's Chart, lab work & pertinent test results  Airway Mallampati: II  TM Distance: >3 FB     Dental   Pulmonary former smoker,    breath sounds clear to auscultation       Cardiovascular hypertension,  Rhythm:Regular Rate:Normal     Neuro/Psych    GI/Hepatic negative GI ROS, Neg liver ROS,   Endo/Other  diabetes  Renal/GU Renal disease     Musculoskeletal   Abdominal   Peds  Hematology   Anesthesia Other Findings   Reproductive/Obstetrics                             Anesthesia Physical Anesthesia Plan  ASA: III  Anesthesia Plan: General   Post-op Pain Management:    Induction: Intravenous  PONV Risk Score and Plan: 3 and Ondansetron, Dexamethasone and Midazolam  Airway Management Planned: Oral ETT  Additional Equipment:   Intra-op Plan:   Post-operative Plan: Possible Post-op intubation/ventilation  Informed Consent: I have reviewed the patients History and Physical, chart, labs and discussed the procedure including the risks, benefits and alternatives for the proposed anesthesia with the patient or authorized representative who has indicated his/her understanding and acceptance.     Dental advisory given  Plan Discussed with: Anesthesiologist and CRNA  Anesthesia Plan Comments:         Anesthesia Quick Evaluation

## 2019-04-28 NOTE — Progress Notes (Signed)
PROGRESS NOTE  Dennis Stephens ZOX:[id number] DOB: [birth year] DOA: 04/25/2019 PCP: [facility]  Brief summary: Patient is a 86 year old male with history of hypertension, A. fib, recent diagnosis of PE, likely metastatic CA with spiculated pulmonary nodules presented with abdominal pain. Patient reported pain 10/10 in upper quadrants, nausea and vomiting.  Patient also reported abdominal swelling, yellowing of his eyes and under the tongue.  Patient was recently admitted (04/20/2019-04/22/2019) for chest pain and was found to have PE for which he was started on Xarelto.  Patient was also found to have hilar and mediastinal lymphadenopathy with possible liver mets, spiculated pulmonary nodules during previous admission, he was scheduled for EBUS and bronchoscopy, declined further work-up inpatient and was discharged at his request.  He is requesting evaluation for hospice and palliative care. In ED, patient was found to have elevated lipase, 2207, elevated AST ALT alk phos 569 with leukocytosis. Abdominal ultrasound showed normal gallbladder, biliary duct dilatation with choledocholithiasis, recommended ERCP. Patient admitted for further management.    Today, patient denies any new complaints, denies any worsening abdominal pain, chest pain, worsening shortness of breath, fever/chills.  Patient scheduled to have EBUS at [facility] on 04/28/2018    Assessment/Plan: Principal Problem:   Acute pancreatitis Active Problems:   Pulmonary embolism (HCC)   Lung mass   Essential hypertension   Elevated LFTs   Bile duct stone   Acute pancreatitis likely 2/2 choledocholithiasis, acute transaminitis Currently afebrile, with leukocytosis Lipase trending down, 1879-->206 LFTs slowly trending down BC x2 NGTD GI on board, s/p ERCP with sphincterotomy, with CBD stone extraction on 3/16 by GI Dr. Henrene Pastor Continue IV Zosyn  Daily CMP  Pulmonary embolism Likely due to possible underlying  malignancy Currently on heparin drip, Xarelto on hold due to planned procedures, resume after procedure  Hilar and mediastinal lymphadenopathy, spiculated pulmonary nodules Seen on CT during previous admission, pulmonology was consulted For EBUS on 3/17, transferred to [facility] PCCM on board  Hyponatremia Likely due to poor p.o. intake, pseudohyponatremia secondary to hyperglycemia, possible SIADH in the setting of cancer Continue IV fluids for now, monitor Daily CMP  AKI on CKD stage II Improved Creatinine peaked at 1.55, baseline around 1.1 Hold losartan, continue IV hydration Daily CMP  Essential hypertension BP currently soft Continue Coreg Hold losartan, amlodipine  Diabetes mellitus type 2, NIDDM Hold Amaryl, continue sliding scale insulin  Tremor Continue primidone, hold if any worsening effects  Obesity Lifestyle modification advised  [place] Palliative/hospice team consulted     Body mass index is 30.27 kg/m.    Code Status: DNR DVT Prophylaxis: home meds Xarelto held , on heparin drip due to needing procedures   Family Communication: Discussed extensively with patient  Disposition Plan:    Patient came from:         home                                                                                                 Anticipated d/c place:  Likely home  Barriers to d/c OR conditions which need to be met to effect a safe d/c:  Need bronchoscopy, palliative care consult   Consultants:  Pulmonary critical care  GI  Palliative care  Procedures:  ERCP on March 16  Antibiotics:  Zosyn   Objective: BP (!) 194/76   Pulse 73   Temp 98 F (36.7 C) (Oral)   Resp 17   Ht '5\' 9"'$  (1.753 m)   Wt 93 kg   SpO2 98%   BMI 30.27 kg/m   Intake/Output Summary (Last 24 hours) at 04/28/2019 1620 Last data filed at 04/28/2019 0837 Gross per 24 hour  Intake 554.84 ml  Output 1625 ml  Net -1070.16 ml   Filed Weights   04/25/19 1718 04/27/19  0842 04/28/19 1331  Weight: 96 kg 96 kg 93 kg    Exam:  General: NAD   Cardiovascular: S1, S2 present  Respiratory: CTAB  Abdomen: Soft, nontender, nondistended, bowel sounds present  Musculoskeletal: No bilateral pedal edema noted  Skin: Normal  Psychiatry: Normal mood   Data Reviewed: Basic Metabolic Panel: Recent Labs  Lab 04/25/19 1721 04/26/19 0532 04/27/19 0201 04/27/19 1824 04/28/19 0109  NA 130* 128* 129* 126* 128*  K 3.8 4.9 4.4 4.9 4.8  CL 96* 98 100 99 102  CO2 22 20* 20* 20* 19*  GLUCOSE 184* 232* 124* 301* 282*  BUN 15 23 27* 25* 27*  CREATININE 1.10 1.55* 1.22 1.11 1.05  CALCIUM 8.7* 8.0* 8.0* 8.2* 8.1*   Liver Function Tests: Recent Labs  Lab 04/25/19 1721 04/26/19 0532 04/27/19 0201 04/27/19 1824 04/28/19 0109  AST 287* 506* 372* 261* 176*  ALT 200* 298* 263* 236* 196*  ALKPHOS 569* 551* 522* 625* 550*  BILITOT 4.3* 6.4* 5.9* 4.1* 2.9*  PROT 6.7 6.0* 5.9* 6.1* 5.5*  ALBUMIN 2.6* 2.3* 2.3* 2.2* 2.0*   Recent Labs  Lab 04/25/19 1721 04/26/19 0532 04/27/19 0201 04/28/19 0109  LIPASE 2,207* 1,879* 1,537* 206*   No results for input(s): AMMONIA in the last 168 hours. CBC: Recent Labs  Lab 04/25/19 1721 04/26/19 0532 04/27/19 0201 04/27/19 1824 04/28/19 0109  WBC 15.4* 26.5* 15.3* 10.6* 12.2*  HGB 14.2 12.2* 11.7* 12.1* 11.3*  HCT 43.0 37.3* 36.0* 37.5* 34.1*  MCV 92.1 94.2 94.2 94.0 93.7  PLT 302 288 262 295 304   Cardiac Enzymes:   No results for input(s): CKTOTAL, CKMB, CKMBINDEX, TROPONINI in the last 168 hours. BNP (last 3 results) Recent Labs    04/25/19 1730  BNP 128.3*    ProBNP (last 3 results) No results for input(s): PROBNP in the last 8760 hours.  CBG: Recent Labs  Lab 04/28/19 0009 04/28/19 0415 04/28/19 0737 04/28/19 1210 04/28/19 1328  GLUCAP 261* 286* 183* 144* 145*    Recent Results (from the past 240 hour(s))  SARS CORONAVIRUS 2 (TAT 6-24 HRS) Nasopharyngeal Nasopharyngeal Swab     Status:  None   Collection Time: 04/21/19  7:08 AM   Specimen: Nasopharyngeal Swab  Result Value Ref Range Status   SARS Coronavirus 2 NEGATIVE NEGATIVE Final    Comment: (NOTE) SARS-CoV-2 target nucleic acids are NOT DETECTED. The SARS-CoV-2 RNA is generally detectable in upper and lower respiratory specimens during the acute phase of infection. Negative results do not preclude SARS-CoV-2 infection, do not rule out co-infections with other pathogens, and should not be used as the sole basis for treatment or other patient management decisions. Negative results must be combined with clinical observations, patient history, and epidemiological information. The expected result is Negative. Fact Sheet for Patients: SugarRoll.be Fact Sheet for Healthcare Providers: https://www.woods-mathews.com/ This  test is not yet approved or cleared by the [place] and  has been authorized for detection and/or diagnosis of SARS-CoV-2 by FDA under an Emergency Use Authorization (EUA). This EUA will remain  in effect (meaning this test can be used) for the duration of the COVID-19 declaration under Section 56 4(b)(1) of the Act, 21 U.S.C. section 360bbb-3(b)(1), unless the authorization is terminated or revoked sooner. Performed at [facility], [place] [address], [place], [place] [address]   SARS CORONAVIRUS 2 (TAT 6-24 HRS) Nasopharyngeal Nasopharyngeal Swab     Status: None   Collection Time: 04/25/19  7:59 PM   Specimen: Nasopharyngeal Swab  Result Value Ref Range Status   SARS Coronavirus 2 NEGATIVE NEGATIVE Final    Comment: (NOTE) SARS-CoV-2 target nucleic acids are NOT DETECTED. The SARS-CoV-2 RNA is generally detectable in upper and lower respiratory specimens during the acute phase of infection. Negative results do not preclude SARS-CoV-2 infection, do not rule out co-infections with other pathogens, and should not be used as the sole basis for  treatment or other patient management decisions. Negative results must be combined with clinical observations, patient history, and epidemiological information. The expected result is Negative. Fact Sheet for Patients: SugarRoll.be Fact Sheet for Healthcare Providers: https://www.woods-mathews.com/ This test is not yet approved or cleared by the [place] FDA and  has been authorized for detection and/or diagnosis of SARS-CoV-2 by FDA under an Emergency Use Authorization (EUA). This EUA will remain  in effect (meaning this test can be used) for the duration of the COVID-19 declaration under Section 56 4(b)(1) of the Act, 21 U.S.C. section 360bbb-3(b)(1), unless the authorization is terminated or revoked sooner. Performed at [facility], [place] [address] [place], [place] [address]   Culture, blood (Routine X 2) w Reflex to ID Panel     Status: None (Preliminary result)   Collection Time: 04/26/19  1:02 PM   Specimen: BLOOD  Result Value Ref Range Status   Specimen Description   Final    BLOOD RIGHT ANTECUBITAL Performed at [facility] [address], [place], [place] [address]    Special Requests   Final    BOTTLES DRAWN AEROBIC AND ANAEROBIC Blood Culture adequate volume Performed at [facility] [address] [place], [place] [address]    Culture   Final    NO GROWTH 2 DAYS Performed at [facility] [address], [place], [place] [address]    Report Status PENDING  Incomplete  Culture, blood (Routine X 2) w Reflex to ID Panel     Status: Abnormal (Preliminary result)   Collection Time: 04/26/19  1:02 PM   Specimen: BLOOD RIGHT HAND  Result Value Ref Range Status   Specimen Description   Final    BLOOD RIGHT HAND Performed at [facility] [address], [place],  [address]    Special Requests   Final    AEROBIC BOTTLE ONLY Blood Culture results  may not be optimal due to an inadequate volume of blood received in culture bottles   Culture  Setup Time   Final    AEROBIC BOTTLE ONLY GRAM POSITIVE COCCI IN CLUSTERS CRITICAL RESULT CALLED TO, READ BACK BY AND VERIFIED WITH: L POINDEXTER PHARMD 04/27/19 1810 JDW    Culture (A)  Final    STAPHYLOCOCCUS SPECIES (COAGULASE NEGATIVE) THE SIGNIFICANCE OF ISOLATING THIS ORGANISM FROM A SINGLE SET OF BLOOD CULTURES WHEN MULTIPLE SETS ARE DRAWN  IS UNCERTAIN. PLEASE NOTIFY THE MICROBIOLOGY DEPARTMENT WITHIN ONE WEEK IF SPECIATION AND SENSITIVITIES ARE REQUIRED. Performed at [facility], [place] [address] [place], [place] [address]    Report Status PENDING  Incomplete     Studies: No results found.   Alma Friendly MD [facility]    04/28/2019, 4:20 PM  LOS: 3 days

## 2019-04-28 NOTE — Significant Event (Signed)
PT had a bronchoscopy earlier today. There was some intra-operative hemorrhage per procedure note. Pt was not started back on Heparin infusion for PE and atrial fibrillation post-operatively. Clinical pharmacist was asking when to resume Heparin infusion. Given a 12-24 hour window for resumption of anti-coagulation in moderate to high risk bronchoscopy / lung biopsy patients, I will defer the decision for when to resume anti-coagulation to Dr. Valeta Harms in the a.m.  Maxcine Ham, MD

## 2019-04-29 ENCOUNTER — Encounter (HOSPITAL_COMMUNITY): Payer: Self-pay | Admitting: Internal Medicine

## 2019-04-29 LAB — CBC
HCT: 36.2 % — ABNORMAL LOW (ref 39.0–52.0)
Hemoglobin: 12.2 g/dL — ABNORMAL LOW (ref 13.0–17.0)
MCH: 31.4 pg (ref 26.0–34.0)
MCHC: 33.7 g/dL (ref 30.0–36.0)
MCV: 93.3 fL (ref 80.0–100.0)
Platelets: 358 10*3/uL (ref 150–400)
RBC: 3.88 MIL/uL — ABNORMAL LOW (ref 4.22–5.81)
RDW: 15.1 % (ref 11.5–15.5)
WBC: 12.1 10*3/uL — ABNORMAL HIGH (ref 4.0–10.5)
nRBC: 0 % (ref 0.0–0.2)

## 2019-04-29 LAB — CULTURE, BLOOD (ROUTINE X 2)

## 2019-04-29 LAB — GLUCOSE, CAPILLARY
Glucose-Capillary: 169 mg/dL — ABNORMAL HIGH (ref 70–99)
Glucose-Capillary: 141 mg/dL — ABNORMAL HIGH (ref 70–99)
Glucose-Capillary: 162 mg/dL — ABNORMAL HIGH (ref 70–99)
Glucose-Capillary: 164 mg/dL — ABNORMAL HIGH (ref 70–99)
Glucose-Capillary: 116 mg/dL — ABNORMAL HIGH (ref 70–99)

## 2019-04-29 LAB — HEPARIN LEVEL (UNFRACTIONATED): Heparin Unfractionated: 0.43 IU/mL (ref 0.30–0.70)

## 2019-04-29 MED ORDER — METOPROLOL TARTRATE 5 MG/5ML IV SOLN
INTRAVENOUS | Status: AC
  Filled 2019-04-29: qty 5

## 2019-04-29 MED ORDER — AMLODIPINE BESYLATE 5 MG PO TABS
5.0000 mg | ORAL_TABLET | Freq: Every day | ORAL | Status: DC
  Administered 2019-04-29 – 2019-05-02 (×4): 5 mg via ORAL
  Filled 2019-04-29 (×4): qty 1

## 2019-04-29 MED ORDER — DIPHENHYDRAMINE HCL 25 MG PO CAPS
25.0000 mg | ORAL_CAPSULE | Freq: Four times a day (QID) | ORAL | Status: DC | PRN
  Administered 2019-04-29 – 2019-05-05 (×9): 25 mg via ORAL
  Filled 2019-04-29 (×9): qty 1

## 2019-04-29 MED ORDER — HYDRALAZINE HCL 20 MG/ML IJ SOLN
10.0000 mg | Freq: Three times a day (TID) | INTRAMUSCULAR | Status: DC | PRN
  Administered 2019-04-29 – 2019-05-01 (×3): 10 mg via INTRAVENOUS
  Filled 2019-04-29 (×4): qty 1

## 2019-04-29 MED ORDER — METOPROLOL TARTRATE 5 MG/5ML IV SOLN
2.5000 mg | INTRAVENOUS | Status: AC | PRN
  Administered 2019-04-29: 2.5 mg via INTRAVENOUS

## 2019-04-29 MED ORDER — IPRATROPIUM-ALBUTEROL 0.5-2.5 (3) MG/3ML IN SOLN
3.0000 mL | Freq: Four times a day (QID) | RESPIRATORY_TRACT | Status: DC
  Administered 2019-04-29 (×2): 3 mL via RESPIRATORY_TRACT
  Filled 2019-04-29 (×3): qty 3

## 2019-04-29 MED ORDER — IPRATROPIUM-ALBUTEROL 0.5-2.5 (3) MG/3ML IN SOLN
3.0000 mL | Freq: Three times a day (TID) | RESPIRATORY_TRACT | Status: DC
  Administered 2019-04-30 – 2019-05-01 (×4): 3 mL via RESPIRATORY_TRACT
  Filled 2019-04-29 (×4): qty 3

## 2019-04-29 NOTE — Progress Notes (Signed)
ANTICOAGULATION CONSULT NOTE - Initial Consult  Pharmacy Consult for heparin Indication: Recent PE, Xarelto on hold  No Known Allergies  Patient Measurements: Height: 5\' 9"  (175.3 cm) Weight: 205 lb (93 kg) IBW/kg (Calculated) : 70.7 Heparin Dosing Weight: 91 kg  Vital Signs: Temp: 98 F (36.7 C) (03/18 0359) Temp Source: Oral (03/18 0359) BP: 178/82 (03/18 0530) Pulse Rate: 86 (03/18 0530)  Labs: Recent Labs    04/26/19 1429 04/26/19 1429 04/27/19 0201 04/27/19 0201 04/27/19 1824 04/27/19 1824 04/28/19 0109 04/29/19 0758  HGB  --   --  11.7*   < > 12.1*   < > 11.3* 12.2*  HCT  --   --  36.0*   < > 37.5*  --  34.1* 36.2*  PLT  --   --  262   < > 295  --  304 358  APTT 38*   < > 77*  --  46*  --  56*  --   LABPROT  --   --   --   --  15.2  --   --   --   INR  --   --   --   --  1.2  --   --   --   HEPARINUNFRC 1.72*  --  1.24*  --   --   --  0.28*  --   CREATININE  --   --  1.22  --  1.11  --  1.05  --    < > = values in this interval not displayed.    Estimated Creatinine Clearance: 60 mL/min (by C-G formula based on SCr of 1.05 mg/dL).   Medical History: Past Medical History:  Diagnosis Date  . A-fib ([place])   . DM2 (diabetes mellitus, type 2) ([place])   . Hypertension    Assessment: Pt is a 56 yoM with PMH significant for afib, recently diagnosed PE, recently diagnosed likely metastatic cancer with pulmonary nodules.   Pt was recently admitted from 04/20/19 - 04/22/19 at which time he was diagnosed with bilateral PE and prescribed rivaroxaban. Rivaroxaban 15 mg PO BID initiated on 3/11, last dose was given at 0815 on 04/26/19. Anticoagulation was converted to heparin drip on 3/15 for planned procedures.   Significant Events: -3/16: ERCP -3/17: Bronch  Today, 04/29/19  CBC: Hgb 12.2 remains low but stable; Plt WNL & stable  SCr 1.05, CrCl 60 mL/min. Improved  APTT and HL correlated on 3/17, can monitor using HL only now.  Heparin resumed at previous rate  of 1400 units/hr with no bolus at 0800 this morning. Pt lost IV access for ~40 minutes this morning, drip resumed ~1015.  Goal of Therapy:  Heparin level 0.3-0.7 units/ml Monitor platelets by anticoagulation protocol: Yes   Plan:   Continue heparin infusion at 1400 units/hr  Check 8 hour HL.   CBC, HL daily while on heparin  Monitor for signs and symptoms of bleeding  Lenis Noon, PharmD 04/29/19 11:23 AM

## 2019-04-29 NOTE — TOC Initial Note (Signed)
Transition of Care [facility]) - Initial/Assessment Note    Patient Details  Name: Dennis Stephens MRN: [id number] Date of Birth: 1933/12/22  [facility] CM/SW Contact:    Lennart Pall, LCSW Phone Number: 04/29/2019, 3:32 PM  Clinical Narrative:  Met with pt, wife and pt's sister today to review home supports and d/c planning needs.  Pt lying in bed and able to completes interview without much difficulty.  Spouse and sister are supportive and spouse reports she will provide needed support.  Pt and wife note they anticipate having hospice/ palliative services at home, however, awaiting biopsy reports.  Pt appeared very calm and realistic about his likely cancer diagnosis.  They are agreeable for SW to monitor d/c needs.                 Expected Discharge Plan: [place] Services(vs Home with hospice/ palliative) Barriers to Discharge: Continued Medical Work up   Patient Goals and CMS Choice Patient states their goals for this hospitalization and ongoing recovery are:: "I want to get home"      Expected Discharge Plan and Services Expected Discharge Plan: [place] Services(vs Home with hospice/ palliative) In-house Referral: Clinical Social Work     Living arrangements for the past 2 months: Single Family Home Expected Discharge Date: (unknown)                                    Prior Living Arrangements/Services Living arrangements for the past 2 months: Single Family Home Lives with:: Spouse Patient language and need for interpreter reviewed:: Yes Do you feel safe going back to the place where you live?: Yes      Need for Family Participation in Patient Care: Yes (Comment) Care giver support system in place?: Yes (comment)   Criminal Activity/Legal Involvement Pertinent to Current Situation/Hospitalization: No - Comment as needed  Activities of Daily Living Home Assistive Devices/Equipment: Dentures (specify type), Eyeglasses(upper/lower  dentures) ADL Screening (condition at time of admission) Patient's cognitive ability adequate to safely complete daily activities?: Yes Is the patient deaf or have difficulty hearing?: No Does the patient have difficulty seeing, even when wearing glasses/contacts?: No Does the patient have difficulty concentrating, remembering, or making decisions?: No Patient able to express need for assistance with ADLs?: Yes Does the patient have difficulty dressing or bathing?: No Independently performs ADLs?: Yes (appropriate for developmental age) Does the patient have difficulty walking or climbing stairs?: No Weakness of Legs: Both Weakness of Arms/Hands: None  Permission Sought/Granted Permission sought to share information with : Family Supports Permission granted to share information with : Yes, Verbal Permission Granted  Share Information with NAME: Dennis Stephens     Permission granted to share info w Relationship: spouse  Permission granted to share info w Contact Information: [phone]  Emotional Assessment Appearance:: Appears stated age Attitude/Demeanor/Rapport: Gracious Affect (typically observed): Pleasant, Accepting Orientation: : Oriented to Self, Oriented to Place, Oriented to  Time, Oriented to Situation Alcohol / Substance Use: Not Applicable Psych Involvement: No (comment)  Admission diagnosis:  Acute pancreatitis [K85.90] Acute pancreatitis without infection or necrosis, unspecified pancreatitis type [K85.90] Patient Active Problem List   Diagnosis Date Noted  . Bile duct stone   . Acute pancreatitis 04/25/2019  . Pulmonary embolism ([place]) 04/21/2019  . Lung mass 04/21/2019  . Essential hypertension 04/21/2019  . Elevated LFTs 04/21/2019  . ARF (acute renal failure) ([place])  04/21/2019   PCP:  [place]:   [place], [place] - [place] [phone] [place]  [address] Phone: [phone] Fax: [id number] [place], [place] [address] Phone: [phone] Fax: [phone]     Social Determinants of Health ([place]) Interventions    Readmission Risk Interventions Readmission Risk Prevention Plan 04/29/2019  Transportation Screening Complete  PCP or Specialist Appt within 5-7 Days Complete  Home Care Screening Complete  Medication Review (RN CM) Complete

## 2019-04-29 NOTE — Progress Notes (Signed)
NAME:  Dennis Stephens, MRN:  [id number], DOB:  Apr 03, 1933, LOS: 4 ADMISSION DATE:  04/25/2019, CONSULTATION DATE: 04/26/2019 REFERRING MD: Dr. Tana Coast, CHIEF COMPLAINT: Lung mass  Brief History   86 year old gentleman, newly found lung mass on last hospital admission.  Patient was discharged with follow-up with the [facility] however came back with gallstone pancreatitis.  Currently here with plans for ERCP asked to see patient again to discuss potential biopsy.  History of present illness   86 year old gentleman past medical history of tobacco abuse, newly diagnosed bilateral PE, currently on Xarelto.  Recently admitted last week with plans for navigational bronchoscopy and EBUS bronchoscopy with TB NA of the mediastinum for last Friday.  However patient made decision for requesting discharge home and follow-up at the [place].  Patient return to the hospital yesterday with abdominal pain found to have gallstone pancreatitis.  GI has seen the patient with recommendations for ERCP.  Unfortunately patient is currently taking Xarelto.  He did receive Xarelto dosing this morning.  Patient is a former smoker.  Quit 35 years ago.  First wife passed from lung cancer.  She was also a smoker.  Imaging of the abdomen revealed a 3.5 cm CBD with stone.  Past Medical History   Past Medical History:  Diagnosis Date  . A-fib ([place])   . DM2 (diabetes mellitus, type 2) ([place])   . Hypertension     Significant Hospital Events   04/27/2019: ERCP  Consults:  Gastroenterology  Procedures:  04/27/2019: ERCP   04/28/2019: Bronchoscopy at [facility], pending  Significant Diagnostic Tests:  CT scan of the chest with left upper lobe lung mass and associated mediastinal adenopathy.  MRCP 3/15 > 1. Exam is positive for choledocholithiasis. At least 4 stones are noted within the distal measuring up to 7 mm. There is increase caliber of the CBD which measures 1.1 cm in maximum dimension. 2. Gallbladder sludge and mild diffuse  gallbladder wall thickening. Cannot rule out cholecystitis 3. Two areas of abnormal increased T2 signal are identified within segment 8/4 and segment 7. The enhancement characteristics of these lesions are difficult to assess due to abnormal motion artifact on the postcontrast images. As mentioned on exam from 04/20/2019 these are new when compared with 01/20/2019. Metastatic disease cannot be excluded. 4. Mild diffuse pancreatic edema compatible with uncomplicated acute pancreatitis. No pancreatic necrosis, main duct dilatation or pseudocyst identified at this time. 5. No MRI correlate to the suspicious right kidney lesion identified on recent CT. This remains indeterminate and may not be seen due to motion artifact on today's study. Recommend follow-up imaging after resolution of this acute episode. Ideally this should be performed as an outpatient when the patient is able to lay motionless and breath hold.   Micro Data:  COVID 3/14 > Negative  Blood cultures 3/15 > 1x positive coag neg staph   Antimicrobials:  Piperacillin tazobactam  Interim history/subjective:  Sitting in bed with neb treatment in progress, states he has had slightly worse cough with sputum production and wheeze post bronch. RN reports no acute events.   Objective   Blood pressure (!) 153/63, pulse 69, temperature 98.1 F (36.7 C), temperature source Oral, resp. rate 20, height 5\' 9"  (1.753 m), weight 93 kg, SpO2 96 %.        Intake/Output Summary (Last 24 hours) at 04/29/2019 1447 Last data filed at 04/29/2019 1315 Gross per 24 hour  Intake 1594.63 ml  Output 1435 ml  Net 159.63 ml   [employer]  04/25/19 1718 04/27/19 0842 04/28/19 1331  Weight: 96 kg 96 kg 93 kg    Examination: General: Chronically ill appearing elderly male lying in bed, in NAD HEENT: [place]/AT, MM pink/moist, PERRL,  Neuro: Alert and oriented x3, non-focal CV: s1s2 regular rate and rhythm, no murmur, rubs, or gallops,  PULM:   Faint expiratory wheeze bilaterally, no increased work of breathing, no accessory muscle use seen GI: soft, bowel sounds active in all 4 quadrants, non-tender, non-distended Extremities: warm/dry, no edema  Skin: no rashes or lesions  Resolved Hospital Problem list     Assessment & Plan:   Bilateral upper lobe solitary pulmonary nodules, enlarged 4R lymphnode - imaging concerning for bronchogenic carcinoma - underwent bronch 3/17 P: Path results pending  Continue heparin drip  Continue bronchodilators as needed  Continue Mucinex  Encourage to continue adequate and frequent pulmonary hygiene  Repeat CXR intermittently   Obstructed common bile duct, gallstone pancreatitis Management per primary   Best practice:  Diet: Clear liquid  DVT prophylaxis: Heparin drip Code Status: DNR  Labs   CBC: Recent Labs  Lab 04/26/19 0532 04/27/19 0201 04/27/19 1824 04/28/19 0109 04/29/19 0758  WBC 26.5* 15.3* 10.6* 12.2* 12.1*  HGB 12.2* 11.7* 12.1* 11.3* 12.2*  HCT 37.3* 36.0* 37.5* 34.1* 36.2*  MCV 94.2 94.2 94.0 93.7 93.3  PLT 288 262 295 304 027    Basic Metabolic Panel: Recent Labs  Lab 04/25/19 1721 04/26/19 0532 04/27/19 0201 04/27/19 1824 04/28/19 0109  NA 130* 128* 129* 126* 128*  K 3.8 4.9 4.4 4.9 4.8  CL 96* 98 100 99 102  CO2 22 20* 20* 20* 19*  GLUCOSE 184* 232* 124* 301* 282*  BUN 15 23 27* 25* 27*  CREATININE 1.10 1.55* 1.22 1.11 1.05  CALCIUM 8.7* 8.0* 8.0* 8.2* 8.1*   GFR: Estimated Creatinine Clearance: 60 mL/min (by C-G formula based on SCr of 1.05 mg/dL). Recent Labs  Lab 04/27/19 0201 04/27/19 1824 04/28/19 0109 04/29/19 0758  WBC 15.3* 10.6* 12.2* 12.1*    Liver Function Tests: Recent Labs  Lab 04/25/19 1721 04/26/19 0532 04/27/19 0201 04/27/19 1824 04/28/19 0109  AST 287* 506* 372* 261* 176*  ALT 200* 298* 263* 236* 196*  ALKPHOS 569* 551* 522* 625* 550*  BILITOT 4.3* 6.4* 5.9* 4.1* 2.9*  PROT 6.7 6.0* 5.9* 6.1* 5.5*  ALBUMIN  2.6* 2.3* 2.3* 2.2* 2.0*   Recent Labs  Lab 04/25/19 1721 04/26/19 0532 04/27/19 0201 04/28/19 0109  LIPASE 2,207* 1,879* 1,537* 206*   No results for input(s): AMMONIA in the last 168 hours.  ABG    Component Value Date/Time   TCO2 25 01/20/2019 1823     Coagulation Profile: Recent Labs  Lab 04/26/19 0532 04/27/19 1824  INR 1.2 1.2    Cardiac Enzymes: No results for input(s): CKTOTAL, CKMB, CKMBINDEX, TROPONINI in the last 168 hours.  HbA1C: Hgb A1c MFr Bld  Date/Time Value Ref Range Status  04/21/2019 05:34 AM 8.7 (H) 4.8 - 5.6 % Final    Comment:    (NOTE) Pre diabetes:          5.7%-6.4% Diabetes:              >6.4% Glycemic control for   <7.0% adults with diabetes     CBG: Recent Labs  Lab 04/28/19 2022 04/28/19 2327 04/29/19 0400 04/29/19 0747 04/29/19 [place], NP-C [facility] Contact / Pager information  can be found on Amion  04/29/2019, 2:54 PM

## 2019-04-29 NOTE — Progress Notes (Signed)
Brief Pharmacy Note re: IV Heparin  For complete details see earlier progress note from Theodis Shove, PharmD  O:  Heparin level-0.43 on heparin infusion at 1400 ml/hr       No bleeding or infusion related issues per nursing  A/P  Heparin level is therapeutic. Continue current rate.          F/U 8h heparin level with morning labs.  Netta Cedars, PharmD, BCPS 04/29/2019@8 :36 PM

## 2019-04-29 NOTE — Progress Notes (Signed)
Pharmacy Brief Note:  Confirmed with CCM that it is ok to resume heparin infusion at this time with no initial bolus.   Lenis Noon, PharmD 04/29/19 7:47 AM

## 2019-04-29 NOTE — Consult Note (Addendum)
[place]  [phone] Fax:([phone]   MEDICAL ONCOLOGY - INITIAL CONSULTATION  Referral MD: Dr. June Leap  Reason for Referral: Bilateral pulmonary nodules, hepatic lesion, sclerotic lesions  HPI: Dennis Stephens is an 86 year old male with a past medical history significant for hypertension, atrial fibrillation, recent diagnosis of bilateral PE.  She presented to the hospital on 04/21/2019 to chest pain.  A CT angiogram of the chest and CT of the abdomen pelvis were performed which showed bilateral pulmonary emboli without right heart strain, malignant appearing hilar and mediastinal lymphadenopathy, hepatic lesion concerning for metastatic disease, T12 and right proximal femoral shaft sclerotic lesions.  He has had a CT of the head without contrast which did not show any evidence of metastatic disease.  He was started on Xarelto for his pulmonary emboli.  He was offered further work-up with bronchoscopy but he declined and wanted to discuss this further at the [place].  Is discharged home on 04/22/2019.  He subsequently returned to the emergency room on 04/25/2019 with abdominal pain.  He also reported some nausea and vomiting, abdominal swelling, yellowing of the eyes, and yellowing under his tongue.  He was seen at the [place] in between hospital admissions and declined further work-up for his pulmonary nodules there as well.  Labs on admission were significant for a WBC of 15.4, sodium 130, albumin 2.6, AST 287, ALT 200, alkaline phosphatase 569, and total bilirubin of 4.3.  He also has significantly elevated lipase and was diagnosed with pancreatitis.  He was seen by gastroenterology and underwent ERCP on 04/27/2019 and underwent stone extraction.  Lipase, LFTs, and total bilirubin are trending downward.  The patient had a bronchoscopy performed on 04/28/2019.  Biopsies are pending.  Today, the patient reports that he feels tired.  He reports having a productive cough without  hemoptysis.  He is also reporting shortness of breath.  He reports that his appetite is good but that he has lost about 15 pounds over the past few months.  Denies recent fevers or chills.  Denies headaches and dizziness.  Denies chest discomfort.  Reports that his abdominal pain has improved.  He is not having any nausea, vomiting, constipation, diarrhea.  Denies pain.  No bleeding reported.  The patient is married and has 5 children.  Denies history of alcohol use.  Reports that he smoked a pack of cigarettes x30 years.  He quit about 35 years ago.  Denies family history of cancer.  Medical oncology was asked see the patient to make recommendations regarding his pulmonary nodules.    Past Medical History:  Diagnosis Date  . A-fib ([place])   . DM2 (diabetes mellitus, type 2) ([place])   . Hypertension   :  Past Surgical History:  Procedure Laterality Date  . LIPOMA EXCISION    :  Current Facility-Administered Medications  Medication Dose Route Frequency Provider Last Rate Last Admin  . 0.9 %  sodium chloride infusion   Intravenous Continuous Frederik Pear, MD 75 mL/hr at 04/29/19 0300 Rate Verify at 04/29/19 0300  . albuterol (PROVENTIL) (2.5 MG/3ML) 0.083% nebulizer solution 3 mL  3 mL Inhalation Q6H PRN Florencia Reasons, MD   3 mL at 04/29/19 0825  . carvedilol (COREG) tablet 12.5 mg  12.5 mg Oral BID WC Irene Shipper, MD   12.5 mg at 04/29/19 0840  . diphenhydrAMINE (BENADRYL) capsule 25 mg  25 mg Oral Q6H PRN Alma Friendly, MD   25 mg at 04/29/19 0840  .  docusate sodium (COLACE) capsule 100 mg  100 mg Oral BID Irene Shipper, MD   100 mg at 04/28/19 2223  . guaiFENesin (MUCINEX) 12 hr tablet 600 mg  600 mg Oral BID Irene Shipper, MD   600 mg at 04/28/19 2223  . guaiFENesin-dextromethorphan (ROBITUSSIN DM) 100-10 MG/5ML syrup 5 mL  5 mL Oral Q4H PRN Alma Friendly, MD   5 mL at 04/28/19 2011  . heparin ADULT infusion 100 units/mL (25000 units/254mL sodium chloride 0.45%)  1,400 Units/hr  Intravenous Continuous Frederik Pear, MD 14 mL/hr at 04/29/19 0801 1,400 Units/hr at 04/29/19 0801  . hydrALAZINE (APRESOLINE) injection 10 mg  10 mg Intravenous Q8H PRN Alma Friendly, MD   10 mg at 04/29/19 0840  . insulin aspart (novoLOG) injection 0-15 Units  0-15 Units Subcutaneous Q4H Irene Shipper, MD   2 Units at 04/29/19 0840  . ipratropium-albuterol (DUONEB) 0.5-2.5 (3) MG/3ML nebulizer solution 3 mL  3 mL Nebulization Q6H Alma Friendly, MD      . lactated ringers infusion   Intravenous Continuous Irene Shipper, MD 10 mL/hr at 04/27/19 1660 Restarted at 04/27/19 1013  . loratadine (CLARITIN) tablet 10 mg  10 mg Oral Daily PRN Irene Shipper, MD   10 mg at 04/29/19 0122  . morphine 2 MG/ML injection 2-4 mg  2-4 mg Intravenous Q4H PRN Irene Shipper, MD   2 mg at 04/27/19 2120  . ondansetron (ZOFRAN) injection 4 mg  4 mg Intravenous Q6H PRN Irene Shipper, MD   4 mg at 04/27/19 2026  . pantoprazole (PROTONIX) injection 40 mg  40 mg Intravenous Q24H Irene Shipper, MD   40 mg at 04/27/19 1540  . phenol (CHLORASEPTIC) mouth spray 1 spray  1 spray Mouth/Throat PRN Alma Friendly, MD   1 spray at 04/28/19 2315  . piperacillin-tazobactam (ZOSYN) IVPB 3.375 g  3.375 g Intravenous Q8H Irene Shipper, MD 12.5 mL/hr at 04/29/19 0503 3.375 g at 04/29/19 0503  . primidone (MYSOLINE) tablet 100 mg  100 mg Oral Daily Irene Shipper, MD   100 mg at 04/28/19 1029  . senna (SENOKOT) tablet 8.6 mg  1 tablet Oral BID Irene Shipper, MD   8.6 mg at 04/28/19 2223  . sertraline (ZOLOFT) tablet 100 mg  100 mg Oral Daily Irene Shipper, MD   100 mg at 04/28/19 0836  . sodium chloride flush (NS) 0.9 % injection 3 mL  3 mL Intravenous Once Irene Shipper, MD      . temazepam (RESTORIL) capsule 30 mg  30 mg Oral QHS PRN Irene Shipper, MD   30 mg at 04/29/19 0122     No Known Allergies:  Family History  Problem Relation Age of Onset  . Hypertension Mother   . Hypertension Father   . Diabetes  Mellitus II Neg Hx   :  Social History   Socioeconomic History  . Marital status: Married    Spouse name: Not on file  . Number of children: Not on file  . Years of education: Not on file  . Highest education level: Not on file  Occupational History  . Not on file  Tobacco Use  . Smoking status: Former Research scientist (life sciences)  . Smokeless tobacco: Never Used  Substance and Sexual Activity  . Alcohol use: Not Currently  . Drug use: Not Currently  . Sexual activity: Not Currently  Other Topics Concern  . Not on file  Social History Narrative  . Not on file   Social Determinants of Health   Financial Resource Strain:   . Difficulty of Paying Living Expenses:   Food Insecurity:   . Worried About Charity fundraiser in the Last Year:   . Arboriculturist in the Last Year:   Transportation Needs:   . Film/video editor (Medical):   Marland Kitchen Lack of Transportation (Non-Medical):   Physical Activity:   . Days of Exercise per Week:   . Minutes of Exercise per Session:   Stress:   . Feeling of Stress :   Social Connections:   . Frequency of Communication with Friends and Family:   . Frequency of Social Gatherings with Friends and Family:   . Attends Religious Services:   . Active Member of Clubs or Organizations:   . Attends Archivist Meetings:   Marland Kitchen Marital Status:   Intimate Partner Violence:   . Fear of Current or Ex-Partner:   . Emotionally Abused:   Marland Kitchen Physically Abused:   . Sexually Abused:   :  Review of Systems: A comprehensive 14 point review of systems was negative except as noted in the HPI.  Exam: Patient Vitals for the past 24 hrs:  BP Temp Temp src Pulse Resp SpO2 Height Weight  04/29/19 0826 -- -- -- -- -- 97 % -- --  04/29/19 0530 (!) 178/82 -- -- 86 -- 100 % -- --  04/29/19 0359 (!) 182/88 98 F (36.7 C) Oral 71 20 100 % -- --  04/28/19 2020 (!) 172/78 98.1 F (36.7 C) Oral 78 (!) 21 96 % -- --  04/28/19 1836 (!) 154/78 98 F (36.7 C) Oral 78 18 97 % -- --   04/28/19 1752 -- 98 F (36.7 C) -- 74 (!) 25 99 % -- --  04/28/19 1751 -- -- -- 70 (!) 25 96 % -- --  04/28/19 1748 -- -- -- 76 (!) 22 97 % -- --  04/28/19 1747 139/68 -- -- (!) 37 (!) 24 95 % -- --  04/28/19 1745 -- -- -- 72 (!) 25 98 % -- --  04/28/19 1732 (!) 146/63 -- -- 70 (!) 26 95 % -- --  04/28/19 1730 -- 98 F (36.7 C) -- 71 (!) 21 95 % -- --  04/28/19 1717 (!) 147/66 -- -- 72 (!) 22 96 % -- --  04/28/19 1715 -- -- -- 71 (!) 21 95 % -- --  04/28/19 1703 (!) 147/67 -- -- 72 (!) 21 96 % -- --  04/28/19 1700 -- -- -- 72 (!) 22 95 % -- --  04/28/19 1648 (!) 150/73 -- -- 73 18 97 % -- --  04/28/19 1645 -- -- -- 73 (!) 22 95 % -- --  04/28/19 1633 (!) 152/63 (!) 97 F (36.1 C) -- 71 (!) 23 94 % -- --  04/28/19 1410 (!) 194/76 -- -- -- -- -- -- --  04/28/19 1400 (!) 206/83 -- -- -- -- -- -- --  04/28/19 1331 (!) 218/84 98 F (36.7 C) Oral 73 17 98 % 5\' 9"  (1.753 m) 93 kg    General:  well-nourished in no acute distress.   Eyes:  no scleral icterus.   ENT:  There were no oropharyngeal lesions.   Lymphatics:  Negative cervical, supraclavicular or axillary adenopathy.   Respiratory: lungs were clear bilaterally without wheezing or crackles.   Cardiovascular:  Regular rate and rhythm, S1/S2, without murmur, rub  or gallop. There was no pedal edema.   GI:  abdomen was soft, flat, nontender, nondistended, without organomegaly.   Musculoskeletal:  no spinal tenderness of palpation of vertebral spine.   Skin exam was without echymosis, petichae.   Neuro exam was nonfocal. Patient was alert and oriented.  Attention was good.   Language was appropriate.  Mood was normal without depression.  Speech was not pressured.  Thought content was not tangential.     Lab Results  Component Value Date   WBC 12.1 (H) 04/29/2019   HGB 12.2 (L) 04/29/2019   HCT 36.2 (L) 04/29/2019   PLT 358 04/29/2019   GLUCOSE 282 (H) 04/28/2019   ALT 196 (H) 04/28/2019   AST 176 (H) 04/28/2019   NA 128 (L)  04/28/2019   K 4.8 04/28/2019   CL 102 04/28/2019   CREATININE 1.05 04/28/2019   BUN 27 (H) 04/28/2019   CO2 19 (L) 04/28/2019    DG Chest 2 View  Result Date: 04/20/2019 CLINICAL DATA:  Chest pain for several hours, history of lung carcinoma EXAM: CHEST - 2 VIEW COMPARISON:  01/20/2019 CT FINDINGS: Cardiac shadow is within normal limits. There is a rounded nodular density identified in the left lung measuring 3.3 cm which corresponds to that seen on the prior exam at which time it measured 1.8 cm. Patchy changes are noted in the right upper lobe also increased when compared with the prior exam. No sizable effusion is noted. No bony abnormality is noted. IMPRESSION: Enlarging bilateral upper lobe nodules as described. This is consistent with the patient's given clinical history Electronically Signed   By: Inez Catalina M.D.   On: 04/20/2019 21:56   CT Head Wo Contrast  Result Date: 04/21/2019 CLINICAL DATA:  86 year old male with pulmonary emboli, malignant appearing mediastinal lymphadenopathy and liver lesion on CT yesterday. Query brain Mets. EXAM: CT HEAD WITHOUT CONTRAST TECHNIQUE: Contiguous axial images were obtained from the base of the skull through the vertex without intravenous contrast. COMPARISON:  No prior head imaging. FINDINGS: Brain: IV contrast was administered for chest abdomen and pelvis CT 1 hour before these images. Patchy bilateral white matter hypodensity, but no areas suspicious for vasogenic edema. No midline shift, mass effect, or evidence of intracranial mass lesion. No abnormal enhancement identified. No ventriculomegaly. No cortical encephalomalacia identified. No cortically based acute infarct identified. There is a small hypodensity in the posterior right cerebellum on series 2, image 7, probably a small chronic cerebellar infarct. Vascular: Some residual intravascular contrast is present as above. The major intracranial vascular structures seem to be enhancing as  expected. Skull: No acute or suspicious osseous lesion identified. Sinuses/Orbits: Visualized paranasal sinuses and mastoids are clear. Other: No acute orbit or scalp soft tissue findings. IMPRESSION: 1. No metastatic disease to the brain is identified by CT with very delayed contrast timing. MRI Head without and with contrast would be most sensitive. 2. No acute intracranial abnormality identified. 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 Electronically Signed   By: Genevie Ann M.D.   On: 04/21/2019 01:08   CT Angio Chest PE W and/or Wo Contrast  Result Date: 04/21/2019 CLINICAL DATA:  Non-specific chest pain and abdominal pain since 1500 hours. History of hypertension and atrial fibrillation. EXAM: CT ANGIOGRAPHY CHEST CT ABDOMEN AND PELVIS WITH CONTRAST TECHNIQUE: Multidetector CT imaging of the chest was performed using the standard protocol during bolus administration of intravenous contrast. Multiplanar CT image reconstructions and  MIPs were obtained to evaluate the vascular anatomy. Multidetector CT imaging of the abdomen and pelvis was performed using the standard protocol during bolus administration of intravenous contrast. Multidetector CT imaging of the chest was performed using the standard protocol during bolus administration of intravenous contrast. Multiplanar CT image reconstructions and MIPs were obtained to evaluate the vascular anatomy. Automatic exposure control utilized. CONTRAST:  197mL OMNIPAQUE IOHEXOL 350 MG/ML SOLN COMPARISON:  January 20, 2019. FINDINGS: CTA CHEST FINDINGS Cardiovascular: Pulmonary emboli in the bilateral proximal lobar pulmonary arteries and more peripheral bilateral pulmonary arteries. No central pulmonary artery embolism. Right ventricular to left ventricular ratio 0.7; no right heart strain. Mild cardiomegaly with fatty hypertrophy of the inter atrial septum and four-vessel moderate to severe coronary  calcification. Mediastinum/Nodes: Metastatic appearing right paratracheal conglomerate adenopathy measuring 3.2 by 2.4 cm. Metastatic-appearing superior mediastinal lymph node measuring 23 by 16 mm. Metastatic appearing right hilar lymph node measuring 16 by 22 mm. Additional smaller mediastinal and bilateral hilar lymph nodes that could be metastatic or benign. Small hiatal hernia. Lungs/Pleura: No pleural effusion. Bilateral spiculated upper lobe pulmonary nodules measuring 2.4 cm diameter in the left upper lobe on series 2, image 39, and 2.6 x 2.0 cm in the right upper lobe on image 26. Both nodules have increased in size is since December 2020 at which time they measured 19 x 18 mm on the left, and 8 mm diameter on the right. Respiratory motion artifact Upper Abdomen: Reported on concomitant CT abdomen and pelvis reported above. Musculoskeletal: Bone demineralization. Moderate skeletal degenerative change. A 6 mm sclerotic lesion in the T12 vertebral body stable compared to December 2020. Review of the MIP images confirms the above findings. CT ABDOMEN and PELVIS FINDINGS Hepatobiliary: A new 14 mm hypoattenuating lesion in the right hemi liver, highly worrisome for metastasis. Chronic mild intrahepatic and 14 mm proximal common bile duct dilatation with normal distal tapering. Normal gallbladder. Pancreas: Normal. Spleen: Normal. Adrenals/Urinary Tract: Normal bilateral adrenal glands. Bilateral medical renal disease and simple appearing renal cortical cysts measuring up to 4.4 cm on the right. No hydronephrosis or nephroureterolithiasis bilaterally. No apparent urinary bladder abnormality. A dense 10 mm right renal midpole hypoattenuation. Stomach/Bowel: No bowel obstruction. Moderate stool burden and diverticulosis coli without an acute diverticulitis or apparent bowel wall thickening. Normal appendix, axial image 60. Vascular/Lymphatic: Aortic bilateral iliac in major abdominal artery calcified  atherosclerosis with probably clinically significant 75% narrowing of the left common iliac artery. No abdominal aortic aneurysm. Small nonspecific retrocrural, gastrohepatic ligament and retroperitoneal lymph nodes. A mildly enlarged periportal lymph node measuring 12 x 16 mm. No abdominal aortic dissection. Reproductive: Mild prostatomegaly. Other: None. Musculoskeletal: Moderate skeletal degenerative change. A 9 mm ovoid sclerotic lesion in the right proximal femoral shaft, unchanged. Review of the MIP images confirms the above findings. I discussed critical Value/emergent results by telephone at the time of interpretation on 04/21/2019 at 12:12 am with provider Providence Lanius , who verbally acknowledged these results. IMPRESSION: Acute bilateral lobar and peripheral pulmonary emboli without right heart strain. No thoracoabdominal aortic dissection. Suspicious bilateral upper lobe solid pulmonary nodules, both increased in size since December 2020. Histopathological correlation recommended. An embolic or infectious etiology are less likely differential considerations. Malignant-appearing mediastinal and right hilar adenopathy. Enlarged nonspecific gastrohepatic node. Additional small nonspecific lymph nodes above and below diaphragm which could be metastatic or benign. New hepatic lesion concerning for metastatic disease. Normal bilateral adrenal glands. A new 10 mm dense right renal midpole lesion; renal ultrasonography recommended.  A proteinaceous or hemorrhagic cortical cyst or malignant renal lesion are differential considerations. Mild cardiomegaly with four-vessel moderate to severe coronary calcification. Small hiatal hernia. Stable 6 mm T12 and 9 mm right proximal femoral shaft sclerotic lesions since December 2020. Thoracoabdominal aorta and major abdominal artery calcified atherosclerosis without aneurysm. Probably clinically significant 75% atherosclerotic narrowing of the right common iliac artery.  Diverticulosis coli. Chronic biliary dilatation. Electronically Signed   By: Revonda Humphrey   On: 04/21/2019 00:17   CT ABDOMEN PELVIS W CONTRAST  Result Date: 04/21/2019 CLINICAL DATA:  Non-specific chest pain and abdominal pain since 1500 hours. History of hypertension and atrial fibrillation. EXAM: CT ANGIOGRAPHY CHEST CT ABDOMEN AND PELVIS WITH CONTRAST TECHNIQUE: Multidetector CT imaging of the chest was performed using the standard protocol during bolus administration of intravenous contrast. Multiplanar CT image reconstructions and MIPs were obtained to evaluate the vascular anatomy. Multidetector CT imaging of the abdomen and pelvis was performed using the standard protocol during bolus administration of intravenous contrast. Multidetector CT imaging of the chest was performed using the standard protocol during bolus administration of intravenous contrast. Multiplanar CT image reconstructions and MIPs were obtained to evaluate the vascular anatomy. Automatic exposure control utilized. CONTRAST:  124mL OMNIPAQUE IOHEXOL 350 MG/ML SOLN COMPARISON:  January 20, 2019. FINDINGS: CTA CHEST FINDINGS Cardiovascular: Pulmonary emboli in the bilateral proximal lobar pulmonary arteries and more peripheral bilateral pulmonary arteries. No central pulmonary artery embolism. Right ventricular to left ventricular ratio 0.7; no right heart strain. Mild cardiomegaly with fatty hypertrophy of the inter atrial septum and four-vessel moderate to severe coronary calcification. Mediastinum/Nodes: Metastatic appearing right paratracheal conglomerate adenopathy measuring 3.2 by 2.4 cm. Metastatic-appearing superior mediastinal lymph node measuring 23 by 16 mm. Metastatic appearing right hilar lymph node measuring 16 by 22 mm. Additional smaller mediastinal and bilateral hilar lymph nodes that could be metastatic or benign. Small hiatal hernia. Lungs/Pleura: No pleural effusion. Bilateral spiculated upper lobe pulmonary nodules  measuring 2.4 cm diameter in the left upper lobe on series 2, image 39, and 2.6 x 2.0 cm in the right upper lobe on image 26. Both nodules have increased in size is since December 2020 at which time they measured 19 x 18 mm on the left, and 8 mm diameter on the right. Respiratory motion artifact Upper Abdomen: Reported on concomitant CT abdomen and pelvis reported above. Musculoskeletal: Bone demineralization. Moderate skeletal degenerative change. A 6 mm sclerotic lesion in the T12 vertebral body stable compared to December 2020. Review of the MIP images confirms the above findings. CT ABDOMEN and PELVIS FINDINGS Hepatobiliary: A new 14 mm hypoattenuating lesion in the right hemi liver, highly worrisome for metastasis. Chronic mild intrahepatic and 14 mm proximal common bile duct dilatation with normal distal tapering. Normal gallbladder. Pancreas: Normal. Spleen: Normal. Adrenals/Urinary Tract: Normal bilateral adrenal glands. Bilateral medical renal disease and simple appearing renal cortical cysts measuring up to 4.4 cm on the right. No hydronephrosis or nephroureterolithiasis bilaterally. No apparent urinary bladder abnormality. A dense 10 mm right renal midpole hypoattenuation. Stomach/Bowel: No bowel obstruction. Moderate stool burden and diverticulosis coli without an acute diverticulitis or apparent bowel wall thickening. Normal appendix, axial image 60. Vascular/Lymphatic: Aortic bilateral iliac in major abdominal artery calcified atherosclerosis with probably clinically significant 75% narrowing of the left common iliac artery. No abdominal aortic aneurysm. Small nonspecific retrocrural, gastrohepatic ligament and retroperitoneal lymph nodes. A mildly enlarged periportal lymph node measuring 12 x 16 mm. No abdominal aortic dissection. Reproductive: Mild prostatomegaly. Other: None. Musculoskeletal:  Moderate skeletal degenerative change. A 9 mm ovoid sclerotic lesion in the right proximal femoral shaft,  unchanged. Review of the MIP images confirms the above findings. I discussed critical Value/emergent results by telephone at the time of interpretation on 04/21/2019 at 12:12 am with provider Providence Lanius , who verbally acknowledged these results. IMPRESSION: Acute bilateral lobar and peripheral pulmonary emboli without right heart strain. No thoracoabdominal aortic dissection. Suspicious bilateral upper lobe solid pulmonary nodules, both increased in size since December 2020. Histopathological correlation recommended. An embolic or infectious etiology are less likely differential considerations. Malignant-appearing mediastinal and right hilar adenopathy. Enlarged nonspecific gastrohepatic node. Additional small nonspecific lymph nodes above and below diaphragm which could be metastatic or benign. New hepatic lesion concerning for metastatic disease. Normal bilateral adrenal glands. A new 10 mm dense right renal midpole lesion; renal ultrasonography recommended. A proteinaceous or hemorrhagic cortical cyst or malignant renal lesion are differential considerations. Mild cardiomegaly with four-vessel moderate to severe coronary calcification. Small hiatal hernia. Stable 6 mm T12 and 9 mm right proximal femoral shaft sclerotic lesions since December 2020. Thoracoabdominal aorta and major abdominal artery calcified atherosclerosis without aneurysm. Probably clinically significant 75% atherosclerotic narrowing of the right common iliac artery. Diverticulosis coli. Chronic biliary dilatation. Electronically Signed   By: Revonda Humphrey   On: 04/21/2019 00:17   MR 3D Recon At Scanner  Result Date: 04/26/2019 CLINICAL DATA:  Evaluate for choledocholithiasis. EXAM: MRI ABDOMEN WITHOUT AND WITH CONTRAST (INCLUDING MRCP) TECHNIQUE: Multiplanar multisequence MR imaging of the abdomen was performed both before and after the administration of intravenous contrast. Heavily T2-weighted images of the biliary and pancreatic ducts  were obtained, and three-dimensional MRCP images were rendered by post processing. CONTRAST:  2mL GADAVIST GADOBUTROL 1 MMOL/ML IV SOLN COMPARISON:  CT AP 04/20/2019 FINDINGS: Lower chest: Trace bilateral pleural effusions. Hepatobiliary: Motion artifact diminishes exam detail within the liver. Corresponding to the CT abnormality is a focal area of increased T2 signal with restricted diffusion within segment 8/4. This measures 1.5 cm. A second more subtle area of increased T2 signal is noted within segment 7, image 12/3 and image 57/5. Enhancement characteristics of both of these lesions are difficult to assess due motion artifact. There is mild diffuse gallbladder wall thickening. Sludge is identified within the neck of the gallbladder. The common bile duct measures 1.1 cm in diameter. At least 4 stones are identified within the distal common bile duct which measure up to 7 mm, image 18/8. Pancreas: Mild diffuse edema is identified. No main duct dilatation or mass identified. No pancreatic necrosis or pseudocyst formation. Spleen:  Within normal limits in size and appearance. Adrenals/Urinary Tract:  Normal appearance of the adrenal glands. No hydronephrosis identified bilaterally. Bilateral perinephric fat stranding is identified, nonspecific in may be related to prior insult. Several T2 hyperintense kidney lesions are identified. The largest arises from the posterior cortex of the upper pole of left kidney measuring 4.5 cm and is compatible with a simple cyst. No solid enhancing mass or hydronephrosis identified bilaterally. No abnormal signal in or enhancement noted in the region of the recently characterized 10 mm right mid pole lesion. Stomach/Bowel: Visualized portions within the abdomen are unremarkable. Vascular/Lymphatic: Aortic atherosclerosis. No aneurysm. The portal vein remains patent. The splenic vein is also patent. No adenopathy Other:  No free fluid or fluid collections. Musculoskeletal: No  suspicious bone lesions identified. IMPRESSION: 1. Exam is positive for choledocholithiasis. At least 4 stones are noted within the distal measuring up to 7 mm. There  is increase caliber of the CBD which measures 1.1 cm in maximum dimension. 2. Gallbladder sludge and mild diffuse gallbladder wall thickening. Cannot rule out cholecystitis 3. Two areas of abnormal increased T2 signal are identified within segment 8/4 and segment 7. The enhancement characteristics of these lesions are difficult to assess due to abnormal motion artifact on the postcontrast images. As mentioned on exam from 04/20/2019 these are new when compared with 01/20/2019. Metastatic disease cannot be excluded. 4. Mild diffuse pancreatic edema compatible with uncomplicated acute pancreatitis. No pancreatic necrosis, main duct dilatation or pseudocyst identified at this time. 5. No MRI correlate to the suspicious right kidney lesion identified on recent CT. This remains indeterminate and may not be seen due to motion artifact on today's study. Recommend follow-up imaging after resolution of this acute episode. Ideally this should be performed as an outpatient when the patient is able to lay motionless and breath hold. 6. These results will be called to the ordering clinician or representative by the Radiologist Assistant, and communication documented in the PACS or [employer]. Electronically Signed   By: Kerby Moors M.D.   On: 04/26/2019 19:40   DG CHEST PORT 1 VIEW  Result Date: 04/28/2019 CLINICAL DATA:  Postop bronchoscopy. EXAM: PORTABLE CHEST 1 VIEW COMPARISON:  Chest radiograph and CT 04/20/2019 FINDINGS: No visualized pneumothorax post biopsy. Nodule in the left mid lung again seen. Nodular opacities in the right suprahilar region correspond to nodules on CT. Mild cardiomegaly. Unchanged mediastinal contours with aortic atherosclerosis. No significant pleural effusion. There is mild peribronchial thickening. IMPRESSION: 1. No  evidence of pneumothorax post biopsy. 2. Left midlung nodule and right suprahilar nodules, as seen on recent CT. Aortic Atherosclerosis (ICD10-I70.0). Electronically Signed   By: Keith Rake M.D.   On: 04/28/2019 17:19   DG Chest Port 1 View  Result Date: 04/25/2019 CLINICAL DATA:  Chest pain and shortness of breath. EXAM: PORTABLE CHEST 1 VIEW COMPARISON:  Chest plain film and chest CT, dated April 20, 2019, are available for comparison. FINDINGS: A stable patchy area of increased opacification is again seen within the mid left lung. A stable similar-appearing area is seen overlying the upper right lung. There is no evidence of a pleural effusion or pneumothorax. The heart size and mediastinal contours are within normal limits. The visualized skeletal structures are unremarkable. IMPRESSION: Stable patchy opacities within the mid left lung and right upper lobe, consistent with the bilateral lung masses seen within these regions on the prior chest CT dated April 20, 2019. Electronically Signed   By: Virgina Norfolk M.D.   On: 04/25/2019 18:09   DG ERCP BILIARY & PANCREATIC DUCTS  Result Date: 04/27/2019 CLINICAL DATA:  ERCP with sphincterotomy and balloon extraction. EXAM: ERCP TECHNIQUE: Multiple spot images obtained with the fluoroscopic device and submitted for interpretation post-procedure. COMPARISON:  MRCP - 04/26/2019 FLUOROSCOPY TIME:  4 minutes, 28 seconds FINDINGS: Twenty spot intraoperative fluoroscopic images of the right upper abdominal quadrant during ERCP are provided for review Initial image demonstrates an ERCP probe overlying the right upper abdominal quadrant Subsequent images demonstrate selective cannulation and opacification of the common bile duct which appears moderately dilated. There are 4 nonocclusive filling defects within the distal aspect of the CBD (image 4) compatible with choledocholithiasis demonstrated on preceding MRCP. Subsequent images demonstrate insufflation of a  balloon within the central/mid aspect of the CBD with subsequent biliary sweeping and presumed sphincterotomy. There is minimal opacification of the intrahepatic biliary tree which appears nondilated. There is  no definitive opacification of either the cystic or pancreatic ducts. IMPRESSION: ERCP with findings of choledocholithiasis with subsequent biliary sweeping and sphincterotomy. These images were submitted for radiologic interpretation only. Please see the procedural report for the amount of contrast and the fluoroscopy time utilized. Electronically Signed   By: Sandi Mariscal M.D.   On: 04/27/2019 11:32   MR ABDOMEN MRCP W WO CONTAST  Result Date: 04/26/2019 CLINICAL DATA:  Evaluate for choledocholithiasis. EXAM: MRI ABDOMEN WITHOUT AND WITH CONTRAST (INCLUDING MRCP) TECHNIQUE: Multiplanar multisequence MR imaging of the abdomen was performed both before and after the administration of intravenous contrast. Heavily T2-weighted images of the biliary and pancreatic ducts were obtained, and three-dimensional MRCP images were rendered by post processing. CONTRAST:  62mL GADAVIST GADOBUTROL 1 MMOL/ML IV SOLN COMPARISON:  CT AP 04/20/2019 FINDINGS: Lower chest: Trace bilateral pleural effusions. Hepatobiliary: Motion artifact diminishes exam detail within the liver. Corresponding to the CT abnormality is a focal area of increased T2 signal with restricted diffusion within segment 8/4. This measures 1.5 cm. A second more subtle area of increased T2 signal is noted within segment 7, image 12/3 and image 57/5. Enhancement characteristics of both of these lesions are difficult to assess due motion artifact. There is mild diffuse gallbladder wall thickening. Sludge is identified within the neck of the gallbladder. The common bile duct measures 1.1 cm in diameter. At least 4 stones are identified within the distal common bile duct which measure up to 7 mm, image 18/8. Pancreas: Mild diffuse edema is identified. No main  duct dilatation or mass identified. No pancreatic necrosis or pseudocyst formation. Spleen:  Within normal limits in size and appearance. Adrenals/Urinary Tract:  Normal appearance of the adrenal glands. No hydronephrosis identified bilaterally. Bilateral perinephric fat stranding is identified, nonspecific in may be related to prior insult. Several T2 hyperintense kidney lesions are identified. The largest arises from the posterior cortex of the upper pole of left kidney measuring 4.5 cm and is compatible with a simple cyst. No solid enhancing mass or hydronephrosis identified bilaterally. No abnormal signal in or enhancement noted in the region of the recently characterized 10 mm right mid pole lesion. Stomach/Bowel: Visualized portions within the abdomen are unremarkable. Vascular/Lymphatic: Aortic atherosclerosis. No aneurysm. The portal vein remains patent. The splenic vein is also patent. No adenopathy Other:  No free fluid or fluid collections. Musculoskeletal: No suspicious bone lesions identified. IMPRESSION: 1. Exam is positive for choledocholithiasis. At least 4 stones are noted within the distal measuring up to 7 mm. There is increase caliber of the CBD which measures 1.1 cm in maximum dimension. 2. Gallbladder sludge and mild diffuse gallbladder wall thickening. Cannot rule out cholecystitis 3. Two areas of abnormal increased T2 signal are identified within segment 8/4 and segment 7. The enhancement characteristics of these lesions are difficult to assess due to abnormal motion artifact on the postcontrast images. As mentioned on exam from 04/20/2019 these are new when compared with 01/20/2019. Metastatic disease cannot be excluded. 4. Mild diffuse pancreatic edema compatible with uncomplicated acute pancreatitis. No pancreatic necrosis, main duct dilatation or pseudocyst identified at this time. 5. No MRI correlate to the suspicious right kidney lesion identified on recent CT. This remains  indeterminate and may not be seen due to motion artifact on today's study. Recommend follow-up imaging after resolution of this acute episode. Ideally this should be performed as an outpatient when the patient is able to lay motionless and breath hold. 6. These results will be called to the  ordering clinician or representative by the Radiologist Assistant, and communication documented in the PACS or [employer]. Electronically Signed   By: Kerby Moors M.D.   On: 04/26/2019 19:40   ECHOCARDIOGRAM COMPLETE  Result Date: 04/21/2019    ECHOCARDIOGRAM REPORT   Patient Name:   Dennis Stephens Date of Exam: 04/21/2019 Medical Rec #:  [id number]       Height:       69.0 in Accession #:    [id number]      Weight:       213.0 lb Date of Birth:  1933/03/19       BSA:          2.122 m Patient Age:    11 years        BP:           159/76 mmHg Patient Gender: M               HR:           98 bpm. Exam Location:  Inpatient Procedure: 2D Echo, Cardiac Doppler, Color Doppler and Intracardiac            Opacification Agent Indications:    Pulmonary embolus 415.19  History:        Patient has no prior history of Echocardiogram examinations.                 Risk Factors:Hypertension and Former Smoker. Pulmonary embolus.  Sonographer:    Vickie Epley RDCS Referring Phys: [place]  Sonographer Comments: Technically difficult study due to poor echo windows. IMPRESSIONS  1. Left ventricular ejection fraction, by estimation, is 55 to 60%. The left ventricle has normal function. The left ventricle has no regional wall motion abnormalities. Left ventricular diastolic parameters are consistent with Grade I diastolic dysfunction (impaired relaxation).  2. RV not well visualized. Appears to have low normal function. There is ventricular septal dyssynchrony - may be due to bundle branch block. Negative McConnell's sign.. Right ventricular systolic function is low normal. The right ventricular size is normal.  3. The  mitral valve is grossly normal. Trivial mitral valve regurgitation.  4. The aortic valve is tricuspid. Aortic valve regurgitation is not visualized.  5. The inferior vena cava is dilated in size with >50% respiratory variability, suggesting right atrial pressure of 8 mmHg. FINDINGS  Left Ventricle: Left ventricular ejection fraction, by estimation, is 55 to 60%. The left ventricle has normal function. The left ventricle has no regional wall motion abnormalities. Definity contrast agent was given IV to delineate the left ventricular  endocardial borders. The left ventricular internal cavity size was normal in size. There is no left ventricular hypertrophy. Abnormal (paradoxical) septal motion, consistent with left bundle branch block. Left ventricular diastolic parameters are consistent with Grade I diastolic dysfunction (impaired relaxation). Indeterminate filling pressures. Right Ventricle: RV not well visualized. Appears to have low normal function. There is ventricular septal dyssynchrony - may be due to bundle branch block. Negative McConnell's sign. The right ventricular size is normal. No increase in right ventricular wall thickness. Right ventricular systolic function is low normal. Left Atrium: Left atrial size was normal in size. Right Atrium: Right atrial size was normal in size. Pericardium: Trivial pericardial effusion is present. The pericardial effusion is localized near the right ventricle. Mitral Valve: The mitral valve is grossly normal. Trivial mitral valve regurgitation. Tricuspid Valve: The tricuspid valve is not well visualized. Tricuspid valve regurgitation is not demonstrated. Aortic Valve: The aortic  valve is tricuspid. Aortic valve regurgitation is not visualized. Pulmonic Valve: The pulmonic valve was grossly normal. Pulmonic valve regurgitation is not visualized. Aorta: The aortic root, ascending aorta, aortic arch and descending aorta are all structurally normal, with no evidence of  dilitation or obstruction. Venous: The inferior vena cava is dilated in size with greater than 50% respiratory variability, suggesting right atrial pressure of 8 mmHg. IAS/Shunts: No atrial level shunt detected by color flow Doppler.  LEFT VENTRICLE PLAX 2D LVIDd:         5.30 cm  Diastology LVIDs:         3.70 cm  LV e' lateral:   7.40 cm/s LV PW:         1.00 cm  LV E/e' lateral: 10.5 LV IVS:        1.00 cm  LV e' medial:    5.98 cm/s LVOT diam:     2.10 cm  LV E/e' medial:  13.0 LV SV:         67 LV SV Index:   32 LVOT Area:     3.46 cm  RIGHT VENTRICLE RV S prime:     10.60 cm/s TAPSE (M-mode): 1.9 cm LEFT ATRIUM             Index LA diam:        3.80 cm 1.79 cm/m LA Vol (A2C):   42.8 ml 20.17 ml/m LA Vol (A4C):   42.4 ml 19.98 ml/m LA Biplane Vol: 44.3 ml 20.88 ml/m  AORTIC VALVE LVOT Vmax:   107.00 cm/s LVOT Vmean:  81.100 cm/s LVOT VTI:    0.194 m  AORTA Ao Root diam: 3.50 cm MITRAL VALVE MV Area (PHT): 4.63 cm     SHUNTS MV Decel Time: 164 msec     Systemic VTI:  0.19 m MV E velocity: 77.70 cm/s   Systemic Diam: 2.10 cm MV A velocity: 108.00 cm/s MV E/A ratio:  0.72 Lyman Bishop MD Electronically signed by Lyman Bishop MD Signature Date/Time: 04/21/2019/10:53:47 AM    Final    VAS [place] LOWER EXTREMITY VENOUS (DVT)  Result Date: 04/21/2019  Lower Venous DVTStudy Indications: Pulmonary embolism.  Comparison Study: no prior Performing Technologist: [name] RVS  Examination Guidelines: A complete evaluation includes B-mode imaging, spectral Doppler, color Doppler, and power Doppler as needed of all accessible portions of each vessel. Bilateral testing is considered an integral part of a complete examination. Limited examinations for reoccurring indications may be performed as noted. The reflux portion of the exam is performed with the patient in reverse Trendelenburg.  +---------+---------------+---------+-----------+----------+--------------+ RIGHT     CompressibilityPhasicitySpontaneityPropertiesThrombus Aging +---------+---------------+---------+-----------+----------+--------------+ CFV      Full           Yes      Yes                                 +---------+---------------+---------+-----------+----------+--------------+ SFJ      Full                                                        +---------+---------------+---------+-----------+----------+--------------+ FV Prox  Full                                                        +---------+---------------+---------+-----------+----------+--------------+  FV Mid   Full                                                        +---------+---------------+---------+-----------+----------+--------------+ FV DistalFull                                                        +---------+---------------+---------+-----------+----------+--------------+ PFV      Full                                                        +---------+---------------+---------+-----------+----------+--------------+ POP      Full           Yes      Yes                                 +---------+---------------+---------+-----------+----------+--------------+ PTV      Full                                                        +---------+---------------+---------+-----------+----------+--------------+ PERO                                                  Not visualized +---------+---------------+---------+-----------+----------+--------------+   +---------+---------------+---------+-----------+----------+--------------+ LEFT     CompressibilityPhasicitySpontaneityPropertiesThrombus Aging +---------+---------------+---------+-----------+----------+--------------+ CFV      Full           Yes      Yes                                 +---------+---------------+---------+-----------+----------+--------------+ SFJ      Full                                                         +---------+---------------+---------+-----------+----------+--------------+ FV Prox  Full                                                        +---------+---------------+---------+-----------+----------+--------------+ FV Mid   Full                                                        +---------+---------------+---------+-----------+----------+--------------+  FV DistalFull                                                        +---------+---------------+---------+-----------+----------+--------------+ PFV      Full                                                        +---------+---------------+---------+-----------+----------+--------------+ POP      Full           Yes      Yes                                 +---------+---------------+---------+-----------+----------+--------------+ PTV                                                   Not visualized +---------+---------------+---------+-----------+----------+--------------+ PERO                                                  Not visualized +---------+---------------+---------+-----------+----------+--------------+     Summary: BILATERAL: - No evidence of deep vein thrombosis seen in the lower extremities, bilaterally.   *See table(s) above for measurements and observations. Electronically signed by Curt Jews MD on 04/21/2019 at 2:50:03 PM.    Final    US Abdomen Limited RUQ  Result Date: 04/26/2019 CLINICAL DATA:  Pancreatitis.  Hypertension.  Diabetes. EXAM: ULTRASOUND ABDOMEN LIMITED RIGHT UPPER QUADRANT COMPARISON:  04/20/2019 abdominopelvic CT FINDINGS: Gallbladder: No gallstones or wall thickening visualized. No sonographic Murphy sign noted by sonographer. Common bile duct: Diameter: Dilated, including at 1.3 cm. compared to the recent CT, common duct stone or stones are present distally. Liver: Central right hepatic lobe hypoechoic lesion of 2.0 cm, as on CT. Portal vein is  patent on color Doppler imaging with normal direction of blood flow towards the liver. Other: No ascites. IMPRESSION: 1. Normal gallbladder. 2. Biliary duct dilatation with choledocholithiasis. Consider further evaluation with ERCP. 3. Right hepatic lobe lesion as on CT. These results will be called to the ordering clinician or representative by the Radiologist Assistant, and communication documented in the PACS or [employer]. Electronically Signed   By: Abigail Miyamoto M.D.   On: 04/26/2019 07:16   DG C-ARM BRONCHOSCOPY  Result Date: 04/28/2019 C-ARM BRONCHOSCOPY: Fluoroscopy was utilized by the requesting physician.  No radiographic interpretation.     DG Chest 2 View  Result Date: 04/20/2019 CLINICAL DATA:  Chest pain for several hours, history of lung carcinoma EXAM: CHEST - 2 VIEW COMPARISON:  01/20/2019 CT FINDINGS: Cardiac shadow is within normal limits. There is a rounded nodular density identified in the left lung measuring 3.3 cm which corresponds to that seen on the prior exam at which time it measured 1.8 cm. Patchy changes are noted in the right upper lobe also increased when compared with the prior exam.  No sizable effusion is noted. No bony abnormality is noted. IMPRESSION: Enlarging bilateral upper lobe nodules as described. This is consistent with the patient's given clinical history Electronically Signed   By: Inez Catalina M.D.   On: 04/20/2019 21:56   CT Head Wo Contrast  Result Date: 04/21/2019 CLINICAL DATA:  86 year old male with pulmonary emboli, malignant appearing mediastinal lymphadenopathy and liver lesion on CT yesterday. Query brain Mets. EXAM: CT HEAD WITHOUT CONTRAST TECHNIQUE: Contiguous axial images were obtained from the base of the skull through the vertex without intravenous contrast. COMPARISON:  No prior head imaging. FINDINGS: Brain: IV contrast was administered for chest abdomen and pelvis CT 1 hour before these images. Patchy bilateral white matter  hypodensity, but no areas suspicious for vasogenic edema. No midline shift, mass effect, or evidence of intracranial mass lesion. No abnormal enhancement identified. No ventriculomegaly. No cortical encephalomalacia identified. No cortically based acute infarct identified. There is a small hypodensity in the posterior right cerebellum on series 2, image 7, probably a small chronic cerebellar infarct. Vascular: Some residual intravascular contrast is present as above. The major intracranial vascular structures seem to be enhancing as expected. Skull: No acute or suspicious osseous lesion identified. Sinuses/Orbits: Visualized paranasal sinuses and mastoids are clear. Other: No acute orbit or scalp soft tissue findings. IMPRESSION: 1. No metastatic disease to the brain is identified by CT with very delayed contrast timing. MRI Head without and with contrast would be most sensitive. 2. No acute intracranial abnormality identified. 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 Electronically Signed   By: Genevie Ann M.D.   On: 04/21/2019 01:08   CT Angio Chest PE W and/or Wo Contrast  Result Date: 04/21/2019 CLINICAL DATA:  Non-specific chest pain and abdominal pain since 1500 hours. History of hypertension and atrial fibrillation. EXAM: CT ANGIOGRAPHY CHEST CT ABDOMEN AND PELVIS WITH CONTRAST TECHNIQUE: Multidetector CT imaging of the chest was performed using the standard protocol during bolus administration of intravenous contrast. Multiplanar CT image reconstructions and MIPs were obtained to evaluate the vascular anatomy. Multidetector CT imaging of the abdomen and pelvis was performed using the standard protocol during bolus administration of intravenous contrast. Multidetector CT imaging of the chest was performed using the standard protocol during bolus administration of intravenous contrast. Multiplanar CT image reconstructions and MIPs were  obtained to evaluate the vascular anatomy. Automatic exposure control utilized. CONTRAST:  126mL OMNIPAQUE IOHEXOL 350 MG/ML SOLN COMPARISON:  January 20, 2019. FINDINGS: CTA CHEST FINDINGS Cardiovascular: Pulmonary emboli in the bilateral proximal lobar pulmonary arteries and more peripheral bilateral pulmonary arteries. No central pulmonary artery embolism. Right ventricular to left ventricular ratio 0.7; no right heart strain. Mild cardiomegaly with fatty hypertrophy of the inter atrial septum and four-vessel moderate to severe coronary calcification. Mediastinum/Nodes: Metastatic appearing right paratracheal conglomerate adenopathy measuring 3.2 by 2.4 cm. Metastatic-appearing superior mediastinal lymph node measuring 23 by 16 mm. Metastatic appearing right hilar lymph node measuring 16 by 22 mm. Additional smaller mediastinal and bilateral hilar lymph nodes that could be metastatic or benign. Small hiatal hernia. Lungs/Pleura: No pleural effusion. Bilateral spiculated upper lobe pulmonary nodules measuring 2.4 cm diameter in the left upper lobe on series 2, image 39, and 2.6 x 2.0 cm in the right upper lobe on image 26. Both nodules have increased in size is since December 2020 at which time they measured 19 x 18 mm on the left, and 8 mm diameter on the right. Respiratory  motion artifact Upper Abdomen: Reported on concomitant CT abdomen and pelvis reported above. Musculoskeletal: Bone demineralization. Moderate skeletal degenerative change. A 6 mm sclerotic lesion in the T12 vertebral body stable compared to December 2020. Review of the MIP images confirms the above findings. CT ABDOMEN and PELVIS FINDINGS Hepatobiliary: A new 14 mm hypoattenuating lesion in the right hemi liver, highly worrisome for metastasis. Chronic mild intrahepatic and 14 mm proximal common bile duct dilatation with normal distal tapering. Normal gallbladder. Pancreas: Normal. Spleen: Normal. Adrenals/Urinary Tract: Normal bilateral  adrenal glands. Bilateral medical renal disease and simple appearing renal cortical cysts measuring up to 4.4 cm on the right. No hydronephrosis or nephroureterolithiasis bilaterally. No apparent urinary bladder abnormality. A dense 10 mm right renal midpole hypoattenuation. Stomach/Bowel: No bowel obstruction. Moderate stool burden and diverticulosis coli without an acute diverticulitis or apparent bowel wall thickening. Normal appendix, axial image 60. Vascular/Lymphatic: Aortic bilateral iliac in major abdominal artery calcified atherosclerosis with probably clinically significant 75% narrowing of the left common iliac artery. No abdominal aortic aneurysm. Small nonspecific retrocrural, gastrohepatic ligament and retroperitoneal lymph nodes. A mildly enlarged periportal lymph node measuring 12 x 16 mm. No abdominal aortic dissection. Reproductive: Mild prostatomegaly. Other: None. Musculoskeletal: Moderate skeletal degenerative change. A 9 mm ovoid sclerotic lesion in the right proximal femoral shaft, unchanged. Review of the MIP images confirms the above findings. I discussed critical Value/emergent results by telephone at the time of interpretation on 04/21/2019 at 12:12 am with provider Providence Lanius , who verbally acknowledged these results. IMPRESSION: Acute bilateral lobar and peripheral pulmonary emboli without right heart strain. No thoracoabdominal aortic dissection. Suspicious bilateral upper lobe solid pulmonary nodules, both increased in size since December 2020. Histopathological correlation recommended. An embolic or infectious etiology are less likely differential considerations. Malignant-appearing mediastinal and right hilar adenopathy. Enlarged nonspecific gastrohepatic node. Additional small nonspecific lymph nodes above and below diaphragm which could be metastatic or benign. New hepatic lesion concerning for metastatic disease. Normal bilateral adrenal glands. A new 10 mm dense right renal  midpole lesion; renal ultrasonography recommended. A proteinaceous or hemorrhagic cortical cyst or malignant renal lesion are differential considerations. Mild cardiomegaly with four-vessel moderate to severe coronary calcification. Small hiatal hernia. Stable 6 mm T12 and 9 mm right proximal femoral shaft sclerotic lesions since December 2020. Thoracoabdominal aorta and major abdominal artery calcified atherosclerosis without aneurysm. Probably clinically significant 75% atherosclerotic narrowing of the right common iliac artery. Diverticulosis coli. Chronic biliary dilatation. Electronically Signed   By: Revonda Humphrey   On: 04/21/2019 00:17   CT ABDOMEN PELVIS W CONTRAST  Result Date: 04/21/2019 CLINICAL DATA:  Non-specific chest pain and abdominal pain since 1500 hours. History of hypertension and atrial fibrillation. EXAM: CT ANGIOGRAPHY CHEST CT ABDOMEN AND PELVIS WITH CONTRAST TECHNIQUE: Multidetector CT imaging of the chest was performed using the standard protocol during bolus administration of intravenous contrast. Multiplanar CT image reconstructions and MIPs were obtained to evaluate the vascular anatomy. Multidetector CT imaging of the abdomen and pelvis was performed using the standard protocol during bolus administration of intravenous contrast. Multidetector CT imaging of the chest was performed using the standard protocol during bolus administration of intravenous contrast. Multiplanar CT image reconstructions and MIPs were obtained to evaluate the vascular anatomy. Automatic exposure control utilized. CONTRAST:  166mL OMNIPAQUE IOHEXOL 350 MG/ML SOLN COMPARISON:  January 20, 2019. FINDINGS: CTA CHEST FINDINGS Cardiovascular: Pulmonary emboli in the bilateral proximal lobar pulmonary arteries and more peripheral bilateral pulmonary arteries. No central pulmonary artery embolism. Right  ventricular to left ventricular ratio 0.7; no right heart strain. Mild cardiomegaly with fatty hypertrophy of the  inter atrial septum and four-vessel moderate to severe coronary calcification. Mediastinum/Nodes: Metastatic appearing right paratracheal conglomerate adenopathy measuring 3.2 by 2.4 cm. Metastatic-appearing superior mediastinal lymph node measuring 23 by 16 mm. Metastatic appearing right hilar lymph node measuring 16 by 22 mm. Additional smaller mediastinal and bilateral hilar lymph nodes that could be metastatic or benign. Small hiatal hernia. Lungs/Pleura: No pleural effusion. Bilateral spiculated upper lobe pulmonary nodules measuring 2.4 cm diameter in the left upper lobe on series 2, image 39, and 2.6 x 2.0 cm in the right upper lobe on image 26. Both nodules have increased in size is since December 2020 at which time they measured 19 x 18 mm on the left, and 8 mm diameter on the right. Respiratory motion artifact Upper Abdomen: Reported on concomitant CT abdomen and pelvis reported above. Musculoskeletal: Bone demineralization. Moderate skeletal degenerative change. A 6 mm sclerotic lesion in the T12 vertebral body stable compared to December 2020. Review of the MIP images confirms the above findings. CT ABDOMEN and PELVIS FINDINGS Hepatobiliary: A new 14 mm hypoattenuating lesion in the right hemi liver, highly worrisome for metastasis. Chronic mild intrahepatic and 14 mm proximal common bile duct dilatation with normal distal tapering. Normal gallbladder. Pancreas: Normal. Spleen: Normal. Adrenals/Urinary Tract: Normal bilateral adrenal glands. Bilateral medical renal disease and simple appearing renal cortical cysts measuring up to 4.4 cm on the right. No hydronephrosis or nephroureterolithiasis bilaterally. No apparent urinary bladder abnormality. A dense 10 mm right renal midpole hypoattenuation. Stomach/Bowel: No bowel obstruction. Moderate stool burden and diverticulosis coli without an acute diverticulitis or apparent bowel wall thickening. Normal appendix, axial image 60. Vascular/Lymphatic: Aortic  bilateral iliac in major abdominal artery calcified atherosclerosis with probably clinically significant 75% narrowing of the left common iliac artery. No abdominal aortic aneurysm. Small nonspecific retrocrural, gastrohepatic ligament and retroperitoneal lymph nodes. A mildly enlarged periportal lymph node measuring 12 x 16 mm. No abdominal aortic dissection. Reproductive: Mild prostatomegaly. Other: None. Musculoskeletal: Moderate skeletal degenerative change. A 9 mm ovoid sclerotic lesion in the right proximal femoral shaft, unchanged. Review of the MIP images confirms the above findings. I discussed critical Value/emergent results by telephone at the time of interpretation on 04/21/2019 at 12:12 am with provider Providence Lanius , who verbally acknowledged these results. IMPRESSION: Acute bilateral lobar and peripheral pulmonary emboli without right heart strain. No thoracoabdominal aortic dissection. Suspicious bilateral upper lobe solid pulmonary nodules, both increased in size since December 2020. Histopathological correlation recommended. An embolic or infectious etiology are less likely differential considerations. Malignant-appearing mediastinal and right hilar adenopathy. Enlarged nonspecific gastrohepatic node. Additional small nonspecific lymph nodes above and below diaphragm which could be metastatic or benign. New hepatic lesion concerning for metastatic disease. Normal bilateral adrenal glands. A new 10 mm dense right renal midpole lesion; renal ultrasonography recommended. A proteinaceous or hemorrhagic cortical cyst or malignant renal lesion are differential considerations. Mild cardiomegaly with four-vessel moderate to severe coronary calcification. Small hiatal hernia. Stable 6 mm T12 and 9 mm right proximal femoral shaft sclerotic lesions since December 2020. Thoracoabdominal aorta and major abdominal artery calcified atherosclerosis without aneurysm. Probably clinically significant 75%  atherosclerotic narrowing of the right common iliac artery. Diverticulosis coli. Chronic biliary dilatation. Electronically Signed   By: Revonda Humphrey   On: 04/21/2019 00:17   MR 3D Recon At Scanner  Result Date: 04/26/2019 CLINICAL DATA:  Evaluate for choledocholithiasis. EXAM: MRI ABDOMEN  WITHOUT AND WITH CONTRAST (INCLUDING MRCP) TECHNIQUE: Multiplanar multisequence MR imaging of the abdomen was performed both before and after the administration of intravenous contrast. Heavily T2-weighted images of the biliary and pancreatic ducts were obtained, and three-dimensional MRCP images were rendered by post processing. CONTRAST:  3mL GADAVIST GADOBUTROL 1 MMOL/ML IV SOLN COMPARISON:  CT AP 04/20/2019 FINDINGS: Lower chest: Trace bilateral pleural effusions. Hepatobiliary: Motion artifact diminishes exam detail within the liver. Corresponding to the CT abnormality is a focal area of increased T2 signal with restricted diffusion within segment 8/4. This measures 1.5 cm. A second more subtle area of increased T2 signal is noted within segment 7, image 12/3 and image 57/5. Enhancement characteristics of both of these lesions are difficult to assess due motion artifact. There is mild diffuse gallbladder wall thickening. Sludge is identified within the neck of the gallbladder. The common bile duct measures 1.1 cm in diameter. At least 4 stones are identified within the distal common bile duct which measure up to 7 mm, image 18/8. Pancreas: Mild diffuse edema is identified. No main duct dilatation or mass identified. No pancreatic necrosis or pseudocyst formation. Spleen:  Within normal limits in size and appearance. Adrenals/Urinary Tract:  Normal appearance of the adrenal glands. No hydronephrosis identified bilaterally. Bilateral perinephric fat stranding is identified, nonspecific in may be related to prior insult. Several T2 hyperintense kidney lesions are identified. The largest arises from the posterior cortex of  the upper pole of left kidney measuring 4.5 cm and is compatible with a simple cyst. No solid enhancing mass or hydronephrosis identified bilaterally. No abnormal signal in or enhancement noted in the region of the recently characterized 10 mm right mid pole lesion. Stomach/Bowel: Visualized portions within the abdomen are unremarkable. Vascular/Lymphatic: Aortic atherosclerosis. No aneurysm. The portal vein remains patent. The splenic vein is also patent. No adenopathy Other:  No free fluid or fluid collections. Musculoskeletal: No suspicious bone lesions identified. IMPRESSION: 1. Exam is positive for choledocholithiasis. At least 4 stones are noted within the distal measuring up to 7 mm. There is increase caliber of the CBD which measures 1.1 cm in maximum dimension. 2. Gallbladder sludge and mild diffuse gallbladder wall thickening. Cannot rule out cholecystitis 3. Two areas of abnormal increased T2 signal are identified within segment 8/4 and segment 7. The enhancement characteristics of these lesions are difficult to assess due to abnormal motion artifact on the postcontrast images. As mentioned on exam from 04/20/2019 these are new when compared with 01/20/2019. Metastatic disease cannot be excluded. 4. Mild diffuse pancreatic edema compatible with uncomplicated acute pancreatitis. No pancreatic necrosis, main duct dilatation or pseudocyst identified at this time. 5. No MRI correlate to the suspicious right kidney lesion identified on recent CT. This remains indeterminate and may not be seen due to motion artifact on today's study. Recommend follow-up imaging after resolution of this acute episode. Ideally this should be performed as an outpatient when the patient is able to lay motionless and breath hold. 6. These results will be called to the ordering clinician or representative by the Radiologist Assistant, and communication documented in the PACS or [employer]. Electronically Signed   By: Kerby Moors M.D.   On: 04/26/2019 19:40   DG CHEST PORT 1 VIEW  Result Date: 04/28/2019 CLINICAL DATA:  Postop bronchoscopy. EXAM: PORTABLE CHEST 1 VIEW COMPARISON:  Chest radiograph and CT 04/20/2019 FINDINGS: No visualized pneumothorax post biopsy. Nodule in the left mid lung again seen. Nodular opacities in the right suprahilar region  correspond to nodules on CT. Mild cardiomegaly. Unchanged mediastinal contours with aortic atherosclerosis. No significant pleural effusion. There is mild peribronchial thickening. IMPRESSION: 1. No evidence of pneumothorax post biopsy. 2. Left midlung nodule and right suprahilar nodules, as seen on recent CT. Aortic Atherosclerosis (ICD10-I70.0). Electronically Signed   By: Keith Rake M.D.   On: 04/28/2019 17:19   DG Chest Port 1 View  Result Date: 04/25/2019 CLINICAL DATA:  Chest pain and shortness of breath. EXAM: PORTABLE CHEST 1 VIEW COMPARISON:  Chest plain film and chest CT, dated April 20, 2019, are available for comparison. FINDINGS: A stable patchy area of increased opacification is again seen within the mid left lung. A stable similar-appearing area is seen overlying the upper right lung. There is no evidence of a pleural effusion or pneumothorax. The heart size and mediastinal contours are within normal limits. The visualized skeletal structures are unremarkable. IMPRESSION: Stable patchy opacities within the mid left lung and right upper lobe, consistent with the bilateral lung masses seen within these regions on the prior chest CT dated April 20, 2019. Electronically Signed   By: Virgina Norfolk M.D.   On: 04/25/2019 18:09   DG ERCP BILIARY & PANCREATIC DUCTS  Result Date: 04/27/2019 CLINICAL DATA:  ERCP with sphincterotomy and balloon extraction. EXAM: ERCP TECHNIQUE: Multiple spot images obtained with the fluoroscopic device and submitted for interpretation post-procedure. COMPARISON:  MRCP - 04/26/2019 FLUOROSCOPY TIME:  4 minutes, 28 seconds FINDINGS:  Twenty spot intraoperative fluoroscopic images of the right upper abdominal quadrant during ERCP are provided for review Initial image demonstrates an ERCP probe overlying the right upper abdominal quadrant Subsequent images demonstrate selective cannulation and opacification of the common bile duct which appears moderately dilated. There are 4 nonocclusive filling defects within the distal aspect of the CBD (image 4) compatible with choledocholithiasis demonstrated on preceding MRCP. Subsequent images demonstrate insufflation of a balloon within the central/mid aspect of the CBD with subsequent biliary sweeping and presumed sphincterotomy. There is minimal opacification of the intrahepatic biliary tree which appears nondilated. There is no definitive opacification of either the cystic or pancreatic ducts. IMPRESSION: ERCP with findings of choledocholithiasis with subsequent biliary sweeping and sphincterotomy. These images were submitted for radiologic interpretation only. Please see the procedural report for the amount of contrast and the fluoroscopy time utilized. Electronically Signed   By: Sandi Mariscal M.D.   On: 04/27/2019 11:32   MR ABDOMEN MRCP W WO CONTAST  Result Date: 04/26/2019 CLINICAL DATA:  Evaluate for choledocholithiasis. EXAM: MRI ABDOMEN WITHOUT AND WITH CONTRAST (INCLUDING MRCP) TECHNIQUE: Multiplanar multisequence MR imaging of the abdomen was performed both before and after the administration of intravenous contrast. Heavily T2-weighted images of the biliary and pancreatic ducts were obtained, and three-dimensional MRCP images were rendered by post processing. CONTRAST:  36mL GADAVIST GADOBUTROL 1 MMOL/ML IV SOLN COMPARISON:  CT AP 04/20/2019 FINDINGS: Lower chest: Trace bilateral pleural effusions. Hepatobiliary: Motion artifact diminishes exam detail within the liver. Corresponding to the CT abnormality is a focal area of increased T2 signal with restricted diffusion within segment 8/4.  This measures 1.5 cm. A second more subtle area of increased T2 signal is noted within segment 7, image 12/3 and image 57/5. Enhancement characteristics of both of these lesions are difficult to assess due motion artifact. There is mild diffuse gallbladder wall thickening. Sludge is identified within the neck of the gallbladder. The common bile duct measures 1.1 cm in diameter. At least 4 stones are identified within the distal common  bile duct which measure up to 7 mm, image 18/8. Pancreas: Mild diffuse edema is identified. No main duct dilatation or mass identified. No pancreatic necrosis or pseudocyst formation. Spleen:  Within normal limits in size and appearance. Adrenals/Urinary Tract:  Normal appearance of the adrenal glands. No hydronephrosis identified bilaterally. Bilateral perinephric fat stranding is identified, nonspecific in may be related to prior insult. Several T2 hyperintense kidney lesions are identified. The largest arises from the posterior cortex of the upper pole of left kidney measuring 4.5 cm and is compatible with a simple cyst. No solid enhancing mass or hydronephrosis identified bilaterally. No abnormal signal in or enhancement noted in the region of the recently characterized 10 mm right mid pole lesion. Stomach/Bowel: Visualized portions within the abdomen are unremarkable. Vascular/Lymphatic: Aortic atherosclerosis. No aneurysm. The portal vein remains patent. The splenic vein is also patent. No adenopathy Other:  No free fluid or fluid collections. Musculoskeletal: No suspicious bone lesions identified. IMPRESSION: 1. Exam is positive for choledocholithiasis. At least 4 stones are noted within the distal measuring up to 7 mm. There is increase caliber of the CBD which measures 1.1 cm in maximum dimension. 2. Gallbladder sludge and mild diffuse gallbladder wall thickening. Cannot rule out cholecystitis 3. Two areas of abnormal increased T2 signal are identified within segment 8/4 and  segment 7. The enhancement characteristics of these lesions are difficult to assess due to abnormal motion artifact on the postcontrast images. As mentioned on exam from 04/20/2019 these are new when compared with 01/20/2019. Metastatic disease cannot be excluded. 4. Mild diffuse pancreatic edema compatible with uncomplicated acute pancreatitis. No pancreatic necrosis, main duct dilatation or pseudocyst identified at this time. 5. No MRI correlate to the suspicious right kidney lesion identified on recent CT. This remains indeterminate and may not be seen due to motion artifact on today's study. Recommend follow-up imaging after resolution of this acute episode. Ideally this should be performed as an outpatient when the patient is able to lay motionless and breath hold. 6. These results will be called to the ordering clinician or representative by the Radiologist Assistant, and communication documented in the PACS or [employer]. Electronically Signed   By: Kerby Moors M.D.   On: 04/26/2019 19:40   ECHOCARDIOGRAM COMPLETE  Result Date: 04/21/2019    ECHOCARDIOGRAM REPORT   Patient Name:   Dennis Stephens Date of Exam: 04/21/2019 Medical Rec #:  [id number]       Height:       69.0 in Accession #:    [id number]      Weight:       213.0 lb Date of Birth:  08-01-1933       BSA:          2.122 m Patient Age:    24 years        BP:           159/76 mmHg Patient Gender: M               HR:           98 bpm. Exam Location:  Inpatient Procedure: 2D Echo, Cardiac Doppler, Color Doppler and Intracardiac            Opacification Agent Indications:    Pulmonary embolus 415.19  History:        Patient has no prior history of Echocardiogram examinations.                 Risk Factors:Hypertension and Former Smoker.  Pulmonary embolus.  Sonographer:    Vickie Epley RDCS Referring Phys: [place]  Sonographer Comments: Technically difficult study due to poor echo windows. IMPRESSIONS  1. Left ventricular  ejection fraction, by estimation, is 55 to 60%. The left ventricle has normal function. The left ventricle has no regional wall motion abnormalities. Left ventricular diastolic parameters are consistent with Grade I diastolic dysfunction (impaired relaxation).  2. RV not well visualized. Appears to have low normal function. There is ventricular septal dyssynchrony - may be due to bundle branch block. Negative McConnell's sign.. Right ventricular systolic function is low normal. The right ventricular size is normal.  3. The mitral valve is grossly normal. Trivial mitral valve regurgitation.  4. The aortic valve is tricuspid. Aortic valve regurgitation is not visualized.  5. The inferior vena cava is dilated in size with >50% respiratory variability, suggesting right atrial pressure of 8 mmHg. FINDINGS  Left Ventricle: Left ventricular ejection fraction, by estimation, is 55 to 60%. The left ventricle has normal function. The left ventricle has no regional wall motion abnormalities. Definity contrast agent was given IV to delineate the left ventricular  endocardial borders. The left ventricular internal cavity size was normal in size. There is no left ventricular hypertrophy. Abnormal (paradoxical) septal motion, consistent with left bundle branch block. Left ventricular diastolic parameters are consistent with Grade I diastolic dysfunction (impaired relaxation). Indeterminate filling pressures. Right Ventricle: RV not well visualized. Appears to have low normal function. There is ventricular septal dyssynchrony - may be due to bundle branch block. Negative McConnell's sign. The right ventricular size is normal. No increase in right ventricular wall thickness. Right ventricular systolic function is low normal. Left Atrium: Left atrial size was normal in size. Right Atrium: Right atrial size was normal in size. Pericardium: Trivial pericardial effusion is present. The pericardial effusion is localized near the right  ventricle. Mitral Valve: The mitral valve is grossly normal. Trivial mitral valve regurgitation. Tricuspid Valve: The tricuspid valve is not well visualized. Tricuspid valve regurgitation is not demonstrated. Aortic Valve: The aortic valve is tricuspid. Aortic valve regurgitation is not visualized. Pulmonic Valve: The pulmonic valve was grossly normal. Pulmonic valve regurgitation is not visualized. Aorta: The aortic root, ascending aorta, aortic arch and descending aorta are all structurally normal, with no evidence of dilitation or obstruction. Venous: The inferior vena cava is dilated in size with greater than 50% respiratory variability, suggesting right atrial pressure of 8 mmHg. IAS/Shunts: No atrial level shunt detected by color flow Doppler.  LEFT VENTRICLE PLAX 2D LVIDd:         5.30 cm  Diastology LVIDs:         3.70 cm  LV e' lateral:   7.40 cm/s LV PW:         1.00 cm  LV E/e' lateral: 10.5 LV IVS:        1.00 cm  LV e' medial:    5.98 cm/s LVOT diam:     2.10 cm  LV E/e' medial:  13.0 LV SV:         67 LV SV Index:   32 LVOT Area:     3.46 cm  RIGHT VENTRICLE RV S prime:     10.60 cm/s TAPSE (M-mode): 1.9 cm LEFT ATRIUM             Index LA diam:        3.80 cm 1.79 cm/m LA Vol (A2C):   42.8 ml 20.17 ml/m LA Vol (A4C):  42.4 ml 19.98 ml/m LA Biplane Vol: 44.3 ml 20.88 ml/m  AORTIC VALVE LVOT Vmax:   107.00 cm/s LVOT Vmean:  81.100 cm/s LVOT VTI:    0.194 m  AORTA Ao Root diam: 3.50 cm MITRAL VALVE MV Area (PHT): 4.63 cm     SHUNTS MV Decel Time: 164 msec     Systemic VTI:  0.19 m MV E velocity: 77.70 cm/s   Systemic Diam: 2.10 cm MV A velocity: 108.00 cm/s MV E/A ratio:  0.72 Lyman Bishop MD Electronically signed by Lyman Bishop MD Signature Date/Time: 04/21/2019/10:53:47 AM    Final    VAS [place] LOWER EXTREMITY VENOUS (DVT)  Result Date: 04/21/2019  Lower Venous DVTStudy Indications: Pulmonary embolism.  Comparison Study: no prior Performing Technologist: [name] RVS  Examination  Guidelines: A complete evaluation includes B-mode imaging, spectral Doppler, color Doppler, and power Doppler as needed of all accessible portions of each vessel. Bilateral testing is considered an integral part of a complete examination. Limited examinations for reoccurring indications may be performed as noted. The reflux portion of the exam is performed with the patient in reverse Trendelenburg.  +---------+---------------+---------+-----------+----------+--------------+ RIGHT    CompressibilityPhasicitySpontaneityPropertiesThrombus Aging +---------+---------------+---------+-----------+----------+--------------+ CFV      Full           Yes      Yes                                 +---------+---------------+---------+-----------+----------+--------------+ SFJ      Full                                                        +---------+---------------+---------+-----------+----------+--------------+ FV Prox  Full                                                        +---------+---------------+---------+-----------+----------+--------------+ FV Mid   Full                                                        +---------+---------------+---------+-----------+----------+--------------+ FV DistalFull                                                        +---------+---------------+---------+-----------+----------+--------------+ PFV      Full                                                        +---------+---------------+---------+-----------+----------+--------------+ POP      Full           Yes      Yes                                 +---------+---------------+---------+-----------+----------+--------------+  PTV      Full                                                        +---------+---------------+---------+-----------+----------+--------------+ PERO                                                  Not visualized  +---------+---------------+---------+-----------+----------+--------------+   +---------+---------------+---------+-----------+----------+--------------+ LEFT     CompressibilityPhasicitySpontaneityPropertiesThrombus Aging +---------+---------------+---------+-----------+----------+--------------+ CFV      Full           Yes      Yes                                 +---------+---------------+---------+-----------+----------+--------------+ SFJ      Full                                                        +---------+---------------+---------+-----------+----------+--------------+ FV Prox  Full                                                        +---------+---------------+---------+-----------+----------+--------------+ FV Mid   Full                                                        +---------+---------------+---------+-----------+----------+--------------+ FV DistalFull                                                        +---------+---------------+---------+-----------+----------+--------------+ PFV      Full                                                        +---------+---------------+---------+-----------+----------+--------------+ POP      Full           Yes      Yes                                 +---------+---------------+---------+-----------+----------+--------------+ PTV                                                   Not visualized +---------+---------------+---------+-----------+----------+--------------+  PERO                                                  Not visualized +---------+---------------+---------+-----------+----------+--------------+     Summary: BILATERAL: - No evidence of deep vein thrombosis seen in the lower extremities, bilaterally.   *See table(s) above for measurements and observations. Electronically signed by Curt Jews MD on 04/21/2019 at 2:50:03 PM.    Final    US Abdomen Limited  RUQ  Result Date: 04/26/2019 CLINICAL DATA:  Pancreatitis.  Hypertension.  Diabetes. EXAM: ULTRASOUND ABDOMEN LIMITED RIGHT UPPER QUADRANT COMPARISON:  04/20/2019 abdominopelvic CT FINDINGS: Gallbladder: No gallstones or wall thickening visualized. No sonographic Murphy sign noted by sonographer. Common bile duct: Diameter: Dilated, including at 1.3 cm. compared to the recent CT, common duct stone or stones are present distally. Liver: Central right hepatic lobe hypoechoic lesion of 2.0 cm, as on CT. Portal vein is patent on color Doppler imaging with normal direction of blood flow towards the liver. Other: No ascites. IMPRESSION: 1. Normal gallbladder. 2. Biliary duct dilatation with choledocholithiasis. Consider further evaluation with ERCP. 3. Right hepatic lobe lesion as on CT. These results will be called to the ordering clinician or representative by the Radiologist Assistant, and communication documented in the PACS or [employer]. Electronically Signed   By: Abigail Miyamoto M.D.   On: 04/26/2019 07:16   DG C-ARM BRONCHOSCOPY  Result Date: 04/28/2019 C-ARM BRONCHOSCOPY: Fluoroscopy was utilized by the requesting physician.  No radiographic interpretation.   Assessment and Plan:  This is a pleasant 86 year old male with pulmonary nodules highly suspicious for lung cancer.  Biopsies are pending.  He presented with bilateral pulmonary nodules, mediastinal and right hilar adenopathy, hepatic lesion, and lytic lesions at T12 and in the right proximal femoral shaft.  CT scan results were discussed with the patient.  We discussed the potential diagnosis of lung cancer pending biopsy.  The patient is indicated that he is unsure if he would want a pursue treatment if the biopsy does prove this to be lung cancer.  He is agreeable to a more detailed discussion once the biopsy results return.  If he opts to proceed with treatment, he will need additional staging work-up including an MRI of the brain with and  without contrast and a PET scan which will need to be done as an outpatient.  I have not ordered any additional studies at this time and we will determine whether he wishes to proceed with this work-up once we have the biopsy results.  Thank you for this referral.   Mikey Bussing, DNP, AGPCNP-BC, AOCNP  ADDENDUM: Hematology/Oncology Attending: I had a face-to-face encounter with the patient today.  I recommended his care plan and I agree with the above note.  This is a very pleasant 86 years old white male who was admitted with abdominal pain secondary to cholelithiasis.  He was also complaining of shortness of breath and he had CT angiogram of the chest as well as CT scan of the abdomen and pelvis performed recently.  The scan of the chest showed bilateral pulmonary embolism but there was also bilateral lung masses in addition to mediastinal lymphadenopathy and new liver lesion.  He had suspicious pulmonary nodule and previous CT scan of the chest in December 2020 but I do not see any work-up performed at that time.  The patient underwent bronchoscopy under the care of Dr. Valeta Harms and the preliminary diagnosis from the lung mass was consistent with squamous cell carcinoma but the 4R lymph node is suspicious for small cell carcinoma.  Further immunohistochemical stains are currently being performed before the final diagnosis is reported. I had a lengthy discussion with the patient today about his current condition and possible treatment options. I will arrange for the patient to have a follow-up appointment with me at the cancer center after discharge.  I would consider him for a PET scan on outpatient basis.  He may also need MRI of the brain to rule out brain metastasis. For the pulmonary embolism, the patient will continue his current anticoagulation with Xarelto. For the cholelithiasis, the patient underwent ERCP with extraction of the obstructing stone. Thank you so much for taking good care of Mr.  Pieper, will continue to follow up the patient with you and assist in his management on as-needed basis.  Please call if you have any questions.  Disclaimer: This note was dictated with voice recognition software. Similar sounding words can inadvertently be transcribed and may be missed upon review. Eilleen Kempf, MD

## 2019-04-29 NOTE — Progress Notes (Signed)
Pharmacy Antibiotic Note  Dennis Stephens is a 86 y.o. male admitted on 04/25/2019 with IAI.  Pharmacy has been consulted for piperacillin/tazobactam dosing.  Pt admitted with gallstone pancreatitis and choledocholithiasis s/p ERCP on 3/16.   Today, 04/29/19 -WBC 12.2, improving -SCr 1.05, CrCL ~60 mL/min. Improving -Afebrile  Day #3 of full dose antibiotics.  Plan:  Continue piperacillin/tazobactam 3.375 g IV q8h EI  Height: 5\' 9"  (175.3 cm) Weight: 205 lb (93 kg) IBW/kg (Calculated) : 70.7  Temp (24hrs), Avg:97.9 F (36.6 C), Min:97 F (36.1 C), Max:98.1 F (36.7 C)  Recent Labs  Lab 04/25/19 1721 04/26/19 0532 04/27/19 0201 04/27/19 1824 04/28/19 0109  WBC 15.4* 26.5* 15.3* 10.6* 12.2*  CREATININE 1.10 1.55* 1.22 1.11 1.05    Estimated Creatinine Clearance: 60 mL/min (by C-G formula based on SCr of 1.05 mg/dL).    No Known Allergies  Antimicrobials this admission: Piperacillin/tazobactam 3/15 >>   Dose adjustments this admission:  Microbiology results: 3/15 BCx: 1/3 CoNS  Lenis Noon, PharmD 04/29/2019 7:54 AM

## 2019-04-29 NOTE — Progress Notes (Addendum)
PROGRESS NOTE  Dennis Stephens DCV:[id number] DOB: 02-Jul-1933 DOA: 04/25/2019 PCP: [facility]  Brief summary: Patient is a 86 year old male with history of hypertension, A. fib, recent diagnosis of PE, likely metastatic CA with spiculated pulmonary nodules presented with abdominal pain. Patient reported pain 10/10 in upper quadrants, nausea and vomiting.  Patient also reported abdominal swelling, yellowing of his eyes and under the tongue.  Patient was recently admitted (04/20/2019-04/22/2019) for chest pain and was found to have PE for which he was started on Xarelto.  Patient was also found to have hilar and mediastinal lymphadenopathy with possible liver mets, spiculated pulmonary nodules during previous admission, he was scheduled for EBUS and bronchoscopy, declined further work-up inpatient and was discharged at his request.  He is requesting evaluation for hospice and palliative care. In ED, patient was found to have elevated lipase, 2207, elevated AST ALT alk phos 569 with leukocytosis. Abdominal ultrasound showed normal gallbladder, biliary duct dilatation with choledocholithiasis, recommended ERCP. Patient admitted for further management.    Today, patient noted to be coughing with some hemoptysis status post bronchoscopy, some mild wheezing noted.  Patient denies any worsening shortness of breath, chest pain, abdominal pain, nausea/vomiting, fever/chills.    Assessment/Plan: Principal Problem:   Acute pancreatitis Active Problems:   Pulmonary embolism (HCC)   Lung mass   Essential hypertension   Elevated LFTs   Bile duct stone   Acute pancreatitis likely 2/2 choledocholithiasis, acute transaminitis Currently afebrile, with leukocytosis Lipase trending down, 1879-->206 LFTs slowly trending down BC x2 NGTD GI on board, s/p ERCP with sphincterotomy, with CBD stone extraction on 3/16 by GI Dr. Henrene Pastor, GI signed off Continue IV Zosyn Daily CMP  Pulmonary embolism Likely  due to possible underlying malignancy Currently on heparin drip, Xarelto on hold for now  Hilar and mediastinal lymphadenopathy, spiculated pulmonary nodules Seen on CT during previous admission, pulmonology was consulted S/P EBUS on 3/17, by PCCM, noted some intraoperative hemorrhage during procedure, biopsy result pending Oncology consulted, awaiting biopsy, patient unsure if he will proceed with treatment Monitor closely for bleeding  Hyponatremia Likely due to poor p.o. intake, pseudohyponatremia secondary to hyperglycemia, possible SIADH in the setting of cancer D/C IVF Daily CMP  AKI on CKD stage II Improved Creatinine peaked at 1.55, baseline around 1.1 Hold losartan, continue IV hydration Daily CMP  Essential hypertension BP stable Continue Coreg, restart norvasc, will increase to 5 mg Hold losartan for now  Diabetes mellitus type 2, NIDDM Hold Amaryl, continue sliding scale insulin  Tremor Continue primidone, hold if any worsening effects  Obesity Lifestyle modification advised  [facility] team consulted     Body mass index is 30.27 kg/m.    Code Status: DNR DVT Prophylaxis: home meds Xarelto held , on heparin drip   Family Communication: Discussed extensively with patient, spouse and sister at bedside  Disposition Plan:    Patient came from:         home  Anticipated d/c place:  Likely home  Barriers to d/c OR conditions which need to be met to effect a safe d/c: Awaiting sign off from PCCM, palliative, oncology   Consultants:  Pulmonary critical care  GI  Palliative care  Procedures:  ERCP on March 16  Antibiotics:  Zosyn   Objective: BP (!) 153/63 (BP Location: Left Arm)   Pulse 69   Temp 98.1 F (36.7 C) (Oral)   Resp 20   Ht '5\' 9"'  (1.753 m)   Wt 93 kg   SpO2 96%   BMI 30.27 kg/m   Intake/Output Summary  (Last 24 hours) at 04/29/2019 1401 Last data filed at 04/29/2019 1315 Gross per 24 hour  Intake 1594.63 ml  Output 1435 ml  Net 159.63 ml   Filed Weights   04/25/19 1718 04/27/19 0842 04/28/19 1331  Weight: 96 kg 96 kg 93 kg    Exam:  General: NAD   Cardiovascular: S1, S2 present  Respiratory:  Diminished breath sounds bilaterally, wheezing noted bilaterally  Abdomen: Soft, nontender, nondistended, bowel sounds present  Musculoskeletal: No bilateral pedal edema noted  Skin: Normal  Psychiatry: Normal mood    Data Reviewed: Basic Metabolic Panel: Recent Labs  Lab 04/25/19 1721 04/26/19 0532 04/27/19 0201 04/27/19 1824 04/28/19 0109  NA 130* 128* 129* 126* 128*  K 3.8 4.9 4.4 4.9 4.8  CL 96* 98 100 99 102  CO2 22 20* 20* 20* 19*  GLUCOSE 184* 232* 124* 301* 282*  BUN 15 23 27* 25* 27*  CREATININE 1.10 1.55* 1.22 1.11 1.05  CALCIUM 8.7* 8.0* 8.0* 8.2* 8.1*   Liver Function Tests: Recent Labs  Lab 04/25/19 1721 04/26/19 0532 04/27/19 0201 04/27/19 1824 04/28/19 0109  AST 287* 506* 372* 261* 176*  ALT 200* 298* 263* 236* 196*  ALKPHOS 569* 551* 522* 625* 550*  BILITOT 4.3* 6.4* 5.9* 4.1* 2.9*  PROT 6.7 6.0* 5.9* 6.1* 5.5*  ALBUMIN 2.6* 2.3* 2.3* 2.2* 2.0*   Recent Labs  Lab 04/25/19 1721 04/26/19 0532 04/27/19 0201 04/28/19 0109  LIPASE 2,207* 1,879* 1,537* 206*   No results for input(s): AMMONIA in the last 168 hours. CBC: Recent Labs  Lab 04/26/19 0532 04/27/19 0201 04/27/19 1824 04/28/19 0109 04/29/19 0758  WBC 26.5* 15.3* 10.6* 12.2* 12.1*  HGB 12.2* 11.7* 12.1* 11.3* 12.2*  HCT 37.3* 36.0* 37.5* 34.1* 36.2*  MCV 94.2 94.2 94.0 93.7 93.3  PLT 288 262 295 304 358   Cardiac Enzymes:   No results for input(s): CKTOTAL, CKMB, CKMBINDEX, TROPONINI in the last 168 hours. BNP (last 3 results) Recent Labs    04/25/19 1730  BNP 128.3*    ProBNP (last 3 results) No results for input(s): PROBNP in the last 8760 hours.  CBG: Recent  Labs  Lab 04/28/19 2022 04/28/19 2327 04/29/19 0400 04/29/19 0747 04/29/19 1123  GLUCAP 164* 194* 169* 141* 162*    Recent Results (from the past 240 hour(s))  SARS CORONAVIRUS 2 (TAT 6-24 HRS) Nasopharyngeal Nasopharyngeal Swab     Status: None   Collection Time: 04/21/19  7:08 AM   Specimen: Nasopharyngeal Swab  Result Value Ref Range Status   SARS Coronavirus 2 NEGATIVE NEGATIVE Final    Comment: (NOTE) SARS-CoV-2 target nucleic acids are NOT DETECTED. The SARS-CoV-2 RNA is generally detectable in upper and lower respiratory specimens during the acute phase of infection. Negative results do not preclude SARS-CoV-2 infection, do not rule out co-infections with other pathogens, and should not be used as the sole basis for  treatment or other patient management decisions. Negative results must be combined with clinical observations, patient history, and epidemiological information. The expected result is Negative. Fact Sheet for Patients: SugarRoll.be Fact Sheet for Healthcare Providers: https://www.woods-mathews.com/ This test is not yet approved or cleared by the [place] FDA and  has been authorized for detection and/or diagnosis of SARS-CoV-2 by FDA under an Emergency Use Authorization (EUA). This EUA will remain  in effect (meaning this test can be used) for the duration of the COVID-19 declaration under Section 56 4(b)(1) of the Act, 21 U.S.C. section 360bbb-3(b)(1), unless the authorization is terminated or revoked sooner. Performed at [facility], [place] [address], [place], [place] [address]   SARS CORONAVIRUS 2 (TAT 6-24 HRS) Nasopharyngeal Nasopharyngeal Swab     Status: None   Collection Time: 04/25/19  7:59 PM   Specimen: Nasopharyngeal Swab  Result Value Ref Range Status   SARS Coronavirus 2 NEGATIVE NEGATIVE Final    Comment: (NOTE) SARS-CoV-2 target nucleic acids are NOT DETECTED. The SARS-CoV-2 RNA is  generally detectable in upper and lower respiratory specimens during the acute phase of infection. Negative results do not preclude SARS-CoV-2 infection, do not rule out co-infections with other pathogens, and should not be used as the sole basis for treatment or other patient management decisions. Negative results must be combined with clinical observations, patient history, and epidemiological information. The expected result is Negative. Fact Sheet for Patients: SugarRoll.be Fact Sheet for Healthcare Providers: https://www.woods-mathews.com/ This test is not yet approved or cleared by the [place] FDA and  has been authorized for detection and/or diagnosis of SARS-CoV-2 by FDA under an Emergency Use Authorization (EUA). This EUA will remain  in effect (meaning this test can be used) for the duration of the COVID-19 declaration under Section 56 4(b)(1) of the Act, 21 U.S.C. section 360bbb-3(b)(1), unless the authorization is terminated or revoked sooner. Performed at [facility], [place] [address], [place], [place] [address]   Culture, blood (Routine X 2) w Reflex to ID Panel     Status: None (Preliminary result)   Collection Time: 04/26/19  1:02 PM   Specimen: BLOOD  Result Value Ref Range Status   Specimen Description   Final    BLOOD RIGHT ANTECUBITAL Performed at [facility] [address] [place], [place] [address]    Special Requests   Final    BOTTLES DRAWN AEROBIC AND ANAEROBIC Blood Culture adequate volume Performed at [facility] [address], [place], [place] [address]    Culture   Final    NO GROWTH 3 DAYS Performed at [facility], [place] [address] [place], [place] [address]    Report Status PENDING  Incomplete  Culture, blood (Routine X 2) w Reflex to ID Panel     Status: Abnormal   Collection Time: 04/26/19  1:02 PM   Specimen: BLOOD RIGHT HAND  Result Value  Ref Range Status   Specimen Description   Final    BLOOD RIGHT HAND Performed at [facility] [address] [place],  [address]    Special Requests   Final    AEROBIC BOTTLE ONLY Blood Culture results may not be optimal due to an inadequate volume of blood received in culture bottles   Culture  Setup Time   Final    AEROBIC BOTTLE ONLY GRAM POSITIVE COCCI IN CLUSTERS CRITICAL RESULT CALLED TO, READ BACK BY AND VERIFIED WITH: L POINDEXTER PHARMD 04/27/19  1810 JDW    Culture (A)  Final    STAPHYLOCOCCUS SPECIES (COAGULASE NEGATIVE) THE SIGNIFICANCE OF ISOLATING THIS ORGANISM FROM A SINGLE SET OF BLOOD CULTURES WHEN MULTIPLE SETS ARE DRAWN IS UNCERTAIN. PLEASE NOTIFY THE MICROBIOLOGY DEPARTMENT WITHIN ONE WEEK IF SPECIATION AND SENSITIVITIES ARE REQUIRED. Performed at [facility], [place] [address] [place], [place] [address]    Report Status 04/29/2019 FINAL  Final     Studies: DG CHEST PORT 1 VIEW  Result Date: 04/28/2019 CLINICAL DATA:  Postop bronchoscopy. EXAM: PORTABLE CHEST 1 VIEW COMPARISON:  Chest radiograph and CT 04/20/2019 FINDINGS: No visualized pneumothorax post biopsy. Nodule in the left mid lung again seen. Nodular opacities in the right suprahilar region correspond to nodules on CT. Mild cardiomegaly. Unchanged mediastinal contours with aortic atherosclerosis. No significant pleural effusion. There is mild peribronchial thickening. IMPRESSION: 1. No evidence of pneumothorax post biopsy. 2. Left midlung nodule and right suprahilar nodules, as seen on recent CT. Aortic Atherosclerosis (ICD10-I70.0). Electronically Signed   By: Keith Rake M.D.   On: 04/28/2019 17:19   DG C-ARM BRONCHOSCOPY  Result Date: 04/28/2019 C-ARM BRONCHOSCOPY: Fluoroscopy was utilized by the requesting physician.  No radiographic interpretation.     Alma Friendly MD [facility]    04/29/2019, 2:01 PM  LOS: 4 days

## 2019-04-30 ENCOUNTER — Telehealth: Payer: Self-pay | Admitting: Internal Medicine

## 2019-04-30 ENCOUNTER — Other Ambulatory Visit: Payer: Self-pay | Admitting: Oncology

## 2019-04-30 ENCOUNTER — Telehealth: Payer: Self-pay | Admitting: Pulmonary Disease

## 2019-04-30 DIAGNOSIS — I2699 Other pulmonary embolism without acute cor pulmonale: Secondary | ICD-10-CM

## 2019-04-30 DIAGNOSIS — R918 Other nonspecific abnormal finding of lung field: Secondary | ICD-10-CM

## 2019-04-30 LAB — CBC WITH DIFFERENTIAL/PLATELET
Abs Immature Granulocytes: 0.54 10*3/uL — ABNORMAL HIGH (ref 0.00–0.07)
Basophils Absolute: 0.1 10*3/uL (ref 0.0–0.1)
Basophils Relative: 1 %
Eosinophils Absolute: 0.2 10*3/uL (ref 0.0–0.5)
Eosinophils Relative: 1 %
HCT: 34.9 % — ABNORMAL LOW (ref 39.0–52.0)
Hemoglobin: 11.5 g/dL — ABNORMAL LOW (ref 13.0–17.0)
Immature Granulocytes: 5 %
Lymphocytes Relative: 16 %
Lymphs Abs: 1.7 10*3/uL (ref 0.7–4.0)
MCH: 30.6 pg (ref 26.0–34.0)
MCHC: 33 g/dL (ref 30.0–36.0)
MCV: 92.8 fL (ref 80.0–100.0)
Monocytes Absolute: 1.2 10*3/uL — ABNORMAL HIGH (ref 0.1–1.0)
Monocytes Relative: 11 %
Neutro Abs: 6.9 10*3/uL (ref 1.7–7.7)
Neutrophils Relative %: 66 %
Platelets: 331 10*3/uL (ref 150–400)
RBC: 3.76 MIL/uL — ABNORMAL LOW (ref 4.22–5.81)
RDW: 15.2 % (ref 11.5–15.5)
WBC: 10.5 10*3/uL (ref 4.0–10.5)
nRBC: 0 % (ref 0.0–0.2)

## 2019-04-30 LAB — COMPREHENSIVE METABOLIC PANEL
ALT: 131 U/L — ABNORMAL HIGH (ref 0–44)
AST: 95 U/L — ABNORMAL HIGH (ref 15–41)
Albumin: 2.1 g/dL — ABNORMAL LOW (ref 3.5–5.0)
Alkaline Phosphatase: 436 U/L — ABNORMAL HIGH (ref 38–126)
Anion gap: 8 (ref 5–15)
BUN: 18 mg/dL (ref 8–23)
CO2: 21 mmol/L — ABNORMAL LOW (ref 22–32)
Calcium: 8.4 mg/dL — ABNORMAL LOW (ref 8.9–10.3)
Chloride: 104 mmol/L (ref 98–111)
Creatinine, Ser: 1.01 mg/dL (ref 0.61–1.24)
GFR calc Af Amer: >60 mL/min (ref >60)
GFR calc non Af Amer: >60 mL/min (ref >60)
Glucose, Bld: 119 mg/dL — ABNORMAL HIGH (ref 70–99)
Potassium: 4.2 mmol/L (ref 3.5–5.1)
Sodium: 133 mmol/L — ABNORMAL LOW (ref 135–145)
Total Bilirubin: 1.9 mg/dL — ABNORMAL HIGH (ref 0.3–1.2)
Total Protein: 5.7 g/dL — ABNORMAL LOW (ref 6.5–8.1)

## 2019-04-30 LAB — GLUCOSE, CAPILLARY
Glucose-Capillary: 115 mg/dL — ABNORMAL HIGH (ref 70–99)
Glucose-Capillary: 128 mg/dL — ABNORMAL HIGH (ref 70–99)
Glucose-Capillary: 148 mg/dL — ABNORMAL HIGH (ref 70–99)
Glucose-Capillary: 152 mg/dL — ABNORMAL HIGH (ref 70–99)
Glucose-Capillary: 155 mg/dL — ABNORMAL HIGH (ref 70–99)
Glucose-Capillary: 156 mg/dL — ABNORMAL HIGH (ref 70–99)

## 2019-04-30 LAB — HEPARIN LEVEL (UNFRACTIONATED)
Heparin Unfractionated: 0.63 IU/mL (ref 0.30–0.70)
Heparin Unfractionated: 0.71 IU/mL — ABNORMAL HIGH (ref 0.30–0.70)
Heparin Unfractionated: 0.48 IU/mL (ref 0.30–0.70)

## 2019-04-30 MED ORDER — BUDESONIDE 0.25 MG/2ML IN SUSP
0.2500 mg | Freq: Two times a day (BID) | RESPIRATORY_TRACT | Status: DC
  Administered 2019-04-30 – 2019-05-05 (×8): 0.25 mg via RESPIRATORY_TRACT
  Filled 2019-04-30 (×10): qty 2

## 2019-04-30 MED ORDER — HEPARIN (PORCINE) 25000 UT/250ML-% IV SOLN
1300.0000 [IU]/h | INTRAVENOUS | Status: DC
  Administered 2019-05-01 – 2019-05-03 (×3): 1300 [IU]/h via INTRAVENOUS
  Filled 2019-04-30 (×5): qty 250

## 2019-04-30 MED ORDER — DM-GUAIFENESIN ER 30-600 MG PO TB12
1.0000 | ORAL_TABLET | Freq: Two times a day (BID) | ORAL | Status: DC
  Administered 2019-04-30 – 2019-05-06 (×13): 1 via ORAL
  Filled 2019-04-30 (×13): qty 1

## 2019-04-30 MED ORDER — EPINEPHRINE PF 1 MG/ML IJ SOLN
INTRAMUSCULAR | Status: DC | PRN
  Administered 2019-04-28: 2 mL

## 2019-04-30 NOTE — Progress Notes (Signed)
PROGRESS NOTE  Dennis Stephens HKN:[id number] DOB: 09-26-1933 DOA: 04/25/2019 PCP: [facility]  Brief summary: Patient is a 86 year old male with history of hypertension, A. fib, recent diagnosis of PE, likely metastatic CA with spiculated pulmonary nodules presented with abdominal pain. Patient reported pain 10/10 in upper quadrants, nausea and vomiting.  Patient also reported abdominal swelling, yellowing of his eyes and under the tongue.  Patient was recently admitted (04/20/2019-04/22/2019) for chest pain and was found to have PE for which he was started on Xarelto.  Patient was also found to have hilar and mediastinal lymphadenopathy with possible liver mets, spiculated pulmonary nodules during previous admission, he was scheduled for EBUS and bronchoscopy, declined further work-up inpatient and was discharged at his request.  He is requesting evaluation for hospice and palliative care. In ED, patient was found to have elevated lipase, 2207, elevated AST ALT alk phos 569 with leukocytosis. Abdominal ultrasound showed normal gallbladder, biliary duct dilatation with choledocholithiasis, recommended ERCP. Patient admitted for further management.    Today, patient still noted to be coughing, reports feeling congested, chest tightness.  Denies any chest pain, reports abdominal pain only when he coughs, denies nausea/vomiting, fever/chills.    Assessment/Plan: Principal Problem:   Acute pancreatitis Active Problems:   Pulmonary embolism (HCC)   Lung mass   Essential hypertension   Elevated LFTs   Bile duct stone   Acute pancreatitis likely 2/2 choledocholithiasis, acute transaminitis Currently afebrile, with leukocytosis Lipase trending down, 1879-->206 LFTs slowly trending down BC x2 NGTD GI on board, s/p ERCP with sphincterotomy, with CBD stone extraction on 3/16 by GI Dr. 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  Further immunohistochemical stains currently pending Oncology consulted, plan for outpatient follow-up appointment  Hyponatremia Likely due to poor p.o. intake, possible SIADH in the setting of cancer D/C IVF Daily CMP  AKI on CKD stage II Improved Creatinine peaked at 1.55, baseline around 1.1 Hold losartan, continue IV hydration Daily CMP  Essential hypertension BP stable Continue Coreg, restart norvasc, will increase to 5 mg Hold losartan  Diabetes mellitus type 2, NIDDM Hold Amaryl, continue sliding scale insulin  Tremor Continue primidone, hold if any worsening effects  Obesity Lifestyle modification advised  [facility] as an outpt as per oncology     Body mass index is 30.27 kg/m.    Code Status: DNR DVT Prophylaxis: home meds Xarelto held , on heparin drip   Family Communication: Discussed extensively with patient, spouse and sister at bedside  Disposition Plan:    Patient came from:         home  Anticipated d/c place:  Likely home, awaiting PT/OT eval  Barriers to d/c OR conditions which need to be met to effect a safe d/c: Awaiting PT/OT eval to enable safe d/c   Consultants:  Pulmonary critical care  GI  Palliative care  Procedures:  ERCP on March 16  EBUS on 3/17  Antibiotics:  Zosyn   Objective: BP (!) 143/62 (BP Location: Left Arm)   Pulse 66   Temp 97.9 F (36.6 C)   Resp 16   Ht '5\' 9"'$  (1.753 m)    Wt 93 kg   SpO2 98%   BMI 30.27 kg/m   Intake/Output Summary (Last 24 hours) at 04/30/2019 1508 Last data filed at 04/30/2019 1243 Gross per 24 hour  Intake 240 ml  Output 1850 ml  Net -1610 ml   Filed Weights   04/25/19 1718 04/27/19 0842 04/28/19 1331  Weight: 96 kg 96 kg 93 kg    Exam:  General: NAD   Cardiovascular: S1, S2 present  Respiratory:  Diminished breath sounds bilaterally  Abdomen: Soft, nontender, nondistended, bowel sounds present  Musculoskeletal: No bilateral pedal edema noted  Skin: Normal  Psychiatry: Normal mood    Data Reviewed: Basic Metabolic Panel: Recent Labs  Lab 04/26/19 0532 04/27/19 0201 04/27/19 1824 04/28/19 0109 04/30/19 0529  NA 128* 129* 126* 128* 133*  K 4.9 4.4 4.9 4.8 4.2  CL 98 100 99 102 104  CO2 20* 20* 20* 19* 21*  GLUCOSE 232* 124* 301* 282* 119*  BUN 23 27* 25* 27* 18  CREATININE 1.55* 1.22 1.11 1.05 1.01  CALCIUM 8.0* 8.0* 8.2* 8.1* 8.4*   Liver Function Tests: Recent Labs  Lab 04/26/19 0532 04/27/19 0201 04/27/19 1824 04/28/19 0109 04/30/19 0529  AST 506* 372* 261* 176* 95*  ALT 298* 263* 236* 196* 131*  ALKPHOS 551* 522* 625* 550* 436*  BILITOT 6.4* 5.9* 4.1* 2.9* 1.9*  PROT 6.0* 5.9* 6.1* 5.5* 5.7*  ALBUMIN 2.3* 2.3* 2.2* 2.0* 2.1*   Recent Labs  Lab 04/25/19 1721 04/26/19 0532 04/27/19 0201 04/28/19 0109  LIPASE 2,207* 1,879* 1,537* 206*   No results for input(s): AMMONIA in the last 168 hours. CBC: Recent Labs  Lab 04/27/19 0201 04/27/19 1824 04/28/19 0109 04/29/19 0758 04/30/19 0529  WBC 15.3* 10.6* 12.2* 12.1* 10.5  NEUTROABS  --   --   --   --  6.9  HGB 11.7* 12.1* 11.3* 12.2* 11.5*  HCT 36.0* 37.5* 34.1* 36.2* 34.9*  MCV 94.2 94.0 93.7 93.3 92.8  PLT 262 295 304 358 331   Cardiac Enzymes:   No results for input(s): CKTOTAL, CKMB, CKMBINDEX, TROPONINI in the last 168 hours. BNP (last 3 results) Recent Labs    04/25/19 1730  BNP 128.3*    ProBNP (last 3 results) No  results for input(s): PROBNP in the last 8760 hours.  CBG: Recent Labs  Lab 04/29/19 2025 04/30/19 0013 04/30/19 0220 04/30/19 0746 04/30/19 1218  GLUCAP 116* 152* 115* 128* 156*    Recent Results (from the past 240 hour(s))  SARS CORONAVIRUS 2 (TAT 6-24 HRS) Nasopharyngeal Nasopharyngeal Swab     Status: None   Collection Time: 04/21/19  7:08 AM   Specimen: Nasopharyngeal Swab  Result Value Ref Range Status   SARS Coronavirus 2 NEGATIVE NEGATIVE Final    Comment: (NOTE) SARS-CoV-2 target nucleic acids are NOT DETECTED. The SARS-CoV-2 RNA is generally detectable in upper and lower respiratory specimens during the acute phase of infection. Negative results do not preclude SARS-CoV-2 infection,  do not rule out co-infections with other pathogens, and should not be used as the sole basis for treatment or other patient management decisions. Negative results must be combined with clinical observations, patient history, and epidemiological information. The expected result is Negative. Fact Sheet for Patients: SugarRoll.be Fact Sheet for Healthcare Providers: https://www.woods-mathews.com/ This test is not yet approved or cleared by the [place] FDA and  has been authorized for detection and/or diagnosis of SARS-CoV-2 by FDA under an Emergency Use Authorization (EUA). This EUA will remain  in effect (meaning this test can be used) for the duration of the COVID-19 declaration under Section 56 4(b)(1) of the Act, 21 U.S.C. section 360bbb-3(b)(1), unless the authorization is terminated or revoked sooner. Performed at [facility], [place] [address] [place], [place] [address]   SARS CORONAVIRUS 2 (TAT 6-24 HRS) Nasopharyngeal Nasopharyngeal Swab     Status: None   Collection Time: 04/25/19  7:59 PM   Specimen: Nasopharyngeal Swab  Result Value Ref Range Status   SARS Coronavirus 2 NEGATIVE NEGATIVE Final    Comment:  (NOTE) SARS-CoV-2 target nucleic acids are NOT DETECTED. The SARS-CoV-2 RNA is generally detectable in upper and lower respiratory specimens during the acute phase of infection. Negative results do not preclude SARS-CoV-2 infection, do not rule out co-infections with other pathogens, and should not be used as the sole basis for treatment or other patient management decisions. Negative results must be combined with clinical observations, patient history, and epidemiological information. The expected result is Negative. Fact Sheet for Patients: SugarRoll.be Fact Sheet for Healthcare Providers: https://www.woods-mathews.com/ This test is not yet approved or cleared by the [place] FDA and  has been authorized for detection and/or diagnosis of SARS-CoV-2 by FDA under an Emergency Use Authorization (EUA). This EUA will remain  in effect (meaning this test can be used) for the duration of the COVID-19 declaration under Section 56 4(b)(1) of the Act, 21 U.S.C. section 360bbb-3(b)(1), unless the authorization is terminated or revoked sooner. Performed at [facility], [place] [address] [place], [place] [address]   Culture, blood (Routine X 2) w Reflex to ID Panel     Status: None (Preliminary result)   Collection Time: 04/26/19  1:02 PM   Specimen: BLOOD  Result Value Ref Range Status   Specimen Description   Final    BLOOD RIGHT ANTECUBITAL Performed at [facility] [address] [place], [place] [address]    Special Requests   Final    BOTTLES DRAWN AEROBIC AND ANAEROBIC Blood Culture adequate volume Performed at [facility] [address] [place], [place] [address]    Culture   Final    NO GROWTH 4 DAYS Performed at [facility], [place] [address] [place], [place] [address]    Report Status PENDING  Incomplete  Culture, blood (Routine X 2) w Reflex to ID Panel     Status: Abnormal    Collection Time: 04/26/19  1:02 PM   Specimen: BLOOD RIGHT HAND  Result Value Ref Range Status   Specimen Description   Final    BLOOD RIGHT HAND Performed at [facility] [address] [place], [place] [address]    Special Requests   Final    AEROBIC BOTTLE ONLY Blood Culture results may not be optimal due to an inadequate volume of blood received in culture bottles   Culture  Setup Time   Final    AEROBIC BOTTLE ONLY GRAM  POSITIVE COCCI IN CLUSTERS CRITICAL RESULT CALLED TO, READ BACK BY AND VERIFIED WITH: L POINDEXTER PHARMD 04/27/19 1810 JDW    Culture (A)  Final    STAPHYLOCOCCUS SPECIES (COAGULASE NEGATIVE) THE SIGNIFICANCE OF ISOLATING THIS ORGANISM FROM A SINGLE SET OF BLOOD CULTURES WHEN MULTIPLE SETS ARE DRAWN IS UNCERTAIN. PLEASE NOTIFY THE MICROBIOLOGY DEPARTMENT WITHIN ONE WEEK IF SPECIATION AND SENSITIVITIES ARE REQUIRED. Performed at [facility], [place] [address], [place], [place] [address]    Report Status 04/29/2019 FINAL  Final     Studies: No results found.   Alma Friendly MD [facility]    04/30/2019, 3:08 PM  LOS: 5 days

## 2019-04-30 NOTE — Care Management Important Message (Signed)
Important Message  Patient Details IM Letter given to [place] Case Manager to present to the Patient Name: Dennis Stephens MRN: [id number] Date of Birth: 1933/09/13   Medicare Important Message Given:  Yes     Kerin Salen 04/30/2019, 10:16 AM

## 2019-04-30 NOTE — Telephone Encounter (Signed)
Pt has been scheduled HFU with Dr. Valeta Harms 4/21 at 11am. appt will show up on pt's AVS once discharged from hospital. If appt needs to be changed, we can do this. Nothing further needed at this time.

## 2019-04-30 NOTE — Progress Notes (Signed)
NAME:  Dennis Stephens, MRN:  [id number], DOB:  1933-11-29, LOS: 5 ADMISSION DATE:  04/25/2019, CONSULTATION DATE: 04/26/2019 REFERRING MD: Dr. Tana Coast, CHIEF COMPLAINT: Lung mass  Brief History   86 year old gentleman, newly found lung mass on last hospital admission.  Patient was discharged with follow-up with the [facility] however came back with gallstone pancreatitis.  Currently here with plans for ERCP asked to see patient again to discuss potential biopsy.  History of present illness   86 year old gentleman past medical history of tobacco abuse, newly diagnosed bilateral PE, currently on Xarelto.  Recently admitted last week with plans for navigational bronchoscopy and EBUS bronchoscopy with TB NA of the mediastinum for last Friday.  However patient made decision for requesting discharge home and follow-up at the [facility].  Patient return to the hospital yesterday with abdominal pain found to have gallstone pancreatitis.  GI has seen the patient with recommendations for ERCP.  Unfortunately patient is currently taking Xarelto.  He did receive Xarelto dosing this morning.  Patient is a former smoker.  Quit 35 years ago.  First wife passed from lung cancer.  She was also a smoker.  Imaging of the abdomen revealed a 3.5 cm CBD with stone.  Past Medical History   Past Medical History:  Diagnosis Date  . A-fib ([place])   . DM2 (diabetes mellitus, type 2) ([place])   . Hypertension     Significant Hospital Events   04/27/2019: ERCP  Consults:  Gastroenterology  Procedures:  04/27/2019: ERCP   04/28/2019: [facility]oscopy at [facility], pending  Significant Diagnostic Tests:  CT scan of the chest with left upper lobe lung mass and associated mediastinal adenopathy.  MRCP 3/15 > 1. Exam is positive for choledocholithiasis. At least 4 stones are noted within the distal measuring up to 7 mm. There is increase caliber of the CBD which measures 1.1 cm in maximum dimension. 2. Gallbladder sludge and mild diffuse  gallbladder wall thickening. Cannot rule out cholecystitis 3. Two areas of abnormal increased T2 signal are identified within segment 8/4 and segment 7. The enhancement characteristics of these lesions are difficult to assess due to abnormal motion artifact on the postcontrast images. As mentioned on exam from 04/20/2019 these are new when compared with 01/20/2019. Metastatic disease cannot be excluded. 4. Mild diffuse pancreatic edema compatible with uncomplicated acute pancreatitis. No pancreatic necrosis, main duct dilatation or pseudocyst identified at this time. 5. No MRI correlate to the suspicious right kidney lesion identified on recent CT. This remains indeterminate and may not be seen due to motion artifact on today's study. Recommend follow-up imaging after resolution of this acute episode. Ideally this should be performed as an outpatient when the patient is able to lay motionless and breath hold.   Micro Data:  COVID 3/14 > Negative  Blood cultures 3/15 > 1x positive coag neg staph   Antimicrobials:  Piperacillin tazobactam  Interim history/subjective:   Comfortable in bed, no hemoptysis.   Objective   Blood pressure (!) 143/62, pulse 66, temperature 97.9 F (36.6 C), resp. rate 16, height 5\' 9"  (1.753 m), weight 93 kg, SpO2 98 %.        Intake/Output Summary (Last 24 hours) at 04/30/2019 1207 Last data filed at 04/30/2019 1000 Gross per 24 hour  Intake 360 ml  Output 1850 ml  Net -1490 ml   Filed Weights   04/25/19 1718 04/27/19 0842 04/28/19 1331  Weight: 96 kg 96 kg 93 kg    Examination: General appearance: 86 y.o., male,  NAD, conversant, good insight  Eyes: tracking appropriately  HENT: NCAT; oropharynx, MMM Neck: Trachea midline Lungs: CTAB, no crackles, no wheeze CV: RRR, S1, S2, no MRGs  Abdomen: Soft, non-tender; non-distended, BS present  Extremities: No peripheral edema Skin: Normal temperature, turgor and texture; no rash Psych:  Appropriate affect Neuro: Alert and oriented to person and place, no focal deficit     Resolved Hospital Problem list     Assessment & Plan:   Bilateral upper lobe solitary pulmonary nodules, enlarged 4R lymphnode - imaging concerning for bronchogenic carcinoma - underwent bronch 3/17 P: Path pending, looked like NSCLC on prelim  Continue heparin  D/c on oral AC  Continue BD Dc on triple therapy inhaler regimen   I will set up follow up in clinic  I spoke with oncology, plans for op follow up as well   Obstructed common bile duct, gallstone pancreatitis Per gi and primary   Best practice:  Diet: Clear liquid  DVT prophylaxis: Heparin drip Code Status: DNR  Labs   CBC: Recent Labs  Lab 04/27/19 0201 04/27/19 1824 04/28/19 0109 04/29/19 0758 04/30/19 0529  WBC 15.3* 10.6* 12.2* 12.1* 10.5  NEUTROABS  --   --   --   --  6.9  HGB 11.7* 12.1* 11.3* 12.2* 11.5*  HCT 36.0* 37.5* 34.1* 36.2* 34.9*  MCV 94.2 94.0 93.7 93.3 92.8  PLT 262 295 304 358 387    Basic Metabolic Panel: Recent Labs  Lab 04/26/19 0532 04/27/19 0201 04/27/19 1824 04/28/19 0109 04/30/19 0529  NA 128* 129* 126* 128* 133*  K 4.9 4.4 4.9 4.8 4.2  CL 98 100 99 102 104  CO2 20* 20* 20* 19* 21*  GLUCOSE 232* 124* 301* 282* 119*  BUN 23 27* 25* 27* 18  CREATININE 1.55* 1.22 1.11 1.05 1.01  CALCIUM 8.0* 8.0* 8.2* 8.1* 8.4*   GFR: Estimated Creatinine Clearance: 62.4 mL/min (by C-G formula based on SCr of 1.01 mg/dL). Recent Labs  Lab 04/27/19 1824 04/28/19 0109 04/29/19 0758 04/30/19 0529  WBC 10.6* 12.2* 12.1* 10.5    Liver Function Tests: Recent Labs  Lab 04/26/19 0532 04/27/19 0201 04/27/19 1824 04/28/19 0109 04/30/19 0529  AST 506* 372* 261* 176* 95*  ALT 298* 263* 236* 196* 131*  ALKPHOS 551* 522* 625* 550* 436*  BILITOT 6.4* 5.9* 4.1* 2.9* 1.9*  PROT 6.0* 5.9* 6.1* 5.5* 5.7*  ALBUMIN 2.3* 2.3* 2.2* 2.0* 2.1*   Recent Labs  Lab 04/25/19 1721 04/26/19 0532  04/27/19 0201 04/28/19 0109  LIPASE 2,207* 1,879* 1,537* 206*   No results for input(s): AMMONIA in the last 168 hours.  ABG    Component Value Date/Time   TCO2 25 01/20/2019 1823     Coagulation Profile: Recent Labs  Lab 04/26/19 0532 04/27/19 1824  INR 1.2 1.2    Cardiac Enzymes: No results for input(s): CKTOTAL, CKMB, CKMBINDEX, TROPONINI in the last 168 hours.  HbA1C: Hgb A1c MFr Bld  Date/Time Value Ref Range Status  04/21/2019 05:34 AM 8.7 (H) 4.8 - 5.6 % Final    Comment:    (NOTE) Pre diabetes:          5.7%-6.4% Diabetes:              >6.4% Glycemic control for   <7.0% adults with diabetes     CBG: Recent Labs  Lab 04/29/19 1701 04/29/19 2025 04/30/19 0013 04/30/19 0220 04/30/19 0746  GLUCAP 164* 116* 152* 115* 128*    Dennis Valeri L Dominica Kent, DO [place]  Pulmonary Critical Care 04/30/2019 12:07 PM

## 2019-04-30 NOTE — Progress Notes (Signed)
ANTICOAGULATION CONSULT NOTE - Consult  Pharmacy Consult for heparin Indication: Recent PE, Xarelto on hold  No Known Allergies  Patient Measurements: Height: 5\' 9"  (175.3 cm) Weight: 205 lb (93 kg) IBW/kg (Calculated) : 70.7 Heparin Dosing Weight: 90 kg  Vital Signs: Temp: 98.2 F (36.8 C) (03/19 2036) Temp Source: Oral (03/19 2036) BP: 168/68 (03/19 2036) Pulse Rate: 71 (03/19 2036)  Labs: Recent Labs    04/28/19 0109 04/28/19 0109 04/29/19 0758 04/29/19 1838 04/30/19 0529 04/30/19 1210 04/30/19 2247  HGB 11.3*   < > 12.2*  --  11.5*  --   --   HCT 34.1*  --  36.2*  --  34.9*  --   --   PLT 304  --  358  --  331  --   --   APTT 56*  --   --   --   --   --   --   HEPARINUNFRC 0.28*  --   --    < > 0.63 0.71* 0.48  CREATININE 1.05  --   --   --  1.01  --   --    < > = values in this interval not displayed.    Estimated Creatinine Clearance: 62.4 mL/min (by C-G formula based on SCr of 1.01 mg/dL).   Medical History: Past Medical History:  Diagnosis Date  . A-fib ([place])   . DM2 (diabetes mellitus, type 2) ([place])   . Hypertension    Assessment: Pt is a 30 yoM with PMH significant for afib, recently diagnosed PE, recently diagnosed likely metastatic cancer with pulmonary nodules.   Pt was recently admitted from 04/20/19 - 04/22/19 at which time he was diagnosed with bilateral PE and prescribed rivaroxaban. Rivaroxaban 15 mg PO BID initiated on 3/11, last dose was given at 0815 on 04/26/19. Anticoagulation was converted to heparin drip on 3/15 for planned procedures.   Significant Events: -3/16: ERCP -3/17: Bronch  Today, 04/30/19  HL = 0.63 this morning was therapeutic but trending up. Checked repeat 6 hour level this afternoon: HL = 0.71 now slightly supratherapeutic on heparin infusion of 1400 units/hr  Confirmed with RN that heparin infusing at correct rate. No signs of bleeding.  CBC: Hgb 11.5 remains low but stable; Plt WNL & stable  SCr 1.01, CrCl 62  mL/min. Improved  APTT and HL correlated on 3/17, can monitor using HL only now.  2247 HL = 0.48 at goal, no infusion or bleeding issues reported by RN.   Goal of Therapy:  Heparin level 0.3-0.7 units/ml Monitor platelets by anticoagulation protocol: Yes   Plan:   Continue heparin drip at 1300 units/hr  CBC, HL daily while on heparin  Monitor for signs and symptoms of bleeding  Follow along for transition back to rivaroxaban  Dorrene German, PharmD 04/30/19 11:51 PM

## 2019-04-30 NOTE — Telephone Encounter (Signed)
PCCM:  Please setup appt with Leory Plowman L Xavior Niazi, DO in 3-4 weeks post-discharge for hospital follow up and bronch follow up.   [facility] 04/30/2019 12:13 PM

## 2019-04-30 NOTE — Progress Notes (Signed)
ANTICOAGULATION CONSULT NOTE - Initial Consult  Pharmacy Consult for heparin Indication: Recent PE, Xarelto on hold  No Known Allergies  Patient Measurements: Height: 5\' 9"  (175.3 cm) Weight: 205 lb (93 kg) IBW/kg (Calculated) : 70.7 Heparin Dosing Weight: 90 kg  Vital Signs: Temp: 97.9 F (36.6 C) (03/19 0219) BP: 143/62 (03/19 0219) Pulse Rate: 66 (03/19 0219)  Labs: Recent Labs    04/27/19 1824 04/27/19 1824 04/28/19 0109 04/28/19 0109 04/29/19 0758 04/29/19 1838 04/30/19 0529 04/30/19 1210  HGB 12.1*   < > 11.3*   < > 12.2*  --  11.5*  --   HCT 37.5*   < > 34.1*  --  36.2*  --  34.9*  --   PLT 295   < > 304  --  358  --  331  --   APTT 46*  --  56*  --   --   --   --   --   LABPROT 15.2  --   --   --   --   --   --   --   INR 1.2  --   --   --   --   --   --   --   HEPARINUNFRC  --   --  0.28*   < >  --  0.43 0.63 0.71*  CREATININE 1.11  --  1.05  --   --   --  1.01  --    < > = values in this interval not displayed.    Estimated Creatinine Clearance: 62.4 mL/min (by C-G formula based on SCr of 1.01 mg/dL).   Medical History: Past Medical History:  Diagnosis Date  . A-fib ([place])   . DM2 (diabetes mellitus, type 2) ([place])   . Hypertension    Assessment: Pt is a 84 yoM with PMH significant for afib, recently diagnosed PE, recently diagnosed likely metastatic cancer with pulmonary nodules.   Pt was recently admitted from 04/20/19 - 04/22/19 at which time he was diagnosed with bilateral PE and prescribed rivaroxaban. Rivaroxaban 15 mg PO BID initiated on 3/11, last dose was given at 0815 on 04/26/19. Anticoagulation was converted to heparin drip on 3/15 for planned procedures.   Significant Events: -3/16: ERCP -3/17: Bronch  Today, 04/30/19  HL = 0.63 this morning was therapeutic but trending up. Checked repeat 6 hour level this afternoon: HL = 0.71 now slightly supratherapeutic on heparin infusion of 1400 units/hr  Confirmed with RN that heparin infusing at  correct rate. No signs of bleeding.  CBC: Hgb 11.5 remains low but stable; Plt WNL & stable  SCr 1.01, CrCl 62 mL/min. Improved  APTT and HL correlated on 3/17, can monitor using HL only now.  Goal of Therapy:  Heparin level 0.3-0.7 units/ml Monitor platelets by anticoagulation protocol: Yes   Plan:   Reduce heparin infusion to 1300 units/hr  Check 8 hour HL  CBC, HL daily while on heparin  Monitor for signs and symptoms of bleeding  Follow along for transition back to rivaroxaban  Lenis Noon, PharmD 04/30/19 1:51 PM

## 2019-04-30 NOTE — Telephone Encounter (Signed)
Scheduled appt per 3/19 sch message - pt wife aware of appt

## 2019-05-01 ENCOUNTER — Inpatient Hospital Stay (HOSPITAL_COMMUNITY): Payer: No Typology Code available for payment source

## 2019-05-01 LAB — CBC WITH DIFFERENTIAL/PLATELET
Abs Immature Granulocytes: 0.5 10*3/uL — ABNORMAL HIGH (ref 0.00–0.07)
Basophils Absolute: 0.1 10*3/uL (ref 0.0–0.1)
Basophils Relative: 1 %
Eosinophils Absolute: 0.2 10*3/uL (ref 0.0–0.5)
Eosinophils Relative: 2 %
HCT: 34 % — ABNORMAL LOW (ref 39.0–52.0)
Hemoglobin: 11.4 g/dL — ABNORMAL LOW (ref 13.0–17.0)
Immature Granulocytes: 5 %
Lymphocytes Relative: 13 %
Lymphs Abs: 1.4 10*3/uL (ref 0.7–4.0)
MCH: 30.9 pg (ref 26.0–34.0)
MCHC: 33.5 g/dL (ref 30.0–36.0)
MCV: 92.1 fL (ref 80.0–100.0)
Monocytes Absolute: 0.9 10*3/uL (ref 0.1–1.0)
Monocytes Relative: 8 %
Neutro Abs: 7.6 10*3/uL (ref 1.7–7.7)
Neutrophils Relative %: 71 %
Platelets: 332 10*3/uL (ref 150–400)
RBC: 3.69 MIL/uL — ABNORMAL LOW (ref 4.22–5.81)
RDW: 14.7 % (ref 11.5–15.5)
WBC: 10.6 10*3/uL — ABNORMAL HIGH (ref 4.0–10.5)
nRBC: 0 % (ref 0.0–0.2)

## 2019-05-01 LAB — COMPREHENSIVE METABOLIC PANEL
ALT: 118 U/L — ABNORMAL HIGH (ref 0–44)
AST: 79 U/L — ABNORMAL HIGH (ref 15–41)
Albumin: 2.3 g/dL — ABNORMAL LOW (ref 3.5–5.0)
Alkaline Phosphatase: 404 U/L — ABNORMAL HIGH (ref 38–126)
Anion gap: 9 (ref 5–15)
BUN: 21 mg/dL (ref 8–23)
CO2: 22 mmol/L (ref 22–32)
Calcium: 8.6 mg/dL — ABNORMAL LOW (ref 8.9–10.3)
Chloride: 101 mmol/L (ref 98–111)
Creatinine, Ser: 1.06 mg/dL (ref 0.61–1.24)
GFR calc Af Amer: >60 mL/min (ref >60)
GFR calc non Af Amer: >60 mL/min (ref >60)
Glucose, Bld: 186 mg/dL — ABNORMAL HIGH (ref 70–99)
Potassium: 4 mmol/L (ref 3.5–5.1)
Sodium: 132 mmol/L — ABNORMAL LOW (ref 135–145)
Total Bilirubin: 1.7 mg/dL — ABNORMAL HIGH (ref 0.3–1.2)
Total Protein: 6.1 g/dL — ABNORMAL LOW (ref 6.5–8.1)

## 2019-05-01 LAB — GLUCOSE, CAPILLARY
Glucose-Capillary: 155 mg/dL — ABNORMAL HIGH (ref 70–99)
Glucose-Capillary: 155 mg/dL — ABNORMAL HIGH (ref 70–99)
Glucose-Capillary: 156 mg/dL — ABNORMAL HIGH (ref 70–99)
Glucose-Capillary: 174 mg/dL — ABNORMAL HIGH (ref 70–99)
Glucose-Capillary: 189 mg/dL — ABNORMAL HIGH (ref 70–99)

## 2019-05-01 LAB — CULTURE, BLOOD (ROUTINE X 2)
Culture: NO GROWTH
Special Requests: ADEQUATE

## 2019-05-01 LAB — HEPARIN LEVEL (UNFRACTIONATED): Heparin Unfractionated: 0.5 IU/mL (ref 0.30–0.70)

## 2019-05-01 MED ORDER — ALUM HYDROXIDE-MAG CARBONATE 95-358 MG/15ML PO SUSP: 30.0000 mL | Freq: Three times a day (TID) | ORAL | Status: DC

## 2019-05-01 MED ORDER — BISACODYL 10 MG RE SUPP
10.0000 mg | Freq: Once | RECTAL | Status: DC
  Filled 2019-05-01: qty 1

## 2019-05-01 MED ORDER — POLYETHYLENE GLYCOL 3350 17 G PO PACK
17.0000 g | PACK | Freq: Every day | ORAL | Status: DC
  Filled 2019-05-01 (×3): qty 1

## 2019-05-01 MED ORDER — ALUM & MAG HYDROXIDE-SIMETH 200-200-20 MG/5ML PO SUSP
30.0000 mL | Freq: Three times a day (TID) | ORAL | Status: DC
  Administered 2019-05-02 – 2019-05-04 (×4): 30 mL via ORAL
  Filled 2019-05-01 (×9): qty 30

## 2019-05-01 MED ORDER — IPRATROPIUM-ALBUTEROL 0.5-2.5 (3) MG/3ML IN SOLN
3.0000 mL | Freq: Two times a day (BID) | RESPIRATORY_TRACT | Status: DC
  Administered 2019-05-02: 3 mL via RESPIRATORY_TRACT
  Filled 2019-05-01 (×2): qty 3

## 2019-05-01 MED ORDER — LOSARTAN POTASSIUM 50 MG PO TABS
100.0000 mg | ORAL_TABLET | Freq: Every day | ORAL | Status: DC
  Administered 2019-05-01 – 2019-05-02 (×2): 100 mg via ORAL
  Filled 2019-05-01 (×2): qty 2

## 2019-05-01 NOTE — Progress Notes (Signed)
Occupational Therapy Evaluation Patient Details Name: Dennis Stephens MRN: [id number] DOB: 1933-03-18 Today's Date: 05/01/2019    History of Present Illness Patient is a 86 year old male with history of hypertension, A. fib, recent diagnosis of PE, likely metastatic CA with spiculated pulmonary nodules presented with abdominal pain. Dx of acute pancreatitis   Clinical Impression   Pt reports living at home with spouse, who provides assist with ADL and IADL tasks at [facility]. Pt reports that he will be discharging home with hospice services due to recent cancer dx. Overall, pt requires setup to Moderate assist for self-care tasks and Minimal Assist for functional mobility with extra time. Pt has difficulty with LB dressing skills and would benefit from AE to decrease level of assist upon d/c. Pt will benefit from continued skilled OT services to decrease level of assist with self-care tasks and functional mobility.     Follow Up Recommendations  Home health OT;Supervision/Assistance - 24 hour    Equipment Recommendations  3 in 1 bedside commode    Recommendations for Other Services       Precautions / Restrictions Precautions Precautions: Fall Restrictions Weight Bearing Restrictions: No      Mobility Bed Mobility                  Transfers Overall transfer level: Needs assistance   Transfers: Sit to/from Stand Sit to Stand: Min assist              Balance Overall balance assessment: Needs assistance   Sitting balance-Leahy Scale: Fair       Standing balance-Leahy Scale: Fair                             ADL either performed or assessed with clinical judgement   ADL Overall ADL's : Needs assistance/impaired Eating/Feeding: Supervision/ safety   Grooming: Oral care;Wash/dry face;Wash/dry hands;Supervision/safety;Bed level   Upper Body Bathing: Set up   Lower Body Bathing: Moderate assistance   Upper Body Dressing : Set up   Lower Body  Dressing: Moderate assistance   Toilet Transfer: Minimal assistance;Grab bars   Toileting- Clothing Manipulation and Hygiene: Minimal assistance   Tub/ Shower Transfer: Moderate assistance;Shower seat   Functional mobility during ADLs: Minimal assistance       Vision Baseline Vision/History: Wears glasses Wears Glasses: Reading only       Perception     Praxis      Pertinent Vitals/Pain Pain Assessment: 0-10 Pain Score: 0-No pain     Hand Dominance Right   Extremity/Trunk Assessment Upper Extremity Assessment Upper Extremity Assessment: RUE deficits/detail;LUE deficits/detail RUE: (Grossly 3+/5 for B UE and WFL for AROM) LUE: (Grossly 3+/5 for B UE and WFL for AROM)   Lower Extremity Assessment Lower Extremity Assessment: Defer to PT evaluation       Communication Communication Communication: No difficulties   Cognition Arousal/Alertness: Awake/alert Behavior During Therapy: WFL for tasks assessed/performed                                       General Comments       Exercises     Shoulder Instructions      Home Living Family/patient expects to be discharged to:: Private residence Living Arrangements: Spouse/significant other Available Help at Discharge: Family Type of Home: House Home Access: Stairs to enter [employer] of Steps: 2 Entrance Stairs-Rails:  None Home Layout: Two level;Bed/bath upstairs;1/2 bath on main level Alternate Level Stairs-Number of Steps: 15 Alternate Level Stairs-Rails: Right Bathroom Shower/Tub: Occupational psychologist: Standard Bathroom Accessibility: Yes How Accessible: Accessible via walker Home Equipment: Hand held shower head;None          Prior Functioning/Environment Level of Independence: Needs assistance    ADL's / Homemaking Assistance Needed: spouse assist with LB dressing    Comments: drives        OT Problem List: Decreased strength;Decreased activity  tolerance;Impaired balance (sitting and/or standing);Decreased knowledge of use of DME or AE;Decreased safety awareness      OT Treatment/Interventions: Self-care/ADL training;Therapeutic exercise;Balance training;Patient/family education;Therapeutic activities;Energy conservation    OT Goals(Current goals can be found in the care plan section) Acute Rehab OT Goals Patient Stated Goal: to go home  OT Goal Formulation: With patient Time For Goal Achievement: 05/15/19 Potential to Achieve Goals: Good  OT Frequency: Min 2X/week   Barriers to D/C:            Co-evaluation              AM-PAC OT "6 Clicks" Daily Activity     Outcome Measure Help from another person eating meals?: A Little Help from another person taking care of personal grooming?: A Little Help from another person toileting, which includes using toliet, bedpan, or urinal?: A Little Help from another person bathing (including washing, rinsing, drying)?: A Lot Help from another person to put on and taking off regular upper body clothing?: A Little Help from another person to put on and taking off regular lower body clothing?: A Lot 6 Click Score: 16   End of Session Equipment Utilized During Treatment: Gait belt Nurse Communication: Mobility status  Activity Tolerance: Patient limited by fatigue Patient left: in bed;with call bell/phone within reach;with bed alarm set  OT Visit Diagnosis: Unsteadiness on feet (R26.81)                Time: 9373-4287 OT Time Calculation (min): 18 min Charges:  OT General Charges $OT Visit: 1 Visit OT Evaluation $OT Eval Moderate Complexity: 1 Mod  Valeria Boza OTR/L   Jelan Batterton 05/01/2019, 2:57 PM

## 2019-05-01 NOTE — Progress Notes (Signed)
05/01/2019 PT evaluation complete.  Full note to follow.  Pt was able to get up with min guard assist and get about the room on RA with VSS.  He is weak and will need to practice his full flight of stairs to get up to his bedroom to ensure he can safely prior to d/c.  He says he plans to go home with home hospice.    Verdene Lennert, PT, DPT  Acute Rehabilitation [phone] pager [phone] office

## 2019-05-01 NOTE — Evaluation (Signed)
Physical Therapy Evaluation Patient Details Name: Dennis Stephens MRN: [id number] DOB: [birth year] Today's Date: 05/01/2019   History of Present Illness  Patient is a 86 year old male with history of hypertension, A. fib, recent diagnosis of PE, likely metastatic CA with spiculated pulmonary nodules presented with abdominal pain. Dx of acute pancreatitis.  Pt s/p ECRP on 04/27/19.   Clinical Impression  Pt is weak and mildly unsteady on his feet, however, does well with light min assistance walking around the room and in the bathroom.  He has family support at home and reports he is going home with hospice.  If he can have home therapy and hospice, I would recommend it, but if not, hospice will take care of his equipment and mobility needs.   PT to follow acutely for deficits listed below.      Follow Up Recommendations Home health PT;Other (comment)(if he can with home hospice)    Equipment Recommendations  None recommended by PT    Recommendations for Other Services   NA    Precautions / Restrictions Precautions Precautions: Fall Precaution Comments: mildly unsteady on his feet Restrictions Weight Bearing Restrictions: No      Mobility  Bed Mobility Overal bed mobility: Modified Independent             General bed mobility comments: a bit of extra time needed to get EOB.   Transfers Overall transfer level: Needs assistance Equipment used: 1 person hand held assist Transfers: Sit to/from Stand Sit to Stand: Min assist         General transfer comment: min assist to steady himself once standing.   Ambulation/Gait Ambulation/Gait assistance: Min assist Gait Distance (Feet): 20 Feet Assistive device: 1 person hand held assist Gait Pattern/deviations: Step-through pattern;Staggering right;Staggering left     General Gait Details: mildly staggering gait pattern, min hand held assist for balance.          Balance Overall balance assessment: Needs  assistance Sitting-balance support: Feet supported;Bilateral upper extremity supported Sitting balance-Leahy Scale: Fair     Standing balance support: Bilateral upper extremity supported;Single extremity supported Standing balance-Leahy Scale: Fair Standing balance comment: close supervision                             Pertinent Vitals/Pain Pain Assessment: Faces Faces Pain Scale: Hurts even more Pain Location: abdominal bloating/gas pain Pain Descriptors / Indicators: Grimacing;Guarding Pain Intervention(s): Limited activity within patient's tolerance;Monitored during session;Repositioned    Home Living Family/patient expects to be discharged to:: Private residence Living Arrangements: Spouse/significant other Available Help at Discharge: Family Type of Home: House Home Access: Stairs to enter Entrance Stairs-Rails: None Technical brewer of Steps: 2 Home Layout: Two level;Bed/bath upstairs;1/2 bath on main level Home Equipment: Hand held shower head;None      Prior Function Level of Independence: Needs assistance   Gait / Transfers Assistance Needed: indepedent with gait, drives  ADL's / Homemaking Assistance Needed: spouse assist with LB dressing         Hand Dominance   Dominant Hand: Right    Extremity/Trunk Assessment   Upper Extremity Assessment Upper Extremity Assessment: Defer to OT evaluation    Lower Extremity Assessment Lower Extremity Assessment: Generalized weakness    Cervical / Trunk Assessment Cervical / Trunk Assessment: Normal  Communication   Communication: No difficulties  Cognition Arousal/Alertness: Awake/alert Behavior During Therapy: WFL for tasks assessed/performed Overall Cognitive Status: Within Functional Limits for tasks assessed  General Comments General comments (skin integrity, edema, etc.): O2 94% on RA during mobility.         Assessment/Plan     PT Assessment Patient needs continued PT services  PT Problem List Decreased strength;Decreased balance;Decreased activity tolerance;Decreased mobility;Decreased knowledge of use of DME;Obesity;Pain       PT Treatment Interventions DME instruction;Gait training;Stair training;Functional mobility training;Therapeutic activities;Therapeutic exercise;Balance training;Patient/family education    PT Goals (Current goals can be found in the Care Plan section)  Acute Rehab PT Goals Patient Stated Goal: to go home  PT Goal Formulation: With patient Time For Goal Achievement: 05/15/19 Potential to Achieve Goals: Good    Frequency Min 3X/week    AM-PAC PT "6 Clicks" Mobility  Outcome Measure Help needed turning from your back to your side while in a flat bed without using bedrails?: A Little Help needed moving from lying on your back to sitting on the side of a flat bed without using bedrails?: A Little Help needed moving to and from a bed to a chair (including a wheelchair)?: A Little Help needed standing up from a chair using your arms (e.g., wheelchair or bedside chair)?: A Little Help needed to walk in hospital room?: A Little Help needed climbing 3-5 steps with a railing? : A Little 6 Click Score: 18    End of Session Equipment Utilized During Treatment: Gait belt Activity Tolerance: Patient limited by fatigue Patient left: in bed;with call bell/phone within reach;with bed alarm set Nurse Communication: Mobility status PT Visit Diagnosis: Muscle weakness (generalized) (M62.81);Difficulty in walking, not elsewhere classified (R26.2)         PT Time Calculation  PT Start Time (ACUTE ONLY) 1140  PT Stop Time (ACUTE ONLY) 1215  PT Time Calculation (min) (ACUTE ONLY) 35 min  PT General Charges  $$ ACUTE PT VISIT 1 Visit  PT Evaluation  $PT Eval Moderate Complexity 1 Mod  PT Treatments  $Gait Training 8-22 mins     Verdene Lennert, PT, DPT  Acute Rehabilitation [phone]  pager [phone]) [phone] office

## 2019-05-01 NOTE — Progress Notes (Signed)
PRN BP MEDS Given.

## 2019-05-01 NOTE — Progress Notes (Signed)
ANTICOAGULATION CONSULT NOTE - Initial Consult  Pharmacy Consult for heparin Indication: Recent PE, Xarelto on hold  No Known Allergies  Patient Measurements: Height: 5\' 9"  (175.3 cm) Weight: 205 lb (93 kg) IBW/kg (Calculated) : 70.7 Heparin Dosing Weight: 90 kg  Vital Signs: Temp: 98 F (36.7 C) (03/20 0443) Temp Source: Oral (03/20 0443) BP: 178/83 (03/20 0654) Pulse Rate: 79 (03/20 0654)  Labs: Recent Labs    04/29/19 0758 04/29/19 1838 04/30/19 0529 04/30/19 0529 04/30/19 1210 04/30/19 2247 05/01/19 0553  HGB 12.2*  --  11.5*  --   --   --  11.4*  HCT 36.2*  --  34.9*  --   --   --  34.0*  PLT 358  --  331  --   --   --  332  HEPARINUNFRC  --    < > 0.63   < > 0.71* 0.48 0.50  CREATININE  --   --  1.01  --   --   --  1.06   < > = values in this interval not displayed.    Estimated Creatinine Clearance: 59.4 mL/min (by C-G formula based on SCr of 1.06 mg/dL).   Medical History: Past Medical History:  Diagnosis Date  . A-fib ([place])   . DM2 (diabetes mellitus, type 2) ([place])   . Hypertension    Assessment: Pt is a 18 yoM with PMH significant for afib, recently diagnosed PE, recently diagnosed likely metastatic cancer with pulmonary nodules.   Pt was recently admitted from 04/20/19 - 04/22/19 at which time he was diagnosed with bilateral PE and prescribed rivaroxaban. Rivaroxaban 15 mg PO BID initiated on 3/11, last dose was given at 0815 on 04/26/19. Anticoagulation was converted to heparin drip on 3/15 for planned procedures.   Significant Events: -3/16: ERCP -3/17: Bronch  Today, 05/01/2019: - heparin level remains therapeutic at 0.50 - cbc stable - no bleeding documented   Goal of Therapy:  Heparin level 0.3-0.7 units/ml Monitor platelets by anticoagulation protocol: Yes   Plan:  - continue he[srin drip at 1300 units/hr - daily heparin level - Monitor for signs and symptoms of bleeding - Follow along for transition back to rivaroxaban  Lynelle Doctor, PharmD 05/01/19 9:45 AM

## 2019-05-01 NOTE — Progress Notes (Signed)
PROGRESS NOTE  Dennis Stephens ZOX:[id number] DOB: 02/19/1933 DOA: 04/25/2019 PCP: [facility]  Brief summary: Patient is a 86 year old male with history of hypertension, A. fib, recent diagnosis of PE, likely metastatic CA with spiculated pulmonary nodules presented with abdominal pain. Patient reported pain 10/10 in upper quadrants, nausea and vomiting.  Patient also reported abdominal swelling, yellowing of his eyes and under the tongue.  Patient was recently admitted (04/20/2019-04/22/2019) for chest pain and was found to have PE for which he was started on Xarelto.  Patient was also found to have hilar and mediastinal lymphadenopathy with possible liver mets, spiculated pulmonary nodules during previous admission, he was scheduled for EBUS and bronchoscopy, declined further work-up inpatient and was discharged at his request.  He is requesting evaluation for hospice and palliative care. In ED, patient was found to have elevated lipase, 2207, elevated AST ALT alk phos 569 with leukocytosis. Abdominal ultrasound showed normal gallbladder, biliary duct dilatation with choledocholithiasis, recommended ERCP. Patient admitted for further management.    Today, patient reports having indigestion/reflux with some abdominal discomfort.  Denies any nausea/vomiting, chest pain, fever/chills.    Assessment/Plan: Principal Problem:   Acute pancreatitis Active Problems:   Pulmonary embolism (HCC)   Lung mass   Essential hypertension   Elevated LFTs   Bile duct stone   Acute pancreatitis likely 2/2 choledocholithiasis, acute transaminitis Currently afebrile, with leukocytosis Lipase trending down, 1879-->206 LFTs slowly trending down BC x2 NGTD GI on board, s/p ERCP with sphincterotomy, with CBD stone extraction on 3/16 by GI Dr. Henrene Pastor, GI signed off Continue IV Zosyn for a total of 5 days Daily CMP  Possible ileus Abdominal x-ray showed possible ileus, possible early  obstruction Reports abdominal discomfort, had a BM, denies any nausea/vomiting We will keep on a full liquid diet for now Reevaluate in the a.m. 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  Further immunohistochemical stains currently pending Oncology consulted, plan for outpatient follow-up appointment  Hyponatremia Improving Likely due to poor p.o. intake, possible SIADH in the setting of cancer D/C IVF Daily CMP  AKI on CKD stage II Improved Creatinine peaked at 1.55, baseline around 1.1 Hold losartan Daily CMP  Essential hypertension BP stable Continue Coreg, restart norvasc, will increase to 5 mg Hold losartan  Diabetes mellitus type 2, NIDDM Hold Amaryl, continue sliding scale insulin  Tremor Continue primidone, hold if any worsening effects  Obesity Lifestyle modification advised  [place] Palliative/hospice team consulted as patient stated he would not proceed with any treatment for his malignancy regardless of what the final biopsy issues.  Wants to talk to the hospice team     Body mass index is 30.27 kg/m.    Code Status: DNR DVT Prophylaxis: home meds Xarelto held , on heparin drip   Family Communication: Discussed extensively with patient, spouse and sister at bedside  Disposition Plan:    Patient came from:         home  Anticipated d/c place:  Likely home, awaiting PT/OT  eval  Barriers to d/c OR conditions which need to be met to effect a safe d/c: Awaiting PT/OT eval to enable safe d/c, as well as palliative consult   Consultants:  Pulmonary critical care  GI  Palliative care  Procedures:  ERCP on March 16  EBUS on 3/17  Antibiotics:  Zosyn   Objective: BP (!) 169/70 (BP Location: Left Arm)   Pulse 75   Temp 98.2 F (36.8 C) (Oral)   Resp 20   Ht '5\' 9"'$  (1.753 m)   Wt 93 kg   SpO2 95%   BMI 30.27 kg/m   Intake/Output Summary (Last 24 hours) at 05/01/2019 1651 Last data filed at 05/01/2019 1500 Gross per 24 hour  Intake 480 ml  Output 1100 ml  Net -620 ml   Filed Weights   04/25/19 1718 04/27/19 0842 04/28/19 1331  Weight: 96 kg 96 kg 93 kg    Exam:  General: NAD   Cardiovascular: S1, S2 present  Respiratory:  Diminished breath sounds bilaterally  Abdomen: Soft, +tender, nondistended, bowel sounds present  Musculoskeletal: No bilateral pedal edema noted  Skin: Normal  Psychiatry: Normal mood    Data Reviewed: Basic Metabolic Panel: Recent Labs  Lab 04/27/19 0201 04/27/19 1824 04/28/19 0109 04/30/19 0529 05/01/19 0553  NA 129* 126* 128* 133* 132*  K 4.4 4.9 4.8 4.2 4.0  CL 100 99 102 104 101  CO2 20* 20* 19* 21* 22  GLUCOSE 124* 301* 282* 119* 186*  BUN 27* 25* 27* 18 21  CREATININE 1.22 1.11 1.05 1.01 1.06  CALCIUM 8.0* 8.2* 8.1* 8.4* 8.6*   Liver Function Tests: Recent Labs  Lab 04/27/19 0201 04/27/19 1824 04/28/19 0109 04/30/19 0529 05/01/19 0553  AST 372* 261* 176* 95* 79*  ALT 263* 236* 196* 131* 118*  ALKPHOS 522* 625* 550* 436* 404*  BILITOT 5.9* 4.1* 2.9* 1.9* 1.7*  PROT 5.9* 6.1* 5.5* 5.7* 6.1*  ALBUMIN 2.3* 2.2* 2.0* 2.1* 2.3*   Recent Labs  Lab 04/25/19 1721 04/26/19 0532 04/27/19 0201 04/28/19 0109  LIPASE 2,207* 1,879* 1,537* 206*   No results for input(s): AMMONIA in the last 168 hours. CBC: Recent Labs  Lab 04/27/19 1824 04/28/19 0109 04/29/19 0758  04/30/19 0529 05/01/19 0553  WBC 10.6* 12.2* 12.1* 10.5 10.6*  NEUTROABS  --   --   --  6.9 7.6  HGB 12.1* 11.3* 12.2* 11.5* 11.4*  HCT 37.5* 34.1* 36.2* 34.9* 34.0*  MCV 94.0 93.7 93.3 92.8 92.1  PLT 295 304 358 331 332   Cardiac Enzymes:   No results for input(s): CKTOTAL, CKMB, CKMBINDEX, TROPONINI in the last 168 hours. BNP (last 3 results) Recent Labs    04/25/19 1730  BNP 128.3*    ProBNP (last 3 results) No results for input(s): PROBNP in the last 8760 hours.  CBG: Recent Labs  Lab 04/30/19 2037 05/01/19 0438 05/01/19 0738 05/01/19 1156 05/01/19 1558  GLUCAP 148* 174* 155* 189* 155*    Recent Results (from the past 240 hour(s))  SARS CORONAVIRUS 2 (TAT 6-24 HRS) Nasopharyngeal Nasopharyngeal Swab     Status: None   Collection Time: 04/25/19  7:59 PM   Specimen: Nasopharyngeal Swab  Result Value Ref Range Status   SARS Coronavirus 2 NEGATIVE NEGATIVE Final    Comment: (NOTE) SARS-CoV-2 target nucleic acids are NOT DETECTED. The SARS-CoV-2 RNA is generally detectable in upper and lower respiratory specimens during the acute phase of infection. Negative results do  not preclude SARS-CoV-2 infection, do not rule out co-infections with other pathogens, and should not be used as the sole basis for treatment or other patient management decisions. Negative results must be combined with clinical observations, patient history, and epidemiological information. The expected result is Negative. Fact Sheet for Patients: SugarRoll.be Fact Sheet for Healthcare Providers: https://www.woods-mathews.com/ This test is not yet approved or cleared by the [place] FDA and  has been authorized for detection and/or diagnosis of SARS-CoV-2 by FDA under an Emergency Use Authorization (EUA). This EUA will remain  in effect (meaning this test can be used) for the duration of the COVID-19 declaration under Section 56 4(b)(1) of the Act, 21  U.S.C. section 360bbb-3(b)(1), unless the authorization is terminated or revoked sooner. Performed at [facility], [place] [address], [place], [place] [address]   Culture, blood (Routine X 2) w Reflex to ID Panel     Status: None   Collection Time: 04/26/19  1:02 PM   Specimen: BLOOD  Result Value Ref Range Status   Specimen Description   Final    BLOOD RIGHT ANTECUBITAL Performed at [facility] [address], [place], [place] [address]    Special Requests   Final    BOTTLES DRAWN AEROBIC AND ANAEROBIC Blood Culture adequate volume Performed at [facility] [address], [place], [place] [address]    Culture   Final    NO GROWTH 5 DAYS Performed at [facility], [place] [address], [place], [place] [address]    Report Status 05/01/2019 FINAL  Final  Culture, blood (Routine X 2) w Reflex to ID Panel     Status: Abnormal   Collection Time: 04/26/19  1:02 PM   Specimen: BLOOD RIGHT HAND  Result Value Ref Range Status   Specimen Description   Final    BLOOD RIGHT HAND Performed at [facility] [address] [place], [place] [address]    Special Requests   Final    AEROBIC BOTTLE ONLY Blood Culture results may not be optimal due to an inadequate volume of blood received in culture bottles   Culture  Setup Time   Final    AEROBIC BOTTLE ONLY GRAM POSITIVE COCCI IN CLUSTERS CRITICAL RESULT CALLED TO, READ BACK BY AND VERIFIED WITH: L POINDEXTER PHARMD 04/27/19 1810 JDW    Culture (A)  Final    STAPHYLOCOCCUS SPECIES (COAGULASE NEGATIVE) THE SIGNIFICANCE OF ISOLATING THIS ORGANISM FROM A SINGLE SET OF BLOOD CULTURES WHEN MULTIPLE SETS ARE DRAWN IS UNCERTAIN. PLEASE NOTIFY THE MICROBIOLOGY DEPARTMENT WITHIN ONE WEEK IF SPECIATION AND SENSITIVITIES ARE REQUIRED. Performed at [facility], [place] [address] [place], [place] [address]    Report Status 04/29/2019 FINAL  Final     Studies: DG Abd Portable  1V  Result Date: 05/01/2019 CLINICAL DATA:  Assess for constipation EXAM: PORTABLE ABDOMEN - 1 VIEW COMPARISON:  None. FINDINGS: A few mildly prominent loops of small bowel are seen in the left abdomen measuring up to 3 cm. Moderate fecal loading is seen throughout the colon. No other abnormalities are identified. IMPRESSION: Mildly prominent loops of small bowel in the left abdomen could represent mild ileus or early obstruction in the appropriate clinical setting. Moderate fecal loading in the colon. Electronically Signed   By: Dorise Bullion III M.D   On: 05/01/2019 12:20     Alma Friendly MD [facility]    05/01/2019, 4:51 PM  LOS: 6 days

## 2019-05-01 NOTE — Progress Notes (Signed)
Patient refused suppository and miralax. MD notified. Patient has had two explosive BMs and states "he doesn't have any more bloating. If he feels like he needs it he will notify night RN". RN will continue to monitor.

## 2019-05-02 ENCOUNTER — Inpatient Hospital Stay (HOSPITAL_COMMUNITY): Payer: No Typology Code available for payment source

## 2019-05-02 DIAGNOSIS — Z7189 Other specified counseling: Secondary | ICD-10-CM

## 2019-05-02 DIAGNOSIS — R0602 Shortness of breath: Secondary | ICD-10-CM

## 2019-05-02 DIAGNOSIS — Z515 Encounter for palliative care: Secondary | ICD-10-CM

## 2019-05-02 DIAGNOSIS — R531 Weakness: Secondary | ICD-10-CM

## 2019-05-02 LAB — CBC WITH DIFFERENTIAL/PLATELET
Abs Immature Granulocytes: 0.73 10*3/uL — ABNORMAL HIGH (ref 0.00–0.07)
Abs Immature Granulocytes: 0.82 10*3/uL — ABNORMAL HIGH (ref 0.00–0.07)
Basophils Absolute: 0.1 10*3/uL (ref 0.0–0.1)
Basophils Absolute: 0.1 10*3/uL (ref 0.0–0.1)
Basophils Relative: 1 %
Basophils Relative: 0 %
Eosinophils Absolute: 0.3 10*3/uL (ref 0.0–0.5)
Eosinophils Absolute: 0.3 10*3/uL (ref 0.0–0.5)
Eosinophils Relative: 2 %
Eosinophils Relative: 2 %
HCT: 28.2 % — ABNORMAL LOW (ref 39.0–52.0)
HCT: 25.5 % — ABNORMAL LOW (ref 39.0–52.0)
Hemoglobin: 9.2 g/dL — ABNORMAL LOW (ref 13.0–17.0)
Hemoglobin: 8.5 g/dL — ABNORMAL LOW (ref 13.0–17.0)
Immature Granulocytes: 4 %
Immature Granulocytes: 5 %
Lymphocytes Relative: 13 %
Lymphocytes Relative: 13 %
Lymphs Abs: 2.2 10*3/uL (ref 0.7–4.0)
Lymphs Abs: 2.3 10*3/uL (ref 0.7–4.0)
MCH: 31 pg (ref 26.0–34.0)
MCH: 31.5 pg (ref 26.0–34.0)
MCHC: 32.6 g/dL (ref 30.0–36.0)
MCHC: 33.3 g/dL (ref 30.0–36.0)
MCV: 94.9 fL (ref 80.0–100.0)
MCV: 94.4 fL (ref 80.0–100.0)
Monocytes Absolute: 1.2 10*3/uL — ABNORMAL HIGH (ref 0.1–1.0)
Monocytes Absolute: 1.2 10*3/uL — ABNORMAL HIGH (ref 0.1–1.0)
Monocytes Relative: 7 %
Monocytes Relative: 7 %
Neutro Abs: 12.5 10*3/uL — ABNORMAL HIGH (ref 1.7–7.7)
Neutro Abs: 12.7 10*3/uL — ABNORMAL HIGH (ref 1.7–7.7)
Neutrophils Relative %: 73 %
Neutrophils Relative %: 73 %
Platelets: 320 10*3/uL (ref 150–400)
Platelets: 304 10*3/uL (ref 150–400)
RBC: 2.97 MIL/uL — ABNORMAL LOW (ref 4.22–5.81)
RBC: 2.7 MIL/uL — ABNORMAL LOW (ref 4.22–5.81)
RDW: 14.7 % (ref 11.5–15.5)
RDW: 14.6 % (ref 11.5–15.5)
WBC: 17 10*3/uL — ABNORMAL HIGH (ref 4.0–10.5)
WBC: 17.4 10*3/uL — ABNORMAL HIGH (ref 4.0–10.5)
nRBC: 0 % (ref 0.0–0.2)
nRBC: 0 % (ref 0.0–0.2)

## 2019-05-02 LAB — CBC
HCT: 23.6 % — ABNORMAL LOW (ref 39.0–52.0)
Hemoglobin: 7.7 g/dL — ABNORMAL LOW (ref 13.0–17.0)
MCH: 31.4 pg (ref 26.0–34.0)
MCHC: 32.6 g/dL (ref 30.0–36.0)
MCV: 96.3 fL (ref 80.0–100.0)
Platelets: 318 10*3/uL (ref 150–400)
RBC: 2.45 MIL/uL — ABNORMAL LOW (ref 4.22–5.81)
RDW: 14.7 % (ref 11.5–15.5)
WBC: 21.8 10*3/uL — ABNORMAL HIGH (ref 4.0–10.5)
nRBC: 0 % (ref 0.0–0.2)

## 2019-05-02 LAB — HEPARIN LEVEL (UNFRACTIONATED): Heparin Unfractionated: 0.52 IU/mL (ref 0.30–0.70)

## 2019-05-02 LAB — GLUCOSE, CAPILLARY
Glucose-Capillary: 159 mg/dL — ABNORMAL HIGH (ref 70–99)
Glucose-Capillary: 164 mg/dL — ABNORMAL HIGH (ref 70–99)
Glucose-Capillary: 182 mg/dL — ABNORMAL HIGH (ref 70–99)
Glucose-Capillary: 155 mg/dL — ABNORMAL HIGH (ref 70–99)

## 2019-05-02 LAB — COMPREHENSIVE METABOLIC PANEL
ALT: 92 U/L — ABNORMAL HIGH (ref 0–44)
AST: 63 U/L — ABNORMAL HIGH (ref 15–41)
Albumin: 2.1 g/dL — ABNORMAL LOW (ref 3.5–5.0)
Alkaline Phosphatase: 307 U/L — ABNORMAL HIGH (ref 38–126)
Anion gap: 7 (ref 5–15)
BUN: 38 mg/dL — ABNORMAL HIGH (ref 8–23)
CO2: 21 mmol/L — ABNORMAL LOW (ref 22–32)
Calcium: 8.5 mg/dL — ABNORMAL LOW (ref 8.9–10.3)
Chloride: 102 mmol/L (ref 98–111)
Creatinine, Ser: 0.99 mg/dL (ref 0.61–1.24)
GFR calc Af Amer: >60 mL/min (ref >60)
GFR calc non Af Amer: >60 mL/min (ref >60)
Glucose, Bld: 166 mg/dL — ABNORMAL HIGH (ref 70–99)
Potassium: 4.4 mmol/L (ref 3.5–5.1)
Sodium: 130 mmol/L — ABNORMAL LOW (ref 135–145)
Total Bilirubin: 1.1 mg/dL (ref 0.3–1.2)
Total Protein: 5 g/dL — ABNORMAL LOW (ref 6.5–8.1)

## 2019-05-02 LAB — OCCULT BLOOD X 1 CARD TO LAB, STOOL: Fecal Occult Bld: POSITIVE — AB

## 2019-05-02 LAB — LIPASE, BLOOD: Lipase: 95 U/L — ABNORMAL HIGH (ref 11–51)

## 2019-05-02 MED ORDER — IOHEXOL 9 MG/ML PO SOLN
ORAL | Status: AC
  Administered 2019-05-02: 1000 mL via ORAL
  Filled 2019-05-02: qty 1000

## 2019-05-02 MED ORDER — IOHEXOL 300 MG/ML  SOLN
100.0000 mL | Freq: Once | INTRAMUSCULAR | Status: AC | PRN
  Administered 2019-05-02: 100 mL via INTRAVENOUS

## 2019-05-02 MED ORDER — SODIUM CHLORIDE 0.9 % IV SOLN: INTRAVENOUS | Status: DC

## 2019-05-02 MED ORDER — SODIUM CHLORIDE (PF) 0.9 % IJ SOLN
INTRAMUSCULAR | Status: AC
  Filled 2019-05-02: qty 50

## 2019-05-02 MED ORDER — IOHEXOL 9 MG/ML PO SOLN: 1000.0000 mL | ORAL | Status: AC

## 2019-05-02 NOTE — Progress Notes (Signed)
PROGRESS NOTE  Dennis Stephens HUT:[id number] DOB: 1933/06/06 DOA: 04/25/2019 PCP: [facility]  Brief summary: Patient is a 86 year old male with history of hypertension, A. fib, recent diagnosis of PE, likely metastatic CA with spiculated pulmonary nodules presented with abdominal pain. Patient reported pain 10/10 in upper quadrants, nausea and vomiting.  Patient also reported abdominal swelling, yellowing of his eyes and under the tongue.  Patient was recently admitted (04/20/2019-04/22/2019) for chest pain and was found to have PE for which he was started on Xarelto.  Patient was also found to have hilar and mediastinal lymphadenopathy with possible liver mets, spiculated pulmonary nodules during previous admission, he was scheduled for EBUS and bronchoscopy, declined further work-up inpatient and was discharged at his request.  He is requesting evaluation for hospice and palliative care. In ED, patient was found to have elevated lipase, 2207, elevated AST ALT alk phos 569 with leukocytosis. Abdominal ultrasound showed normal gallbladder, biliary duct dilatation with choledocholithiasis, recommended ERCP. Patient admitted for further management.    Today, patient reports feeling nauseous, with some abdominal discomfort.  Noted to have some ileus on x-ray done.  Denies any vomiting, chest pain, shortness of breath, fever/chills.    Assessment/Plan: Principal Problem:   Acute pancreatitis Active Problems:   Pulmonary embolism (HCC)   Lung mass   Essential hypertension   Elevated LFTs   Bile duct stone   Acute pancreatitis likely 2/2 choledocholithiasis, acute transaminitis Currently afebrile, with rising leukocytosis Lipase trending down, 1879-->206 LFTs slowly trending down BC x2 NGTD GI on board, s/p ERCP with sphincterotomy, with CBD stone extraction on 3/16 by GI Dr. Henrene Pastor, GI signed off Continue IV Zosyn for now Daily CMP  Possible ileus Abdominal x-ray showed possible  ileus, possible early obstruction Reports abdominal discomfort, had a BM on 05/01/19 Clear liquid diet If worsening symptoms, will obtain a CT abdomen Limit narcotics, bowel regimen  Pulmonary embolism Likely due to possible underlying malignancy Currently on heparin drip, Xarelto on hold for now, plan to switch to 05/03/19, if hgb remains stable  Hilar and mediastinal lymphadenopathy, spiculated pulmonary nodules Seen on CT during previous admission, pulmonology was consulted S/P EBUS on 3/17, by PCCM, noted some intraoperative hemorrhage during procedure, 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  Further immunohistochemical stains currently pending Oncology consulted, plan for outpatient follow-up appointment  Hyponatremia Improving Likely due to poor p.o. intake, possible SIADH in the setting of cancer D/C IVF Daily CMP  AKI on CKD stage II Improved Creatinine peaked at 1.55, baseline around 1.1 Restart losartan Daily CMP  Essential hypertension BP stable Continue Coreg, restart losartan, hold norvasc  Diabetes mellitus type 2, NIDDM Hold Amaryl, continue sliding scale insulin  Tremor Continue primidone, hold if any worsening effects  Obesity Lifestyle modification advised  [place] Palliative/hospice team consulted as patient stated he would not proceed with any treatment for his malignancy regardless of what the final biopsy issues. Hospice team on board, plan for home hospice     Body mass index is 30.27 kg/m.    Code Status: DNR DVT Prophylaxis: home meds Xarelto held , on heparin drip   Family Communication: Discussed extensively with patient, spouse and sister at bedside  Disposition Plan:    Patient came from:         home  Anticipated d/c place:  Likely home hospice  Barriers to  d/c OR conditions which need to be met to effect a safe d/c: Awaiting TOC team for DME home hospice needs. On IV heaprin, drop in hgb   Consultants:  Pulmonary critical care  GI  Palliative care  Procedures:  ERCP on March 16  EBUS on 3/17  Antibiotics:  Zosyn   Objective: BP (!) 126/51 (BP Location: Left Arm)   Pulse 72   Temp 97.7 F (36.5 C) (Oral)   Resp 18   Ht '5\' 9"'  (1.753 m)   Wt 93 kg   SpO2 97%   BMI 30.27 kg/m   Intake/Output Summary (Last 24 hours) at 05/02/2019 1418 Last data filed at 05/02/2019 0900 Gross per 24 hour  Intake 360 ml  Output 550 ml  Net -190 ml   Filed Weights   04/25/19 1718 04/27/19 0842 04/28/19 1331  Weight: 96 kg 96 kg 93 kg    Exam:  General: NAD, acutely ill appearing   Cardiovascular: S1, S2 present  Respiratory:  Diminished breath sounds bilaterally  Abdomen: Soft, +tender, nondistended, bowel sounds present  Musculoskeletal: No bilateral pedal edema noted  Skin: Normal  Psychiatry: Normal mood    Data Reviewed: Basic Metabolic Panel: Recent Labs  Lab 04/27/19 1824 04/28/19 0109 04/30/19 0529 05/01/19 0553 05/02/19 0604  NA 126* 128* 133* 132* 130*  K 4.9 4.8 4.2 4.0 4.4  CL 99 102 104 101 102  CO2 20* 19* 21* 22 21*  GLUCOSE 301* 282* 119* 186* 166*  BUN 25* 27* 18 21 38*  CREATININE 1.11 1.05 1.01 1.06 0.99  CALCIUM 8.2* 8.1* 8.4* 8.6* 8.5*   Liver Function Tests: Recent Labs  Lab 04/27/19 1824 04/28/19 0109 04/30/19 0529 05/01/19 0553 05/02/19 0604  AST 261* 176* 95* 79* 63*  ALT 236* 196* 131* 118* 92*  ALKPHOS 625* 550* 436* 404* 307*  BILITOT 4.1* 2.9* 1.9* 1.7* 1.1  PROT 6.1* 5.5* 5.7* 6.1* 5.0*  ALBUMIN 2.2* 2.0* 2.1* 2.3* 2.1*   Recent Labs  Lab 04/25/19 1721 04/26/19 0532 04/27/19 0201 04/28/19 0109  LIPASE 2,207* 1,879* 1,537* 206*   No results for input(s): AMMONIA in the last 168 hours. CBC: Recent Labs  Lab 04/29/19 0758 04/30/19 0529 05/01/19 0553  05/02/19 0604 05/02/19 1407  WBC 12.1* 10.5 10.6* 17.0* 17.4*  NEUTROABS  --  6.9 7.6 12.5* 12.7*  HGB 12.2* 11.5* 11.4* 9.2* 8.5*  HCT 36.2* 34.9* 34.0* 28.2* 25.5*  MCV 93.3 92.8 92.1 94.9 94.4  PLT 358 331 332 320 304   Cardiac Enzymes:   No results for input(s): CKTOTAL, CKMB, CKMBINDEX, TROPONINI in the last 168 hours. BNP (last 3 results) Recent Labs    04/25/19 1730  BNP 128.3*    ProBNP (last 3 results) No results for input(s): PROBNP in the last 8760 hours.  CBG: Recent Labs  Lab 05/01/19 1558 05/01/19 2356 05/02/19 0407 05/02/19 0757 05/02/19 1146  GLUCAP 155* 156* 159* 164* 182*    Recent Results (from the past 240 hour(s))  SARS CORONAVIRUS 2 (TAT 6-24 HRS) Nasopharyngeal Nasopharyngeal Swab     Status: None   Collection Time: 04/25/19  7:59 PM   Specimen: Nasopharyngeal Swab  Result Value Ref Range Status   SARS Coronavirus 2 NEGATIVE NEGATIVE Final    Comment: (NOTE) SARS-CoV-2 target nucleic acids are NOT DETECTED. The SARS-CoV-2 RNA is generally detectable in upper and lower respiratory specimens during the acute phase of infection. Negative results do not  preclude SARS-CoV-2 infection, do not rule out co-infections with other pathogens, and should not be used as the sole basis for treatment or other patient management decisions. Negative results must be combined with clinical observations, patient history, and epidemiological information. The expected result is Negative. Fact Sheet for Patients: SugarRoll.be Fact Sheet for Healthcare Providers: https://www.woods-mathews.com/ This test is not yet approved or cleared by the [place] FDA and  has been authorized for detection and/or diagnosis of SARS-CoV-2 by FDA under an Emergency Use Authorization (EUA). This EUA will remain  in effect (meaning this test can be used) for the duration of the COVID-19 declaration under Section 56 4(b)(1) of the Act, 21  U.S.C. section 360bbb-3(b)(1), unless the authorization is terminated or revoked sooner. Performed at [facility], [place] [address], [place], [place] [address]   Culture, blood (Routine X 2) w Reflex to ID Panel     Status: None   Collection Time: 04/26/19  1:02 PM   Specimen: BLOOD  Result Value Ref Range Status   Specimen Description   Final    BLOOD RIGHT ANTECUBITAL Performed at [facility] [address], [place], [place] [address]    Special Requests   Final    BOTTLES DRAWN AEROBIC AND ANAEROBIC Blood Culture adequate volume Performed at [facility] [address], [place], [place] [address]    Culture   Final    NO GROWTH 5 DAYS Performed at [facility], [place] [address] [place], [place] [address]    Report Status 05/01/2019 FINAL  Final  Culture, blood (Routine X 2) w Reflex to ID Panel     Status: Abnormal   Collection Time: 04/26/19  1:02 PM   Specimen: BLOOD RIGHT HAND  Result Value Ref Range Status   Specimen Description   Final    BLOOD RIGHT HAND Performed at [facility] [address] [place], [place] [address]    Special Requests   Final    AEROBIC BOTTLE ONLY Blood Culture results may not be optimal due to an inadequate volume of blood received in culture bottles   Culture  Setup Time   Final    AEROBIC BOTTLE ONLY GRAM POSITIVE COCCI IN CLUSTERS CRITICAL RESULT CALLED TO, READ BACK BY AND VERIFIED WITH: L POINDEXTER PHARMD 04/27/19 1810 JDW    Culture (A)  Final    STAPHYLOCOCCUS SPECIES (COAGULASE NEGATIVE) THE SIGNIFICANCE OF ISOLATING THIS ORGANISM FROM A SINGLE SET OF BLOOD CULTURES WHEN MULTIPLE SETS ARE DRAWN IS UNCERTAIN. PLEASE NOTIFY THE MICROBIOLOGY DEPARTMENT WITHIN ONE WEEK IF SPECIATION AND SENSITIVITIES ARE REQUIRED. Performed at [facility], [place] [address] [place], [place] [address]    Report Status 04/29/2019 FINAL  Final     Studies: No results  found.   Alma Friendly MD [facility]    05/02/2019, 2:18 PM  LOS: 7 days

## 2019-05-02 NOTE — Progress Notes (Signed)
Patient refused Miralax stated "I been having enough BM to last me for 3 days, I dont want it". Will continue to monitor.

## 2019-05-02 NOTE — Consult Note (Signed)
Consultation Note Date: 05/02/2019   Patient Name: Dennis Stephens  DOB: 12-18-1933  MRN: [id number]  Age / Sex: 86 y.o., male  PCP: [facility] Referring Physician: Alma Friendly, MD  Reason for Consultation: Establishing goals of care  HPI/Patient Profile: 86 y.o. male admitted on 04/25/2019   Clinical Assessment and Goals of Care: Dennis Stephens lives at home with his wife in [place].  His sister is also visiting from [place] currently.  Both sister and wife are present at the bedside.  He is 41 years old.  He has past medical history significant for hypertension, atrial fibrillation recently diagnosed with pulmonary embolism recently diagnosed with metastatic cancer with spiculated pulmonary nodules after he presented with abdominal pain.  Currently he has been seen by medical oncology, remains admitted to hospital medicine service also has been found to have acute pancreatitis.  Palliative medicine consultation has been requested for goals of care discussions.  Dennis Stephens is awake alert resting in bed.  His wife and sister present at the bedside.  I introduced myself and palliative care as follows: Palliative medicine is specialized medical care for people living with serious illness. It focuses on providing relief from the symptoms and stress of a serious illness. The goal is to improve quality of life for both the patient and the family.  Goals of care: Broad aims of medical therapy in relation to the patient's values and preferences. Our aim is to provide medical care aimed at enabling patients to achieve the goals that matter most to them, given the circumstances of their particular medical situation and their constraints.   Goals wishes and values attempted to be explored.  Dennis Stephens states that he wants to go home with hospice support.  He states that he realizes he  has been diagnosed with metastatic cancer and also has other pertinent issues such as acute pancreatitis.  He complains of generalized discomfort and some shortness of breath along with nausea.  He has just been given morphine and nausea medication.  Hospice philosophy of care discussed with patient.  Answered all of his questions to the best of my ability.  Please note additional recommendations as listed below.  Thank you for the consult.  NEXT OF KIN Wife and sister.  He has 5 children 1 lives in [place], [place].  SUMMARY OF RECOMMENDATIONS   Agree with DO NOT RESUSCITATE Continue current mode of care Goals of care, disposition discussions: Discussed with patient about his serious life limiting illness and current mode of care.  Goals wishes and values attempted to be explored.  Patient does not wish to undergo any further diagnostic procedures.  He wants to be home with hospice support as soon as possible.  He is hopeful for discharge tomorrow on 3-22.  Will request transitions of care consult.  Continue current pain and nonpain symptom management. Thank you for the consult. Code Status/Advance Care Planning:  DNR    Symptom Management:      Palliative Prophylaxis:   Delirium  Protocol  Psycho-social/Spiritual:   Desire for further Chaplaincy support:yes  Additional Recommendations: Education on Hospice  Prognosis:   < 3 months  Discharge Planning: Home with Hospice      Primary Diagnoses: Present on Admission: . Acute pancreatitis . Pulmonary embolism ([place]) . Lung mass . Essential hypertension . Elevated LFTs   I have reviewed the medical record, interviewed the patient and family, and examined the patient. The following aspects are pertinent.  Past Medical History:  Diagnosis Date  . A-fib ([place])   . DM2 (diabetes mellitus, type 2) ([place])   . Hypertension    Social History   Socioeconomic History  . Marital status: Married    Spouse name: Not on  file  . Number of children: Not on file  . Years of education: Not on file  . Highest education level: Not on file  Occupational History  . Not on file  Tobacco Use  . Smoking status: Former Research scientist (life sciences)  . Smokeless tobacco: Never Used  Substance and Sexual Activity  . Alcohol use: Not Currently  . Drug use: Not Currently  . Sexual activity: Not Currently  Other Topics Concern  . Not on file  Social History Narrative  . Not on file   Social Determinants of Health   Financial Resource Strain:   . Difficulty of Paying Living Expenses:   Food Insecurity:   . Worried About Charity fundraiser in the Last Year:   . Arboriculturist in the Last Year:   Transportation Needs:   . Film/video editor (Medical):   Marland Kitchen Lack of Transportation (Non-Medical):   Physical Activity:   . Days of Exercise per Week:   . Minutes of Exercise per Session:   Stress:   . Feeling of Stress :   Social Connections:   . Frequency of Communication with Friends and Family:   . Frequency of Social Gatherings with Friends and Family:   . Attends Religious Services:   . Active Member of Clubs or Organizations:   . Attends Archivist Meetings:   Marland Kitchen Marital Status:    Family History  Problem Relation Age of Onset  . Hypertension Mother   . Hypertension Father   . Diabetes Mellitus II Neg Hx    Scheduled Meds: . alum & mag hydroxide-simeth  30 mL Oral TID PC  . amLODipine  5 mg Oral Daily  . bisacodyl  10 mg Rectal Once  . budesonide (PULMICORT) nebulizer solution  0.25 mg Nebulization BID  . carvedilol  12.5 mg Oral BID WC  . dextromethorphan-guaiFENesin  1 tablet Oral BID  . docusate sodium  100 mg Oral BID  . insulin aspart  0-15 Units Subcutaneous Q4H  . ipratropium-albuterol  3 mL Nebulization BID  . losartan  100 mg Oral Daily  . pantoprazole (PROTONIX) IV  40 mg Intravenous Q24H  . polyethylene glycol  17 g Oral Daily  . primidone  100 mg Oral Daily  . senna  1 tablet Oral BID  .  sertraline  100 mg Oral Daily  . sodium chloride flush  3 mL Intravenous Once   Continuous Infusions: . heparin 1,300 Units/hr (05/02/19 0833)  . lactated ringers 1,000 mL (04/27/19 0852)  . piperacillin-tazobactam (ZOSYN)  IV 3.375 g (05/02/19 0925)   PRN Meds:.albuterol, diphenhydrAMINE, guaiFENesin-dextromethorphan, hydrALAZINE, morphine injection, ondansetron (ZOFRAN) IV, phenol, temazepam Medications Prior to Admission:  Prior to Admission medications   Medication Sig Start Date End Date Taking? Authorizing Provider  amLODipine (  NORVASC) 2.5 MG tablet Take 2.5 mg by mouth daily.   Yes [provider]  atorvastatin (LIPITOR) 40 MG tablet Take 40 mg by mouth daily.   Yes [provider]  carvedilol (COREG) 12.5 MG tablet Take 12.5 mg by mouth 2 (two) times daily with a meal.   Yes [provider]  doxazosin (CARDURA) 8 MG tablet Take 8 mg by mouth daily.   Yes [provider]  glimepiride (AMARYL) 2 MG tablet Take 1 tablet (2 mg total) by mouth daily with breakfast. 04/22/19 04/21/20 Yes Rai, Ripudeep K, MD  loratadine (CLARITIN) 10 MG tablet Take 1 tablet (10 mg total) by mouth daily as needed for allergies or rhinitis. 04/22/19  Yes Rai, Ripudeep K, MD  losartan (COZAAR) 100 MG tablet Take 100 mg by mouth daily.   Yes [provider]  omeprazole (PRILOSEC) 20 MG capsule Take 20 mg by mouth daily.   Yes [provider]  primidone (MYSOLINE) 50 MG tablet Take 100 mg by mouth every morning.   Yes [provider]  Rivaroxaban 15 & 20 MG TBPK Follow package directions: Take one 15mg  tablet by mouth twice a day. On day 22 (05/13/19), switch to one 20mg  tablet once a day. Take with food. 04/22/19  Yes Rai, Ripudeep K, MD  sertraline (ZOLOFT) 100 MG tablet Take 100 mg by mouth daily.   Yes [provider]  temazepam (RESTORIL) 30 MG capsule Take 30 mg by mouth at bedtime as needed for sleep.   Yes [provider]    rivaroxaban (XARELTO) 20 MG TABS tablet Take 1 tablet (20 mg total) by mouth daily with supper. Start once the starter pack has been completed 05/13/19   Rai, Vernelle Emerald, MD   No Known Allergies Review of Systems Positive for generalized pain Positive for shortness of breath Positive for nausea Physical Exam Appears with generalized weakness and generalized discomfort Diminished breath sounds S1-S2 Awake alert oriented no focal deficits No edema Abdomen not distended Mood and affect within normal limits Skin is warm and dry  Vital Signs: BP (!) 139/58   Pulse 72   Temp 98 F (36.7 C) (Oral)   Resp 18   Ht 5\' 9"  (1.753 m)   Wt 93 kg   SpO2 96%   BMI 30.27 kg/m  Pain Scale: 0-10   Pain Score: 7    SpO2: SpO2: 96 % O2 Device:SpO2: 96 % O2 Flow Rate: .O2 Flow Rate (L/min): 2 L/min  IO: Intake/output summary:   Intake/Output Summary (Last 24 hours) at 05/02/2019 1054 Last data filed at 05/02/2019 0900 Gross per 24 hour  Intake 360 ml  Output 550 ml  Net -190 ml    LBM: Last BM Date: 04/30/19 Baseline Weight: Weight: 96 kg Most recent weight: Weight: 93 kg     Palliative Assessment/Data:   Palliative performance scale 40%  Time In: 9 Time Out: 10 Time Total: 60 Greater than 50%  of this time was spent counseling and coordinating care related to the above assessment and plan.  Signed by: Loistine Chance, MD   Please contact Palliative Medicine Team phone at [phone] for questions and concerns.  For individual provider: See Shea Evans

## 2019-05-02 NOTE — Progress Notes (Signed)
ANTICOAGULATION CONSULT NOTE - Initial Consult  Pharmacy Consult for heparin Indication: Recent PE, Xarelto on hold  No Known Allergies  Patient Measurements: Height: 5\' 9"  (175.3 cm) Weight: 205 lb (93 kg) IBW/kg (Calculated) : 70.7 Heparin Dosing Weight: 90 kg  Vital Signs: Temp: 98 F (36.7 C) (03/21 0404) Temp Source: Oral (03/21 0404) BP: 147/52 (03/21 0542) Pulse Rate: 74 (03/21 0404)  Labs: Recent Labs    04/30/19 0529 04/30/19 0529 04/30/19 1210 04/30/19 2247 05/01/19 0553 05/02/19 0604  HGB 11.5*   < >  --   --  11.4* 9.2*  HCT 34.9*  --   --   --  34.0* 28.2*  PLT 331  --   --   --  332 320  HEPARINUNFRC 0.63  --    < > 0.48 0.50 0.52  CREATININE 1.01  --   --   --  1.06 0.99   < > = values in this interval not displayed.    Estimated Creatinine Clearance: 63.7 mL/min (by C-G formula based on SCr of 0.99 mg/dL).   Medical History: Past Medical History:  Diagnosis Date  . A-fib ([place])   . DM2 (diabetes mellitus, type 2) ([place])   . Hypertension    Assessment: Pt is a 61 yoM with PMH significant for afib, recently diagnosed PE, recently diagnosed likely metastatic cancer with pulmonary nodules.   Pt was recently admitted from 04/20/19 - 04/22/19 at which time he was diagnosed with bilateral PE and prescribed rivaroxaban. Rivaroxaban 15 mg PO BID initiated on 3/11, last dose was given at 0815 on 04/26/19. Anticoagulation was converted to heparin drip on 3/15 for planned procedures.   Significant Events: -3/16: ERCP -3/17: Bronch  Today, 05/02/2019: - heparin level remains therapeutic at 0.52 - hgb down 9.2, plts stable - Per pt's RN, no bleeding noted   Goal of Therapy:  Heparin level 0.3-0.7 units/ml Monitor platelets by anticoagulation protocol: Yes   Plan:  - continue heparin drip at 1300 units/hr - daily heparin level - Monitor for signs and symptoms of bleeding - Follow along for transition back to rivaroxaban  Lynelle Doctor,  PharmD 05/02/19 9:09 AM

## 2019-05-02 NOTE — Progress Notes (Signed)
Patient Hgb 8.5 MD aware, new orders (see chart)   and MD advise to notified pharmacy d/t heparin drip. Pharmacy notified. Will continue to monitor.

## 2019-05-03 ENCOUNTER — Telehealth: Payer: Self-pay | Admitting: Medical Oncology

## 2019-05-03 DIAGNOSIS — D689 Coagulation defect, unspecified: Secondary | ICD-10-CM

## 2019-05-03 DIAGNOSIS — K921 Melena: Secondary | ICD-10-CM

## 2019-05-03 DIAGNOSIS — D62 Acute posthemorrhagic anemia: Secondary | ICD-10-CM

## 2019-05-03 DIAGNOSIS — K8033 Calculus of bile duct with acute cholangitis with obstruction: Secondary | ICD-10-CM

## 2019-05-03 LAB — CBC WITH DIFFERENTIAL/PLATELET
Abs Immature Granulocytes: 1.65 10*3/uL — ABNORMAL HIGH (ref 0.00–0.07)
Basophils Absolute: 0.1 10*3/uL (ref 0.0–0.1)
Basophils Relative: 1 %
Eosinophils Absolute: 0.4 10*3/uL (ref 0.0–0.5)
Eosinophils Relative: 1 %
HCT: 21.2 % — ABNORMAL LOW (ref 39.0–52.0)
Hemoglobin: 7 g/dL — ABNORMAL LOW (ref 13.0–17.0)
Immature Granulocytes: 6 %
Lymphocytes Relative: 8 %
Lymphs Abs: 2.2 10*3/uL (ref 0.7–4.0)
MCH: 31.4 pg (ref 26.0–34.0)
MCHC: 33 g/dL (ref 30.0–36.0)
MCV: 95.1 fL (ref 80.0–100.0)
Monocytes Absolute: 1.7 10*3/uL — ABNORMAL HIGH (ref 0.1–1.0)
Monocytes Relative: 7 %
Neutro Abs: 20.6 10*3/uL — ABNORMAL HIGH (ref 1.7–7.7)
Neutrophils Relative %: 77 %
Platelets: 340 10*3/uL (ref 150–400)
RBC: 2.23 MIL/uL — ABNORMAL LOW (ref 4.22–5.81)
RDW: 14.6 % (ref 11.5–15.5)
WBC: 26.6 10*3/uL — ABNORMAL HIGH (ref 4.0–10.5)
nRBC: 0 % (ref 0.0–0.2)

## 2019-05-03 LAB — COMPREHENSIVE METABOLIC PANEL
ALT: 79 U/L — ABNORMAL HIGH (ref 0–44)
AST: 54 U/L — ABNORMAL HIGH (ref 15–41)
Albumin: 1.9 g/dL — ABNORMAL LOW (ref 3.5–5.0)
Alkaline Phosphatase: 261 U/L — ABNORMAL HIGH (ref 38–126)
Anion gap: 6 (ref 5–15)
BUN: 54 mg/dL — ABNORMAL HIGH (ref 8–23)
CO2: 21 mmol/L — ABNORMAL LOW (ref 22–32)
Calcium: 8.1 mg/dL — ABNORMAL LOW (ref 8.9–10.3)
Chloride: 100 mmol/L (ref 98–111)
Creatinine, Ser: 1.29 mg/dL — ABNORMAL HIGH (ref 0.61–1.24)
GFR calc Af Amer: 59 mL/min — ABNORMAL LOW (ref >60)
GFR calc non Af Amer: 51 mL/min — ABNORMAL LOW (ref >60)
Glucose, Bld: 195 mg/dL — ABNORMAL HIGH (ref 70–99)
Potassium: 5.1 mmol/L (ref 3.5–5.1)
Sodium: 127 mmol/L — ABNORMAL LOW (ref 135–145)
Total Bilirubin: 1.1 mg/dL (ref 0.3–1.2)
Total Protein: 4.8 g/dL — ABNORMAL LOW (ref 6.5–8.1)

## 2019-05-03 LAB — GLUCOSE, CAPILLARY
Glucose-Capillary: 147 mg/dL — ABNORMAL HIGH (ref 70–99)
Glucose-Capillary: 159 mg/dL — ABNORMAL HIGH (ref 70–99)
Glucose-Capillary: 175 mg/dL — ABNORMAL HIGH (ref 70–99)
Glucose-Capillary: 177 mg/dL — ABNORMAL HIGH (ref 70–99)
Glucose-Capillary: 184 mg/dL — ABNORMAL HIGH (ref 70–99)
Glucose-Capillary: 187 mg/dL — ABNORMAL HIGH (ref 70–99)
Glucose-Capillary: 187 mg/dL — ABNORMAL HIGH (ref 70–99)
Glucose-Capillary: 202 mg/dL — ABNORMAL HIGH (ref 70–99)
Glucose-Capillary: 225 mg/dL — ABNORMAL HIGH (ref 70–99)

## 2019-05-03 LAB — ABO/RH: ABO/RH(D): O POS

## 2019-05-03 LAB — HEMOGLOBIN AND HEMATOCRIT, BLOOD
HCT: 24.3 % — ABNORMAL LOW (ref 39.0–52.0)
Hemoglobin: 8.2 g/dL — ABNORMAL LOW (ref 13.0–17.0)

## 2019-05-03 LAB — SURGICAL PATHOLOGY

## 2019-05-03 LAB — CYTOLOGY - NON PAP

## 2019-05-03 LAB — PREPARE RBC (CROSSMATCH)

## 2019-05-03 LAB — HEPARIN LEVEL (UNFRACTIONATED): Heparin Unfractionated: 0.55 IU/mL (ref 0.30–0.70)

## 2019-05-03 MED ORDER — SODIUM CHLORIDE 0.9% IV SOLUTION: Freq: Once | INTRAVENOUS | Status: AC

## 2019-05-03 MED ORDER — SODIUM CHLORIDE 0.9% FLUSH: 10.0000 mL | INTRAVENOUS | Status: DC | PRN

## 2019-05-03 MED ORDER — PANTOPRAZOLE SODIUM 40 MG IV SOLR
40.0000 mg | Freq: Two times a day (BID) | INTRAVENOUS | Status: DC
  Administered 2019-05-03 – 2019-05-05 (×5): 40 mg via INTRAVENOUS
  Filled 2019-05-03 (×5): qty 40

## 2019-05-03 MED ORDER — SODIUM CHLORIDE 0.9% FLUSH
10.0000 mL | Freq: Two times a day (BID) | INTRAVENOUS | Status: DC
  Administered 2019-05-04 – 2019-05-05 (×2): 10 mL

## 2019-05-03 NOTE — Progress Notes (Signed)
PT Cancellation Note  Patient Details Name: Dennis Stephens MRN: [id number] DOB: [birth year]   Cancelled Treatment:    Reason Eval/Treat Not Completed: Medical issues which prohibited therapy;Other (comment)(Pt is about to recieve blood transfusion. Rn reports he has been feeling weak. Will follow up at later date/time once transfusion is complete and as schedule allows.)   Verner Mould, DPT Physical Therapist with [facility] [phone]  05/03/2019 11:57 AM

## 2019-05-03 NOTE — Progress Notes (Signed)
PROGRESS NOTE  Dennis Stephens BSW:[id number] DOB: December 04, 1933 DOA: 04/25/2019 PCP: [facility]  Brief summary: Patient is a 86 year old male with history of hypertension, A. fib, recent diagnosis of PE, likely metastatic CA with spiculated pulmonary nodules presented with abdominal pain. Patient reported pain 10/10 in upper quadrants, nausea and vomiting.  Patient also reported abdominal swelling, yellowing of his eyes and under the tongue.  Patient was recently admitted (04/20/2019-04/22/2019) for chest pain and was found to have PE for which he was started on Xarelto.  Patient was also found to have hilar and mediastinal lymphadenopathy with possible liver mets, spiculated pulmonary nodules during previous admission, he was scheduled for EBUS and bronchoscopy, declined further work-up inpatient and was discharged at his request.  He is requesting evaluation for hospice and palliative care. In ED, patient was found to have elevated lipase, 2207, elevated AST ALT alk phos 569 with leukocytosis. Abdominal ultrasound showed normal gallbladder, biliary duct dilatation with choledocholithiasis, recommended ERCP. Patient admitted for further management.     Today, patient continues to report mild discomfort, more in the right upper quadrant, some nausea but denies any vomiting, denies any chest pain.  Patient noted to have a drop in hemoglobin, elevated BUN, FOBT positive.  Plan to transfuse 2 units of PRBC.  GI consulted.    Assessment/Plan: Principal Problem:   Acute pancreatitis Active Problems:   Pulmonary embolism (HCC)   Lung mass   Essential hypertension   Elevated LFTs   Bile duct stone   Acute pancreatitis likely 2/2 choledocholithiasis, acute transaminitis Currently afebrile, with rising leukocytosis Lipase trending down, 1879-->206 LFTs slowly trending down BC x2 NGTD GI on board, s/p ERCP with sphincterotomy, with CBD stone extraction on 3/16 by GI Dr. Henrene Pastor, GI signed  off Continue IV Zosyn for now Daily CMP  Likely upper GI bleed Reported one episode of dark tarry stool on 05/02/2019, FOBT positive, BUN elevated Was on IV Heparin for PE Hemoglobin down to 7, from around 11-12 Transfuse 2 units of PRBC on 05/03/2019, Protonix twice daily GI consulted, plan for possible EGD Start IV fluids Hold heparin for now Frequent CBC, monitor closely  Acute blood loss anemia Likely due to above Management as above  Possible ileus Abdominal x-ray showed possible ileus, possible early obstruction Reports abdominal discomfort, had a BM on 05/02/19 NPO Limit narcotics, bowel regimen  Pulmonary embolism Likely due to possible underlying malignancy Hold IV heparin for now  Hilar and mediastinal lymphadenopathy, spiculated pulmonary nodules Seen on CT during previous admission, pulmonology was consulted S/P EBUS on 3/17, by PCCM, noted some intraoperative hemorrhage during procedure, 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  Further immunohistochemical stains currently pending Oncology consulted, plan for outpatient follow-up appointment  Acute hypoxic respiratory failure Likely multifactorial, due to above (PE, possible lung CA) Supplemental O2 for now Duo nebs, incentive spirometry, cough suppressant  Hyponatremia Improving Likely due to poor p.o. intake, possible SIADH in the setting of cancer D/C IVF Daily CMP  AKI on CKD stage II Ongoing likely to GI bleed Creatinine peaked at 1.55, baseline around 1.1 Hold losartan Daily CMP  Essential hypertension BP soft Hold Coreg, losartan, norvasc  Diabetes mellitus type 2, NIDDM Hold Amaryl, continue sliding scale insulin  Tremor Continue primidone, hold if any worsening effects  Obesity Lifestyle modification advised  [place] Palliative/hospice team consulted as patient stated he would not proceed with any treatment for his  malignancy regardless of  what the final biopsy issues. Hospice team on board, plan for home hospice once stable/work-up     Body mass index is 30.27 kg/m.    Code Status: DNR DVT Prophylaxis:  SCDs   Family Communication: Discussed extensively with patient, spouse at bedside on 05/03/2019  Disposition Plan:    Patient came from:         home                                                                                                 Anticipated d/c place:  Likely home hospice  Barriers to d/c OR conditions which need to be met to effect a safe d/c: Currently having GI bleed, awaiting GI consultation for possible EGD, awaiting TOC team for DME home hospice needs   Consultants:  Pulmonary critical care  GI  Palliative care  Procedures:  ERCP on March 16  EBUS on 3/17  Antibiotics:  Zosyn   Objective: BP (!) 145/43   Pulse 75   Temp 98.3 F (36.8 C) (Oral)   Resp 20   Ht '5\' 9"'  (1.753 m)   Wt 93 kg   SpO2 98%   BMI 30.27 kg/m   Intake/Output Summary (Last 24 hours) at 05/03/2019 1547 Last data filed at 05/03/2019 1315 Gross per 24 hour  Intake 1059.5 ml  Output 850 ml  Net 209.5 ml   Filed Weights   04/25/19 1718 04/27/19 0842 04/28/19 1331  Weight: 96 kg 96 kg 93 kg    Exam:  General: NAD, acutely ill appearing   Cardiovascular: S1, S2 present  Respiratory:  Diminished breath sounds bilaterally  Abdomen: Soft, +tender, nondistended, bowel sounds present  Musculoskeletal: No bilateral pedal edema noted  Skin: Normal  Psychiatry: Normal mood    Data Reviewed: Basic Metabolic Panel: Recent Labs  Lab 04/28/19 0109 04/30/19 0529 05/01/19 0553 05/02/19 0604 05/03/19 0541  NA 128* 133* 132* 130* 127*  K 4.8 4.2 4.0 4.4 5.1  CL 102 104 101 102 100  CO2 19* 21* 22 21* 21*  GLUCOSE 282* 119* 186* 166* 195*  BUN 27* 18 21 38* 54*  CREATININE 1.05 1.01 1.06 0.99 1.29*  CALCIUM 8.1* 8.4* 8.6* 8.5* 8.1*   Liver Function  Tests: Recent Labs  Lab 04/28/19 0109 04/30/19 0529 05/01/19 0553 05/02/19 0604 05/03/19 0541  AST 176* 95* 79* 63* 54*  ALT 196* 131* 118* 92* 79*  ALKPHOS 550* 436* 404* 307* 261*  BILITOT 2.9* 1.9* 1.7* 1.1 1.1  PROT 5.5* 5.7* 6.1* 5.0* 4.8*  ALBUMIN 2.0* 2.1* 2.3* 2.1* 1.9*   Recent Labs  Lab 04/27/19 0201 04/28/19 0109 05/02/19 0604  LIPASE 1,537* 206* 95*   No results for input(s): AMMONIA in the last 168 hours. CBC: Recent Labs  Lab 04/30/19 0529 04/30/19 0529 05/01/19 0553 05/02/19 0604 05/02/19 1407 05/02/19 2059 05/03/19 0541  WBC 10.5   < > 10.6* 17.0* 17.4* 21.8* 26.6*  NEUTROABS 6.9  --  7.6 12.5* 12.7*  --  20.6*  HGB 11.5*   < > 11.4* 9.2* 8.5* 7.7* 7.0*  HCT 34.9*   < >  34.0* 28.2* 25.5* 23.6* 21.2*  MCV 92.8   < > 92.1 94.9 94.4 96.3 95.1  PLT 331   < > 332 320 304 318 340   < > = values in this interval not displayed.   Cardiac Enzymes:   No results for input(s): CKTOTAL, CKMB, CKMBINDEX, TROPONINI in the last 168 hours. BNP (last 3 results) Recent Labs    04/25/19 1730  BNP 128.3*    ProBNP (last 3 results) No results for input(s): PROBNP in the last 8760 hours.  CBG: Recent Labs  Lab 05/02/19 2014 05/03/19 0010 05/03/19 0403 05/03/19 0738 05/03/19 1131  GLUCAP 147* 159* 177* 187* 175*    Recent Results (from the past 240 hour(s))  SARS CORONAVIRUS 2 (TAT 6-24 HRS) Nasopharyngeal Nasopharyngeal Swab     Status: None   Collection Time: 04/25/19  7:59 PM   Specimen: Nasopharyngeal Swab  Result Value Ref Range Status   SARS Coronavirus 2 NEGATIVE NEGATIVE Final    Comment: (NOTE) SARS-CoV-2 target nucleic acids are NOT DETECTED. The SARS-CoV-2 RNA is generally detectable in upper and lower respiratory specimens during the acute phase of infection. Negative results do not preclude SARS-CoV-2 infection, do not rule out co-infections with other pathogens, and should not be used as the sole basis for treatment or other patient  management decisions. Negative results must be combined with clinical observations, patient history, and epidemiological information. The expected result is Negative. Fact Sheet for Patients: SugarRoll.be Fact Sheet for Healthcare Providers: https://www.woods-mathews.com/ This test is not yet approved or cleared by the [place] FDA and  has been authorized for detection and/or diagnosis of SARS-CoV-2 by FDA under an Emergency Use Authorization (EUA). This EUA will remain  in effect (meaning this test can be used) for the duration of the COVID-19 declaration under Section 56 4(b)(1) of the Act, 21 U.S.C. section 360bbb-3(b)(1), unless the authorization is terminated or revoked sooner. Performed at [facility], [place] [address] [place], [place] [address]   Culture, blood (Routine X 2) w Reflex to ID Panel     Status: None   Collection Time: 04/26/19  1:02 PM   Specimen: BLOOD  Result Value Ref Range Status   Specimen Description   Final    BLOOD RIGHT ANTECUBITAL Performed at [facility] [address] [place], [place] [address]    Special Requests   Final    BOTTLES DRAWN AEROBIC AND ANAEROBIC Blood Culture adequate volume Performed at [facility] [address] [place], [place] [address]    Culture   Final    NO GROWTH 5 DAYS Performed at [facility], [place] [address], [place], [place] [address]    Report Status 05/01/2019 FINAL  Final  Culture, blood (Routine X 2) w Reflex to ID Panel     Status: Abnormal   Collection Time: 04/26/19  1:02 PM   Specimen: BLOOD RIGHT HAND  Result Value Ref Range Status   Specimen Description   Final    BLOOD RIGHT HAND Performed at [facility] [address], [place], [place] [address]    Special Requests   Final    AEROBIC BOTTLE ONLY Blood Culture results may not be optimal due to an inadequate volume of blood received  in culture bottles   Culture  Setup Time   Final    AEROBIC BOTTLE ONLY GRAM POSITIVE COCCI IN CLUSTERS CRITICAL RESULT CALLED TO, READ BACK BY AND VERIFIED WITH: L POINDEXTER  PHARMD 04/27/19 1810 JDW    Culture (A)  Final    STAPHYLOCOCCUS SPECIES (COAGULASE NEGATIVE) THE SIGNIFICANCE OF ISOLATING THIS ORGANISM FROM A SINGLE SET OF BLOOD CULTURES WHEN MULTIPLE SETS ARE DRAWN IS UNCERTAIN. PLEASE NOTIFY THE MICROBIOLOGY DEPARTMENT WITHIN ONE WEEK IF SPECIATION AND SENSITIVITIES ARE REQUIRED. Performed at [facility], [place] [address], [place], [place] [address]    Report Status 04/29/2019 FINAL  Final     Studies: CT ABDOMEN PELVIS W CONTRAST  Result Date: 05/02/2019 CLINICAL DATA:  86 year old male with history of nausea and vomiting. Suspected bowel obstruction or abdominal abscess/infection. EXAM: CT ABDOMEN AND PELVIS WITH CONTRAST TECHNIQUE: Multidetector CT imaging of the abdomen and pelvis was performed using the standard protocol following bolus administration of intravenous contrast. CONTRAST:  168m OMNIPAQUE IOHEXOL 300 MG/ML  SOLN COMPARISON:  CT the abdomen and pelvis 04/20/2019. FINDINGS: Lower chest: Trace bilateral pleural effusions lying dependently. Atherosclerotic calcifications in the descending thoracic aorta as well as the right coronary artery. Mild fibrotic changes in the lung bases bilaterally, suggestive of nonspecific interstitial pneumonia. Hepatobiliary: Ill-defined hypovascular areas in the liver, best demonstrated by a 1.6 x 1.5 cm lesion in segment 4B (axial image 21 of series 2), similar to the recent prior examination. No intra or extrahepatic biliary ductal dilatation. Gallbladder is nearly completely decompressed, but the gallbladder wall appears thickened with avid enhancement of the gallbladder mucosa and mild diffuse gallbladder wall edema and subtle surrounding inflammatory changes. Pancreas: No pancreatic mass. No pancreatic ductal dilatation. No  pancreatic or peripancreatic fluid collections or inflammatory changes. Spleen: Unremarkable. Adrenals/Urinary Tract: Low-attenuation lesions in the right kidney, compatible with simple cysts, largest of which is an exophytic lesion in the upper pole measuring 4.8 cm. Other subcentimeter low-attenuation lesions in the kidneys bilaterally, too small to characterize, but statistically likely to represent tiny cysts. Bilateral adrenal glands are normal in appearance. No hydroureteronephrosis. Urinary bladder is normal in appearance. Stomach/Bowel: Normal appearance of the stomach. No pathologic dilatation of small bowel or colon. Numerous colonic diverticulae are noted, particularly in the descending colon and sigmoid colon, without surrounding inflammatory changes to suggest an acute diverticulitis at this time. Normal appendix. Vascular/Lymphatic: Aortic atherosclerosis. 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 No lymphadenopathy noted in the abdomen or pelvis. No lymphadenopathy noted in the abdomen or pelvis. Reproductive: Prostate gland and seminal vesicles are unremarkable in appearance. Other: No significant volume of ascites.  No pneumoperitoneum. Musculoskeletal: There are no aggressive appearing lytic or blastic lesions noted in the visualized portions of the skeleton. IMPRESSION: 1. Although the gallbladder is nearly decompressed, and there are no definite calcified gallstones, the gallbladder wall appears thickened and edematous with hyperenhancement and subtle surrounding inflammatory changes. Clinical correlation for signs and symptoms of a calculus cholecystitis is suggested. 2. Ill-defined hepatic lesions again noted, the possibility of metastatic disease should be considered. This could be further evaluated with nonemergent abdominal MRI with and without IV gadolinium if clinically appropriate. 3. Colonic  diverticulosis without evidence of acute diverticulitis at this time. 4. 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 5. Trace bilateral pleural effusions lying dependently. 6. The appearance of the lung bases suggests interstitial lung disease such as nonspecific interstitial pneumonia (NSIP). Outpatient referral to Pulmonology should be considered in the near future to for further evaluation. 7. Additional incidental  findings, as above. Electronically Signed   By: Vinnie Langton M.D.   On: 05/02/2019 18:44     Alma Friendly MD [facility]    05/03/2019, 3:47 PM  LOS: 8 days

## 2019-05-03 NOTE — Progress Notes (Addendum)
ANTICOAGULATION CONSULT NOTE - Follow Up Consult  Pharmacy Consult for Heparin Indication: Recent PE, Xarelto on hold  No Known Allergies  Patient Measurements: Height: 5\' 9"  (175.3 cm) Weight: 205 lb (93 kg) IBW/kg (Calculated) : 70.7 Heparin Dosing Weight: 90 kg  Vital Signs: Temp: 97.6 F (36.4 C) (03/22 0400) Temp Source: Oral (03/22 0400) BP: 101/87 (03/22 0400) Pulse Rate: 72 (03/22 0400)  Labs: Recent Labs    05/01/19 0553 05/01/19 0553 05/02/19 0604 05/02/19 0604 05/02/19 1407 05/02/19 1407 05/02/19 2059 05/03/19 0541  HGB 11.4*   < > 9.2*   < > 8.5*   < > 7.7* 7.0*  HCT 34.0*   < > 28.2*   < > 25.5*  --  23.6* 21.2*  PLT 332   < > 320   < > 304  --  318 340  HEPARINUNFRC 0.50  --  0.52  --   --   --   --  0.55  CREATININE 1.06  --  0.99  --   --   --   --  1.29*   < > = values in this interval not displayed.    Estimated Creatinine Clearance: 48.9 mL/min (A) (by C-G formula based on SCr of 1.29 mg/dL (H)).   Medications:  Infusions:  . sodium chloride Stopped (05/02/19 1510)  . heparin 1,300 Units/hr (05/03/19 0629)  . lactated ringers 1,000 mL (04/27/19 7903)    Assessment: Pt is a 26 yoM with PMH significant for afib, recently diagnosed PE, recently diagnosed likely metastatic cancer with pulmonary nodules.  Pt was recently admitted from 04/20/19 - 04/22/19 at which time he was diagnosed with bilateral PE and prescribed rivaroxaban. Rivaroxaban 15 mg PO BID initiated on 3/11, last dose was given at 0815 on 04/26/19. Anticoagulation was converted to heparin drip on 3/15 for planned procedures.   Significant Events: -3/16: ERCP -3/17: Bronch - note that after transbronchial biopsy there was significant bleeding from the left upper lobe.  Today, 05/03/2019: - Heparin level remains therapeutic at 0.55 - CBC:  Hgb decreased to 7, Plt remain WNL. - Per pt's RN, Jerrye Beavers, no bleeding noted.  However, FOB positive on 3/21.  RN to contact MD this morning. - SCr  increased 1 > 1.29  Goal of Therapy:  Heparin level 0.3-0.7 units/ml Monitor platelets by anticoagulation protocol: Yes   Plan:   Continue heparin IV infusion at 1300 units/hr  Daily heparin level and CBC  Close monitoring for any s/s bleeding  Follow up anemia/anticoagulation plans with MD  Follow up plans for transition back to Xarelto and discharge with home hospice.   Gretta Arab PharmD, BCPS Pager: [phone] 05/03/2019 7:51 AM    Addendum, 8:20 AM Per RN, verbal order from Dr. Clide Cliff to d/c heparin drip at this time.  RN to stop heparin infusion at this time. MD also placed orders for Pantoprazole IV, PRBC transfusion. Follow up anticoagulation plans.  Gretta Arab PharmD, BCPS Pager: [phone] 05/03/2019 8:22 AM

## 2019-05-03 NOTE — Progress Notes (Signed)
Morning meds held per MD order. Will continue to monitor patient.

## 2019-05-03 NOTE — Progress Notes (Signed)
Occupational Therapy Treatment Patient Details Name: Dennis Stephens MRN: [id number] DOB: 1933/04/01 Today's Date: 05/03/2019    History of present illness Patient is a 86 year old male with history of hypertension, A. fib, recent diagnosis of PE, likely metastatic CA with spiculated pulmonary nodules presented with abdominal pain. Dx of acute pancreatitis.  Pt s/p ECRP on 04/27/19.    OT comments  Pt agreeable to grooming and BUE AROM with OT. Pt not feeling like OOB at thist ime  Follow Up Recommendations  Home health OT;Supervision/Assistance - 24 hour    Equipment Recommendations  3 in 1 bedside commode    Recommendations for Other Services      Precautions / Restrictions Precautions Precautions: Fall Restrictions Weight Bearing Restrictions: No       Mobility Bed Mobility Overal bed mobility: Needs Assistance Bed Mobility: Supine to Sit;Sit to Supine     Supine to sit: Supervision;HOB elevated Sit to supine: Min assist   General bed mobility comments: pt takes extra time and effort to move to EOB. HOB elevated. Pt asat EOB for ~ 6 minutes to complete cervical exercises as he c/o neck pain and weakness once sitting up. Pt BP dropped in sitting and he reported dizziness. Reruned to supine. Assist required to raise LE's into bed.  Transfers                 General transfer comment: pt declined to perform.    Balance Overall balance assessment: Needs assistance Sitting-balance support: Feet supported;Bilateral upper extremity supported Sitting balance-Leahy Scale: Fair                                     ADL either performed or assessed with clinical judgement   ADL Overall ADL's : Needs assistance/impaired     Grooming: Oral care;Wash/dry face;Supervision/safety Grooming Details (indicate cue type and reason): HOB raised                               General ADL Comments: pt not willing to get OOB but did agree to BUE  exercise.               Cognition Arousal/Alertness: Awake/alert Behavior During Therapy: WFL for tasks assessed/performed Overall Cognitive Status: Within Functional Limits for tasks assessed                                 General Comments: pt with depressed demeanor throughout session        Exercises Exercises: Other exercises General Exercises - Upper Extremity Shoulder Flexion: AROM;10 reps;Both Shoulder Extension: AROM;Both;10 reps Shoulder ABduction: AROM;10 reps;Both Shoulder Horizontal ABduction: AROM;10 reps;Both Elbow Flexion: AROM;Both;10 reps Elbow Extension: AROM;Both;10 reps Wrist Extension: AROM;Both;10 reps Digit Composite Flexion: AROM;Both            Pertinent Vitals/ Pain       Pain Assessment: No/denies pain     Prior Functioning/Environment              Frequency  Min 2X/week        Progress Toward Goals  OT Goals(Stephens goals can now be found in the care plan section)  Progress towards OT goals: Progressing toward goals  Acute Rehab OT Goals Patient Stated Goal: to go home   Plan  AM-PAC OT "6 Clicks" Daily Activity     Outcome Measure   Help from another person eating meals?: A Little Help from another person taking care of personal grooming?: A Little Help from another person toileting, which includes using toliet, bedpan, or urinal?: A Little Help from another person bathing (including washing, rinsing, drying)?: A Lot Help from another person to put on and taking off regular upper body clothing?: A Little Help from another person to put on and taking off regular lower body clothing?: A Lot 6 Click Score: 16    End of Session    OT Visit Diagnosis: Unsteadiness on feet (R26.81)   Activity Tolerance Patient limited by fatigue   Patient Left in bed;with call bell/phone within reach;with bed alarm set   Nurse Communication Mobility status        Time: 4862-8241 OT Time Calculation (min):  10 min  Charges: OT General Charges $OT Visit: 1 Visit OT Treatments $Therapeutic Activity: 8-22 mins  Kari Baars, OT Acute Rehabilitation Services [phone] Office- [phone]      Dennis Stephens, [name] 05/03/2019, 5:30 PM

## 2019-05-03 NOTE — Telephone Encounter (Signed)
Spoke to Abundio Miu, MSW at [place] and she said [facility]ts [facility] is Dr Selinda Eon and to FAX hospice request to him at [phone] to Attention Dr Laveda Abbe. If you need  his phone number call Marquita back. This information was give to [place] at hospice.

## 2019-05-03 NOTE — Telephone Encounter (Signed)
I will not be the attending for hospice. Thank you

## 2019-05-03 NOTE — TOC Progression Note (Signed)
Transition of Care [facility]) - Progression Note    Patient Details  Name: [name] MRN: [id number] Date of Birth: [birth year]  Transition of Care [facility]) CM/SW [place], [place] Phone Number: 05/03/2019, 10:29 AM  Clinical Narrative:  Met with patient, wife at bedside, to confirm plans for hospice care and identify agency.  They called their son, who had spoken with hospice recently.  Donnie confirmed plan of working with [facility]  Contacted Venia Carbon, who will follow up with family to hammer out details. TOC will continue to follow during the course of hospitalization.      Expected Discharge Plan: Home w Hospice Care Barriers to Discharge: No Barriers Identified  Expected Discharge Plan and Services Expected Discharge Plan: [place] In-house Referral: Clinical Social Work     Living arrangements for the past 2 months: Single Family Home Expected Discharge Date: (unknown)                                     Social Determinants of Health (SDOH) Interventions    Readmission Risk Interventions Readmission Risk Prevention Plan 04/29/2019  Transportation Screening Complete  PCP or Specialist Appt within 5-7 Days Complete  Home Care Screening Complete  Medication Review (RN CM) Complete

## 2019-05-03 NOTE — Progress Notes (Signed)
Blood Transfusion started patient stable

## 2019-05-03 NOTE — Progress Notes (Addendum)
Progress Note    ASSESSMENT AND PLAN:   Brief history:  86 yo male with PMH significant for AFIB, HTN, CKD, DM, CAD and hyperlipidemia. He has a complicated recent medical history. Briefly, he was admitted on 3/10 with chest pain. Found to have bilateral PEs and lung mass. Started on Xarelto, he declined bronchoscopy in favor of following up at [facility]. He was readmitted on 3/14 with abdominal pain, we saw him in consult  for gallstone pancreatitis. He underwent ERCP ( Dr. Henrene Pastor) with sphincterotomy 3/16 with extraction of stones / purulent bile. . Abdominal pain and liver chemistries improved. We signed off case on 04/28/19.  Since then he did undergo bronchoscopy and likely has metastatic lung cancer.   A couple of days ago patient began bloody stools ( melena). He describes abdominal pain but has had same pancreatitis type pain since admission, not new pain associated with the bleeding. He has had some nausea / vomiting but emesis non-bloody / no coffee ground emesis. His hgb has continuously declined since onset of bleeding from mid 11 to 7.0 today. Last black stools / episode of bleeding was yesterday.   # Upper GI bleed on IV heparin.  --? Etiology?  Post-sphincterotomy bleed seems unlikely given interval time period between procedure and bleeding.  --Last episode of bleeding was yesterday --"black", FOBT+ stool, elevated BUN  --Heparin now on hold --Continue BID IV PPI --Transfused a unit of pRPBC earlier this am. Repeat CBC ordered --He is already NPO  --Will probably need EGD vr small bowel enteroscopy.   # biliary pancreatitis --s/p stone extraction / sphincterotomy this admission --Liver tests have continued to improve --Radiographic improvement. No findings of pancreatitis on CT scan  abd/ pelvis with contrast yesterday.   # PE --IV heparin on hold  # Goals of Care --Palliative / Hospice team evaluated. Patient doesn't want treatment for lung  cancer   SUBJECTIVE    Wife at bedside. No GI bleeding today. Still has intermittent RUQ pain/    OBJECTIVE:     Vital signs in last 24 hours: Temp:  [97.6 F (36.4 C)-98.5 F (36.9 C)] 97.6 F (36.4 C) (03/22 1003) Pulse Rate:  [66-76] 75 (03/22 1003) Resp:  [18-20] 20 (03/22 1003) BP: (101-145)/(41-87) 145/73 (03/22 1003) SpO2:  [95 %-100 %] 100 % (03/22 1003) Last BM Date: 05/02/19 General:   Alert, well-developed male in NAD EENT:  Normal hearing, non icteric sclera   Heart:  Regular rate and rhythm;  No lower extremity edema   Pulm: Normal respiratory effort   Abdomen:  Soft, nondistended, nontender.  Normal bowel sounds.          Neurologic:  Alert and  oriented x4;  grossly normal neurologically. Psych:  Pleasant, cooperative.  Normal mood and affect.   Intake/Output from previous day: 03/21 0701 - 03/22 0700 In: 636 [P.O.:480; I.V.:156] Out: 475 [Urine:475] Intake/Output this shift: Total I/O In: 0  Out: 375 [Urine:375]  Lab Results: Recent Labs    05/02/19 1407 05/02/19 2059 05/03/19 0541  WBC 17.4* 21.8* 26.6*  HGB 8.5* 7.7* 7.0*  HCT 25.5* 23.6* 21.2*  PLT 304 318 340   BMET Recent Labs    05/01/19 0553 05/02/19 0604 05/03/19 0541  NA 132* 130* 127*  K 4.0 4.4 5.1  CL 101 102 100  CO2 22 21* 21*  GLUCOSE 186* 166* 195*  BUN 21 38* 54*  CREATININE 1.06 0.99 1.29*  CALCIUM 8.6* 8.5* 8.1*  LFT Recent Labs    05/03/19 0541  PROT 4.8*  ALBUMIN 1.9*  AST 54*  ALT 79*  ALKPHOS 261*  BILITOT 1.1   PT/INR No results for input(s): LABPROT, INR in the last 72 hours. Hepatitis Panel No results for input(s): HEPBSAG, HCVAB, HEPAIGM, HEPBIGM in the last 72 hours.  CT ABDOMEN PELVIS W CONTRAST  Result Date: 05/02/2019 CLINICAL DATA:  86 year old male with history of nausea and vomiting. Suspected bowel obstruction or abdominal abscess/infection. EXAM: CT ABDOMEN AND PELVIS WITH CONTRAST TECHNIQUE: Multidetector CT imaging of the  abdomen and pelvis was performed using the standard protocol following bolus administration of intravenous contrast. CONTRAST:  179mL OMNIPAQUE IOHEXOL 300 MG/ML  SOLN COMPARISON:  CT the abdomen and pelvis 04/20/2019. FINDINGS: Lower chest: Trace bilateral pleural effusions lying dependently. Atherosclerotic calcifications in the descending thoracic aorta as well as the right coronary artery. Mild fibrotic changes in the lung bases bilaterally, suggestive of nonspecific interstitial pneumonia. Hepatobiliary: Ill-defined hypovascular areas in the liver, best demonstrated by a 1.6 x 1.5 cm lesion in segment 4B (axial image 21 of series 2), similar to the recent prior examination. No intra or extrahepatic biliary ductal dilatation. Gallbladder is nearly completely decompressed, but the gallbladder wall appears thickened with avid enhancement of the gallbladder mucosa and mild diffuse gallbladder wall edema and subtle surrounding inflammatory changes. Pancreas: No pancreatic mass. No pancreatic ductal dilatation. No pancreatic or peripancreatic fluid collections or inflammatory changes. Spleen: Unremarkable. Adrenals/Urinary Tract: Low-attenuation lesions in the right kidney, compatible with simple cysts, largest of which is an exophytic lesion in the upper pole measuring 4.8 cm. Other subcentimeter low-attenuation lesions in the kidneys bilaterally, too small to characterize, but statistically likely to represent tiny cysts. Bilateral adrenal glands are normal in appearance. No hydroureteronephrosis. Urinary bladder is normal in appearance. Stomach/Bowel: Normal appearance of the stomach. No pathologic dilatation of small bowel or colon. Numerous colonic diverticulae are noted, particularly in the descending colon and sigmoid colon, without surrounding inflammatory changes to suggest an acute diverticulitis at this time. Normal appendix. Vascular/Lymphatic: Aortic atherosclerosis. 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 No lymphadenopathy noted in the abdomen or pelvis. No lymphadenopathy noted in the abdomen or pelvis. Reproductive: Prostate gland and seminal vesicles are unremarkable in appearance. Other: No significant volume of ascites.  No pneumoperitoneum. Musculoskeletal: There are no aggressive appearing lytic or blastic lesions noted in the visualized portions of the skeleton. IMPRESSION: 1. Although the gallbladder is nearly decompressed, and there are no definite calcified gallstones, the gallbladder wall appears thickened and edematous with hyperenhancement and subtle surrounding inflammatory changes. Clinical correlation for signs and symptoms of a calculus cholecystitis is suggested. 2. Ill-defined hepatic lesions again noted, the possibility of metastatic disease should be considered. This could be further evaluated with nonemergent abdominal MRI with and without IV gadolinium if clinically appropriate. 3. Colonic diverticulosis without evidence of acute diverticulitis at this time. 4. 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 5. Trace bilateral pleural effusions lying dependently. 6. The appearance of the lung bases suggests interstitial lung disease such as nonspecific interstitial pneumonia (NSIP). Outpatient referral to Pulmonology should be considered in the near future to for further evaluation. 7. Additional incidental findings, as above. Electronically Signed   By: Vinnie Langton M.D.   On: 05/02/2019 18:44   DG Abd Portable 1V  Result Date: 05/01/2019 CLINICAL DATA:  Assess for constipation EXAM: PORTABLE ABDOMEN - 1 VIEW COMPARISON:  None. FINDINGS: A few mildly prominent loops of small bowel are seen in the left abdomen measuring up to 3 cm. Moderate fecal loading is seen throughout the colon. No other abnormalities are identified. IMPRESSION:  Mildly prominent loops of small bowel in the left abdomen could represent mild ileus or early obstruction in the appropriate clinical setting. Moderate fecal loading in the colon. Electronically Signed   By: Dorise Bullion III M.D   On: 05/01/2019 12:20    Principal Problem:   Acute pancreatitis Active Problems:   Pulmonary embolism ([place])   Lung mass   Essential hypertension   Elevated LFTs   Bile duct stone     LOS: 8 days   Dennis Stephens ,NP 05/03/2019, 12:06 PM  GI ATTENDING  Patient known to me for choledocholithiasis s/p ERCP with sphincterotomy and stone extraction last week. Has transient UGI bleed on anticoagulation therapy. Now stable. Suspect bleed from sphincterotomy site. Plan EGD with side viewing scope tomorrow. The patient is high risk given his comorbid conditions. The nature of the procedure, as well as the risks, benefits, and alternatives were carefully and thoroughly reviewed with the patient. Ample time for discussion and questions allowed. The patient understood, was satisfied, and agreed to proceed.   Docia Chuck. Geri Seminole., M.D. [facility] Division of Gastroenterology

## 2019-05-03 NOTE — Progress Notes (Signed)
67mins check after blood started patient stable no adverse event noted. Will continue to monitor.

## 2019-05-03 NOTE — Telephone Encounter (Signed)
Pt to be discharged with Hospice.  I told Dennis Stephens at [facility] that Julien Nordmann will not be attending. He has not seen pt.

## 2019-05-03 NOTE — Progress Notes (Signed)
Physical Therapy Treatment Patient Details Name: Dennis Stephens MRN: [id number] DOB: 1933-11-14 Today's Date: 05/03/2019    History of Present Illness Patient is a 86 year old male with history of hypertension, A. fib, recent diagnosis of PE, likely metastatic CA with spiculated pulmonary nodules presented with abdominal pain. Dx of acute pancreatitis.  Pt s/p ECRP on 04/27/19.     PT Comments    Patient limited today by fatigue and weakness. He declined to participate in OOB activity but was agreeable to sit EOB and perform exercises. Patient became dizzy after sitting EOB for ~6 minutes and BP found to be 104/53 with HTR of 78 bpm. After returning to supine pt's BP improved to 129/62 with HR of 73 bpm. Pt had c/o neck pain and was educated on exercises for deep cervical flexors to improve muscle strength to improve posture. Pt had difficulty achieving proper form in sitting and completed last 5 reps in supine with more success. He will continue to benefit from skilled PT interventions to address impairments and progress towards goals.    Follow Up Recommendations  Home health PT;Other (comment)(if he can with home hospice)     Equipment Recommendations  None recommended by PT    Recommendations for Other Services       Precautions / Restrictions Precautions Precautions: Fall Restrictions Weight Bearing Restrictions: No    Mobility  Bed Mobility Overal bed mobility: Needs Assistance Bed Mobility: Supine to Sit;Sit to Supine     Supine to sit: Supervision;HOB elevated Sit to supine: Min assist   General bed mobility comments: pt takes extra time and effort to move to EOB. HOB elevated. Pt asat EOB for ~ 6 minutes to complete cervical exercises as he c/o neck pain and weakness once sitting up. Pt BP dropped in sitting and he reported dizziness. Reruned to supine. Assist required to raise LE's into bed.  Transfers    General transfer comment: pt declined to  perform.  Ambulation/Gait      Stairs      Wheelchair Mobility    Modified Rankin (Stroke Patients Only)       Balance Overall balance assessment: Needs assistance Sitting-balance support: Feet supported;Bilateral upper extremity supported Sitting balance-Leahy Scale: Fair           Cognition Arousal/Alertness: Awake/alert Behavior During Therapy: WFL for tasks assessed/performed Overall Cognitive Status: Within Functional Limits for tasks assessed      General Comments: pt with depressed demeanor throughout session      Exercises Other Exercises Other Exercises: Chin tucks: pt required verbal/tactile cues to achieve proper form for chin tuck to strengthen cervical flexors. 5 reps peformed sitting EOB and 5 more reps once returned to supine.    General Comments        Pertinent Vitals/Pain Pain Assessment: No/denies pain           PT Goals (current goals can now be found in the care plan section) Acute Rehab PT Goals Patient Stated Goal: to go home  PT Goal Formulation: With patient Time For Goal Achievement: 05/15/19 Potential to Achieve Goals: Good Progress towards PT goals: Progressing toward goals    Frequency    Min 3X/week      PT Plan Current plan remains appropriate       AM-PAC PT "6 Clicks" Mobility   Outcome Measure  Help needed turning from your back to your side while in a flat bed without using bedrails?: A Little Help needed moving from lying on your back to  sitting on the side of a flat bed without using bedrails?: A Little Help needed moving to and from a bed to a chair (including a wheelchair)?: A Little Help needed standing up from a chair using your arms (e.g., wheelchair or bedside chair)?: A Little Help needed to walk in hospital room?: A Lot Help needed climbing 3-5 steps with a railing? : A Lot 6 Click Score: 16    End of Session Equipment Utilized During Treatment: Oxygen Activity Tolerance: Patient limited by  fatigue Patient left: in bed;with call bell/phone within reach;with bed alarm set;with family/visitor present Nurse Communication: Mobility status PT Visit Diagnosis: Muscle weakness (generalized) (M62.81);Difficulty in walking, not elsewhere classified (R26.2)     Time: 3435-6861 PT Time Calculation (min) (ACUTE ONLY): 24 min  Charges:  $Therapeutic Activity: 23-37 mins                     Verner Mould, DPT Physical Therapist with [facility] [phone]  05/03/2019 5:23 PM

## 2019-05-03 NOTE — Telephone Encounter (Signed)
VM for Heather to call me back re his attending at [facility].  Hospice is trying to get in touch with him to be attending.

## 2019-05-03 NOTE — Care Management Important Message (Signed)
Important Message  Patient Details IM Letter given to Roque Lias SW Case Manager to present to the Patient Name: Dennis Stephens MRN: [id number] Date of Birth: 04-Jan-1934   Medicare Important Message Given:  Yes     Kerin Salen 05/03/2019, 11:15 AM

## 2019-05-03 NOTE — H&P (View-Only) (Signed)
Progress Note    ASSESSMENT AND PLAN:   Brief history:  86 yo male with PMH significant for AFIB, HTN, CKD, DM, CAD and hyperlipidemia. He has a complicated recent medical history. Briefly, he was admitted on 3/10 with chest pain. Found to have bilateral PEs and lung mass. Started on Xarelto, he declined bronchoscopy in favor of following up at [facility]. He was readmitted on 3/14 with abdominal pain, we saw him in consult  for gallstone pancreatitis. He underwent ERCP ( Dr. Henrene Pastor) with sphincterotomy 3/16 with extraction of stones / purulent bile. . Abdominal pain and liver chemistries improved. We signed off case on 04/28/19.  Since then he did undergo bronchoscopy and likely has metastatic lung cancer.   A couple of days ago patient began bloody stools ( melena). He describes abdominal pain but has had same pancreatitis type pain since admission, not new pain associated with the bleeding. He has had some nausea / vomiting but emesis non-bloody / no coffee ground emesis. His hgb has continuously declined since onset of bleeding from mid 11 to 7.0 today. Last black stools / episode of bleeding was yesterday.   # Upper GI bleed on IV heparin.  --? Etiology?  Post-sphincterotomy bleed seems unlikely given interval time period between procedure and bleeding.  --Last episode of bleeding was yesterday --"black", FOBT+ stool, elevated BUN  --Heparin now on hold --Continue BID IV PPI --Transfused a unit of pRPBC earlier this am. Repeat CBC ordered --He is already NPO  --Will probably need EGD vr small bowel enteroscopy.   # biliary pancreatitis --s/p stone extraction / sphincterotomy this admission --Liver tests have continued to improve --Radiographic improvement. No findings of pancreatitis on CT scan  abd/ pelvis with contrast yesterday.   # PE --IV heparin on hold  # Goals of Care --Palliative / Hospice team evaluated. Patient doesn't want treatment for lung  cancer   SUBJECTIVE    Wife at bedside. No GI bleeding today. Still has intermittent RUQ pain/    OBJECTIVE:     Vital signs in last 24 hours: Temp:  [97.6 F (36.4 C)-98.5 F (36.9 C)] 97.6 F (36.4 C) (03/22 1003) Pulse Rate:  [66-76] 75 (03/22 1003) Resp:  [18-20] 20 (03/22 1003) BP: (101-145)/(41-87) 145/73 (03/22 1003) SpO2:  [95 %-100 %] 100 % (03/22 1003) Last BM Date: 05/02/19 General:   Alert, well-developed male in NAD EENT:  Normal hearing, non icteric sclera   Heart:  Regular rate and rhythm;  No lower extremity edema   Pulm: Normal respiratory effort   Abdomen:  Soft, nondistended, nontender.  Normal bowel sounds.          Neurologic:  Alert and  oriented x4;  grossly normal neurologically. Psych:  Pleasant, cooperative.  Normal mood and affect.   Intake/Output from previous day: 03/21 0701 - 03/22 0700 In: 636 [P.O.:480; I.V.:156] Out: 475 [Urine:475] Intake/Output this shift: Total I/O In: 0  Out: 375 [Urine:375]  Lab Results: Recent Labs    05/02/19 1407 05/02/19 2059 05/03/19 0541  WBC 17.4* 21.8* 26.6*  HGB 8.5* 7.7* 7.0*  HCT 25.5* 23.6* 21.2*  PLT 304 318 340   BMET Recent Labs    05/01/19 0553 05/02/19 0604 05/03/19 0541  NA 132* 130* 127*  K 4.0 4.4 5.1  CL 101 102 100  CO2 22 21* 21*  GLUCOSE 186* 166* 195*  BUN 21 38* 54*  CREATININE 1.06 0.99 1.29*  CALCIUM 8.6* 8.5* 8.1*  LFT Recent Labs    05/03/19 0541  PROT 4.8*  ALBUMIN 1.9*  AST 54*  ALT 79*  ALKPHOS 261*  BILITOT 1.1   PT/INR No results for input(s): LABPROT, INR in the last 72 hours. Hepatitis Panel No results for input(s): HEPBSAG, HCVAB, HEPAIGM, HEPBIGM in the last 72 hours.  CT ABDOMEN PELVIS W CONTRAST  Result Date: 05/02/2019 CLINICAL DATA:  86 year old male with history of nausea and vomiting. Suspected bowel obstruction or abdominal abscess/infection. EXAM: CT ABDOMEN AND PELVIS WITH CONTRAST TECHNIQUE: Multidetector CT imaging of the  abdomen and pelvis was performed using the standard protocol following bolus administration of intravenous contrast. CONTRAST:  19mL OMNIPAQUE IOHEXOL 300 MG/ML  SOLN COMPARISON:  CT the abdomen and pelvis 04/20/2019. FINDINGS: Lower chest: Trace bilateral pleural effusions lying dependently. Atherosclerotic calcifications in the descending thoracic aorta as well as the right coronary artery. Mild fibrotic changes in the lung bases bilaterally, suggestive of nonspecific interstitial pneumonia. Hepatobiliary: Ill-defined hypovascular areas in the liver, best demonstrated by a 1.6 x 1.5 cm lesion in segment 4B (axial image 21 of series 2), similar to the recent prior examination. No intra or extrahepatic biliary ductal dilatation. Gallbladder is nearly completely decompressed, but the gallbladder wall appears thickened with avid enhancement of the gallbladder mucosa and mild diffuse gallbladder wall edema and subtle surrounding inflammatory changes. Pancreas: No pancreatic mass. No pancreatic ductal dilatation. No pancreatic or peripancreatic fluid collections or inflammatory changes. Spleen: Unremarkable. Adrenals/Urinary Tract: Low-attenuation lesions in the right kidney, compatible with simple cysts, largest of which is an exophytic lesion in the upper pole measuring 4.8 cm. Other subcentimeter low-attenuation lesions in the kidneys bilaterally, too small to characterize, but statistically likely to represent tiny cysts. Bilateral adrenal glands are normal in appearance. No hydroureteronephrosis. Urinary bladder is normal in appearance. Stomach/Bowel: Normal appearance of the stomach. No pathologic dilatation of small bowel or colon. Numerous colonic diverticulae are noted, particularly in the descending colon and sigmoid colon, without surrounding inflammatory changes to suggest an acute diverticulitis at this time. Normal appendix. Vascular/Lymphatic: Aortic atherosclerosis. 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 No lymphadenopathy noted in the abdomen or pelvis. No lymphadenopathy noted in the abdomen or pelvis. Reproductive: Prostate gland and seminal vesicles are unremarkable in appearance. Other: No significant volume of ascites.  No pneumoperitoneum. Musculoskeletal: There are no aggressive appearing lytic or blastic lesions noted in the visualized portions of the skeleton. IMPRESSION: 1. Although the gallbladder is nearly decompressed, and there are no definite calcified gallstones, the gallbladder wall appears thickened and edematous with hyperenhancement and subtle surrounding inflammatory changes. Clinical correlation for signs and symptoms of a calculus cholecystitis is suggested. 2. Ill-defined hepatic lesions again noted, the possibility of metastatic disease should be considered. This could be further evaluated with nonemergent abdominal MRI with and without IV gadolinium if clinically appropriate. 3. Colonic diverticulosis without evidence of acute diverticulitis at this time. 4. 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 5. Trace bilateral pleural effusions lying dependently. 6. The appearance of the lung bases suggests interstitial lung disease such as nonspecific interstitial pneumonia (NSIP). Outpatient referral to Pulmonology should be considered in the near future to for further evaluation. 7. Additional incidental findings, as above. Electronically Signed   By: Vinnie Langton M.D.   On: 05/02/2019 18:44   DG Abd Portable 1V  Result Date: 05/01/2019 CLINICAL DATA:  Assess for constipation EXAM: PORTABLE ABDOMEN - 1 VIEW COMPARISON:  None. FINDINGS: A few mildly prominent loops of small bowel are seen in the left abdomen measuring up to 3 cm. Moderate fecal loading is seen throughout the colon. No other abnormalities are identified. IMPRESSION:  Mildly prominent loops of small bowel in the left abdomen could represent mild ileus or early obstruction in the appropriate clinical setting. Moderate fecal loading in the colon. Electronically Signed   By: Dorise Bullion III M.D   On: 05/01/2019 12:20    Principal Problem:   Acute pancreatitis Active Problems:   Pulmonary embolism ([place])   Lung mass   Essential hypertension   Elevated LFTs   Bile duct stone     LOS: 8 days   Tye Savoy ,NP 05/03/2019, 12:06 PM  GI ATTENDING  Patient known to me for choledocholithiasis s/p ERCP with sphincterotomy and stone extraction last week. Has transient UGI bleed on anticoagulation therapy. Now stable. Suspect bleed from sphincterotomy site. Plan EGD with side viewing scope tomorrow. The patient is high risk given his comorbid conditions. The nature of the procedure, as well as the risks, benefits, and alternatives were carefully and thoroughly reviewed with the patient. Ample time for discussion and questions allowed. The patient understood, was satisfied, and agreed to proceed.   Docia Chuck. Geri Seminole., M.D. [facility] Division of Gastroenterology

## 2019-05-03 NOTE — Progress Notes (Signed)
[facility])  Referral received for hospice services at home once discharged.  Met with pt and wife, explained services and offered support.  Plan is to d/c home once medically stable (currently being transfused with PRBCs).  Currently has no DME needs.  Will need:  Hospital bed, O2 @ 2 lpm, 3 n 1, walker and wheelchair.  [facility] will order.   Per wife, pt able to ambulate to bathroom with support and should be able to d/c home via personal vehicle.  Please send completed DNR and arrange for any comfort scripts that may be needed for symptom management so there is no lapse in pt comfort prior to hospice services beginning.  Venia Carbon RN, BSN, [facility] Liaison (in [place]) [phone]

## 2019-05-03 NOTE — Progress Notes (Signed)
1units blood transfused without adverse events. Patient A/O/Vx4. Will continue monitor.

## 2019-05-04 ENCOUNTER — Encounter (HOSPITAL_COMMUNITY): Payer: Self-pay | Admitting: Internal Medicine

## 2019-05-04 ENCOUNTER — Inpatient Hospital Stay (HOSPITAL_COMMUNITY): Payer: No Typology Code available for payment source | Admitting: Anesthesiology

## 2019-05-04 ENCOUNTER — Encounter (HOSPITAL_COMMUNITY)
Admission: EM | Disposition: A | Discharge status: Hospice | Payer: Self-pay | Source: Home / Self Care | Attending: Internal Medicine

## 2019-05-04 DIAGNOSIS — K264 Chronic or unspecified duodenal ulcer with hemorrhage: Secondary | ICD-10-CM

## 2019-05-04 HISTORY — PX: ESOPHAGOGASTRODUODENOSCOPY (EGD) WITH PROPOFOL: SHX5813

## 2019-05-04 LAB — TYPE AND SCREEN
ABO/RH(D): O POS
Antibody Screen: NEGATIVE
Unit division: 0
Unit division: 0

## 2019-05-04 LAB — CBC
HCT: 22.7 % — ABNORMAL LOW (ref 39.0–52.0)
Hemoglobin: 7.5 g/dL — ABNORMAL LOW (ref 13.0–17.0)
MCH: 31.1 pg (ref 26.0–34.0)
MCHC: 33 g/dL (ref 30.0–36.0)
MCV: 94.2 fL (ref 80.0–100.0)
Platelets: 256 10*3/uL (ref 150–400)
RBC: 2.41 MIL/uL — ABNORMAL LOW (ref 4.22–5.81)
RDW: 14.9 % (ref 11.5–15.5)
WBC: 14.2 10*3/uL — ABNORMAL HIGH (ref 4.0–10.5)
nRBC: 0 % (ref 0.0–0.2)

## 2019-05-04 LAB — COMPREHENSIVE METABOLIC PANEL
ALT: 65 U/L — ABNORMAL HIGH (ref 0–44)
AST: 48 U/L — ABNORMAL HIGH (ref 15–41)
Albumin: 2 g/dL — ABNORMAL LOW (ref 3.5–5.0)
Alkaline Phosphatase: 198 U/L — ABNORMAL HIGH (ref 38–126)
Anion gap: 7 (ref 5–15)
BUN: 45 mg/dL — ABNORMAL HIGH (ref 8–23)
CO2: 21 mmol/L — ABNORMAL LOW (ref 22–32)
Calcium: 7.9 mg/dL — ABNORMAL LOW (ref 8.9–10.3)
Chloride: 103 mmol/L (ref 98–111)
Creatinine, Ser: 0.98 mg/dL (ref 0.61–1.24)
GFR calc Af Amer: >60 mL/min (ref >60)
GFR calc non Af Amer: >60 mL/min (ref >60)
Glucose, Bld: 144 mg/dL — ABNORMAL HIGH (ref 70–99)
Potassium: 4.4 mmol/L (ref 3.5–5.1)
Sodium: 131 mmol/L — ABNORMAL LOW (ref 135–145)
Total Bilirubin: 1 mg/dL (ref 0.3–1.2)
Total Protein: 4.7 g/dL — ABNORMAL LOW (ref 6.5–8.1)

## 2019-05-04 LAB — CBC WITH DIFFERENTIAL/PLATELET
Abs Immature Granulocytes: 1.43 10*3/uL — ABNORMAL HIGH (ref 0.00–0.07)
Basophils Absolute: 0.1 10*3/uL (ref 0.0–0.1)
Basophils Relative: 1 %
Eosinophils Absolute: 0.4 10*3/uL (ref 0.0–0.5)
Eosinophils Relative: 2 %
HCT: 22.6 % — ABNORMAL LOW (ref 39.0–52.0)
Hemoglobin: 7.6 g/dL — ABNORMAL LOW (ref 13.0–17.0)
Immature Granulocytes: 7 %
Lymphocytes Relative: 11 %
Lymphs Abs: 2.4 10*3/uL (ref 0.7–4.0)
MCH: 31.3 pg (ref 26.0–34.0)
MCHC: 33.6 g/dL (ref 30.0–36.0)
MCV: 93 fL (ref 80.0–100.0)
Monocytes Absolute: 1.9 10*3/uL — ABNORMAL HIGH (ref 0.1–1.0)
Monocytes Relative: 9 %
Neutro Abs: 15.5 10*3/uL — ABNORMAL HIGH (ref 1.7–7.7)
Neutrophils Relative %: 70 %
Platelets: 257 10*3/uL (ref 150–400)
RBC: 2.43 MIL/uL — ABNORMAL LOW (ref 4.22–5.81)
RDW: 14.6 % (ref 11.5–15.5)
WBC: 21.7 10*3/uL — ABNORMAL HIGH (ref 4.0–10.5)
nRBC: 0 % (ref 0.0–0.2)

## 2019-05-04 LAB — HEPARIN LEVEL (UNFRACTIONATED): Heparin Unfractionated: <0.1 IU/mL — ABNORMAL LOW (ref 0.30–0.70)

## 2019-05-04 LAB — BPAM RBC
Blood Product Expiration Date: 202104212359
Blood Product Expiration Date: 202104222359
ISSUE DATE / TIME: 202103220856
ISSUE DATE / TIME: 202103221633
Unit Type and Rh: 5100
Unit Type and Rh: 5100

## 2019-05-04 LAB — GLUCOSE, CAPILLARY
Glucose-Capillary: 129 mg/dL — ABNORMAL HIGH (ref 70–99)
Glucose-Capillary: 138 mg/dL — ABNORMAL HIGH (ref 70–99)
Glucose-Capillary: 140 mg/dL — ABNORMAL HIGH (ref 70–99)
Glucose-Capillary: 151 mg/dL — ABNORMAL HIGH (ref 70–99)
Glucose-Capillary: 163 mg/dL — ABNORMAL HIGH (ref 70–99)
Glucose-Capillary: 164 mg/dL — ABNORMAL HIGH (ref 70–99)

## 2019-05-04 LAB — CYTOLOGY - NON PAP

## 2019-05-04 SURGERY — ESOPHAGOGASTRODUODENOSCOPY (EGD) WITH PROPOFOL
Anesthesia: Monitor Anesthesia Care

## 2019-05-04 MED ORDER — PROPOFOL 10 MG/ML IV BOLUS
INTRAVENOUS | Status: DC | PRN
  Administered 2019-05-04: 10 mg via INTRAVENOUS
  Administered 2019-05-04: 30 mg via INTRAVENOUS
  Administered 2019-05-04: 20 mg via INTRAVENOUS

## 2019-05-04 MED ORDER — HEPARIN (PORCINE) 25000 UT/250ML-% IV SOLN
1300.0000 [IU]/h | INTRAVENOUS | Status: DC
  Administered 2019-05-04: 19:00:00 1300 [IU]/h via INTRAVENOUS
  Filled 2019-05-04 (×2): qty 250

## 2019-05-04 MED ORDER — LACTATED RINGERS IV SOLN: INTRAVENOUS | Status: DC | PRN

## 2019-05-04 MED ORDER — PROPOFOL 500 MG/50ML IV EMUL
INTRAVENOUS | Status: DC | PRN
  Administered 2019-05-04: 75 ug/kg/min via INTRAVENOUS

## 2019-05-04 SURGICAL SUPPLY — 15 items

## 2019-05-04 NOTE — Addendum Note (Signed)
Addendum  created 05/04/19 1537 by Deliah Boston, CRNA   Flowsheet accepted, Intraprocedure Flowsheets edited

## 2019-05-04 NOTE — Anesthesia Postprocedure Evaluation (Signed)
Anesthesia Post Note  Patient: Dennis Stephens  Procedure(s) Performed: ESOPHAGOGASTRODUODENOSCOPY (EGD) WITH PROPOFOL (N/A )     Patient location during evaluation: PACU Anesthesia Type: MAC Level of consciousness: awake and alert Pain management: pain level controlled Vital Signs Assessment: post-procedure vital signs reviewed and stable Respiratory status: spontaneous breathing, nonlabored ventilation and respiratory function stable Cardiovascular status: blood pressure returned to baseline and stable Postop Assessment: no apparent nausea or vomiting Anesthetic complications: no    Last Vitals:  Vitals:   05/04/19 1440 05/04/19 1457  BP:  (!) 156/53  Pulse: 67 73  Resp: (!) 21   Temp:  36.9 C  SpO2: 98% 97%    Last Pain:  Vitals:   05/04/19 1457  TempSrc: Oral  PainSc: 0-No pain                 Pervis Hocking

## 2019-05-04 NOTE — Progress Notes (Signed)
Patient NPO for procedure A.M po meds not administered. MD aware.

## 2019-05-04 NOTE — Anesthesia Procedure Notes (Signed)
Procedure Name: MAC Date/Time: 05/04/2019 2:00 PM Performed by: Deliah Boston, CRNA Pre-anesthesia Checklist: Patient identified, Emergency Drugs available, Suction available and Patient being monitored Patient Re-evaluated:Patient Re-evaluated prior to induction Oxygen Delivery Method: Simple face mask Preoxygenation: Pre-oxygenation with 100% oxygen Placement Confirmation: positive ETCO2 and breath sounds checked- equal and bilateral

## 2019-05-04 NOTE — Op Note (Signed)
[facility] Patient Name: Dennis Stephens Procedure Date: 05/04/2019 MRN: [id number] Attending MD: Docia Chuck. Henrene Pastor , MD Date of Birth: [birth year] CSN: [id number] Age: 86 Admit Type: Inpatient Procedure:                Upper GI endoscopy Indications:              Melena. 1 week post ERCP with sphincterotomy. Had                            been on Eliquis. Heparin stopped yesterday. Providers:                Docia Chuck. Henrene Pastor, MD, Carmie End, RN, William Dalton, Technician Referring MD:             [facility] Medicines:                Monitored Anesthesia Care Complications:            No immediate complications. Estimated Blood Loss:     Estimated blood loss: none. Procedure:                Pre-Anesthesia Assessment:                           - Prior to the procedure, a History and Physical                            was performed, and patient medications and                            allergies were reviewed. The patient's tolerance of                            previous anesthesia was also reviewed. The risks                            and benefits of the procedure and the sedation                            options and risks were discussed with the patient.                            All questions were answered, and informed consent                            was obtained. Prior Anticoagulants: The patient has                            taken heparin, last dose was 1 day prior to                            procedure. ASA Grade Assessment: III - A patient  with severe systemic disease. After reviewing the                            risks and benefits, the patient was deemed in                            satisfactory condition to undergo the procedure.                           After obtaining informed consent, the endoscope was                            passed under direct vision. Throughout the                  procedure, the patient's blood pressure, pulse, and                            oxygen saturations were monitored continuously. The                            Duodenoscope was introduced through the mouth, and                            advanced to the second part of duodenum. The upper                            GI endoscopy was accomplished without difficulty.                            The patient tolerated the procedure well. Scope In: Scope Out: Findings:      The was only examined at the gastroesophageal junction (side-viewing       scope) which was normal.      The stomach was normal.      The examined duodenum was normal. The prior biliary sphincterotomy site       revealed a clean-based ulcer. No active bleeding. No stigmata      The cardia and gastric fundus were normal on retroflexion. Impression:               1. Clean-based ulcer without bleeding or stigmata                            at prior sphincterotomy site                           2. Otherwise normal EGD. Moderate Sedation:      none Recommendation:           1. Continue twice daily PPI for 4 weeks then once                            daily thereafter                           2. Okay to resume anticoagulation  3. Regular diet                           4. Monitor for evidence of rebleeding                           I have reviewed this information with the patient.                            Provided him a report. We are available if needed.                            We will sign off.. Procedure Code(s):        --- Professional ---                           [phone], Esophagogastroduodenoscopy, flexible,                            transoral; diagnostic, including collection of                            specimen(s) by brushing or washing, when performed                            (separate procedure) Diagnosis Code(s):        --- Professional ---                            K92.1, Melena (includes Hematochezia) CPT copyright 2019 American Medical Association. All rights reserved. The codes documented in this report are preliminary and upon coder review may  be revised to meet current compliance requirements. Docia Chuck. Henrene Pastor, MD 05/04/2019 2:48:38 PM This report has been signed electronically. Number of Addenda: 0

## 2019-05-04 NOTE — Progress Notes (Signed)
ANTICOAGULATION CONSULT NOTE - Follow Up Consult  Pharmacy Consult for Heparin Indication: Recent PE, Xarelto on hold  No Known Allergies  Patient Measurements: Height: 5\' 9"  (175.3 cm) Weight: 205 lb 0.4 oz (93 kg) IBW/kg (Calculated) : 70.7 Heparin Dosing Weight: 90 kg  Vital Signs: Temp: 97.9 F (36.6 C) (03/23 1652) Temp Source: Oral (03/23 1652) BP: 159/52 (03/23 1652) Pulse Rate: 67 (03/23 1652)  Labs: Recent Labs    05/02/19 0604 05/02/19 1407 05/02/19 2059 05/02/19 2059 05/03/19 0541 05/03/19 0541 05/03/19 2224 05/04/19 0345  HGB 9.2*   < > 7.7*   < > 7.0*   < > 8.2* 7.6*  HCT 28.2*   < > 23.6*   < > 21.2*  --  24.3* 22.6*  PLT 320   < > 318  --  340  --   --  257  HEPARINUNFRC 0.52  --   --   --  0.55  --   --  <0.10*  CREATININE 0.99  --   --   --  1.29*  --   --  0.98   < > = values in this interval not displayed.    Estimated Creatinine Clearance: 64.3 mL/min (by C-G formula based on SCr of 0.98 mg/dL).   Medications:  Infusions:   Assessment: Pt is a 77 yoM with PMH significant for afib, recently diagnosed PE, recently diagnosed likely metastatic cancer with pulmonary nodules. Pt was recently admitted from 3/9 - 3/11 at which time he was diagnosed with bilateral PE and prescribed rivaroxaban. Rivaroxaban 15 mg PO BID initiated on 3/11, last dose was given at 0815 on 04/26/19. Anticoagulation was converted to heparin drip on 3/15 for planned procedures.   Significant Events:  3/16: ERCP  3/17: Bronch - note that after transbronchial biopsy there was significant bleeding from the left upper lobe  3/21: heparin held for episode of melena x 1; FOBT+, BUN elevated >> PRBC x 2 and PPI gtt started  3/23: upper GI endoscopy found no active bleeding; OK to resume anticoag per GI  Today, 05/04/2019:  Heparin level remains therapeutic at 0.55  CBC:  Hgb decreased to 7, Plt remain WNL.  See above for GI workup; no new bleeding per RN; per TRH to resume  heparin today w/o bolus  SCr back to baseline  Goal of Therapy:  Heparin level 0.3-0.7 units/ml Monitor platelets by anticoagulation protocol: Yes   Plan:   Resume heparin IV infusion at 1300 units/hr, no bolus as requested  Check heparin level 8 hrs after start  Daily heparin level and CBC  Close monitoring for any s/s bleeding  Follow up plans for transition back to Xarelto and discharge with home hospice.  Reuel Boom, PharmD, BCPS [phone] 05/04/2019, 6:14 PM

## 2019-05-04 NOTE — Progress Notes (Signed)
PROGRESS NOTE  Dennis Stephens LEX:[id number] DOB: Jul 17, 1933 DOA: 04/25/2019 PCP: [facility]  Brief summary: Patient is a 86 year old male with history of hypertension, A. fib, recent diagnosis of PE, likely metastatic CA with spiculated pulmonary nodules presented with abdominal pain. Patient reported pain 10/10 in upper quadrants, nausea and vomiting.  Patient also reported abdominal swelling, yellowing of his eyes and under the tongue.  Patient was recently admitted (04/20/2019-04/22/2019) for chest pain and was found to have PE for which he was started on Xarelto.  Patient was also found to have hilar and mediastinal lymphadenopathy with possible liver mets, spiculated pulmonary nodules during previous admission, he was scheduled for EBUS and bronchoscopy, declined further work-up inpatient and was discharged at his request.  He is requesting evaluation for hospice and palliative care. In ED, patient was found to have elevated lipase, 2207, elevated AST ALT alk phos 569 with leukocytosis. Abdominal ultrasound showed normal gallbladder, biliary duct dilatation with choledocholithiasis, recommended ERCP. Patient admitted for further management.     Today, saw patient before EGD, still with generalized abdominal discomfort, denies any other new complaints.    Assessment/Plan: Principal Problem:   Acute pancreatitis Active Problems:   Pulmonary embolism (HCC)   Lung mass   Essential hypertension   Elevated LFTs   Bile duct stone   Duodenal ulcer with hemorrhage   Acute pancreatitis likely 2/2 choledocholithiasis, acute transaminitis Currently afebrile, with leukocytosis Lipase trending down, 1879-->206 LFTs slowly trending down BC x2 NGTD GI on board, s/p ERCP with sphincterotomy, with CBD stone extraction on 3/16 by GI Dr. Henrene Pastor, GI signed off Completed 7 days of IV Zosyn Daily CMP  Likely upper GI bleed Reported one episode of dark tarry stool on 05/02/2019, FOBT  positive, BUN elevated Was on IV Heparin for PE Hemoglobin down to 7, from around 11-12 Transfuse 2 units of PRBC on 05/03/2019, Protonix twice daily GI consulted, EGD on 3/23 showed clean based ulcer without bleeding or stigmata at prior sphincterotomy D/C IV fluids Restart heparin without loading dose Protonix twice daily Daily CBC  Acute blood loss anemia Likely due to above Management as above  Pulmonary embolism Likely due to possible underlying malignancy Continue IV heparin, plan to switch to NOAC if hgb stabilizes by 05/05/19  Hilar and mediastinal lymphadenopathy, spiculated pulmonary nodules Seen on CT during previous admission, pulmonology was consulted S/P EBUS on 3/17, by PCCM, noted some intraoperative hemorrhage during procedure, 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  Further immunohistochemical stains currently pending Oncology consulted, plan for outpatient follow-up appointment  Acute hypoxic respiratory failure Likely multifactorial, due to above (PE, possible lung CA) Supplemental O2 prn Duo nebs, incentive spirometry, cough suppressant  Hyponatremia Improving Likely due to poor p.o. intake, possible SIADH in the setting of cancer D/C IVF Daily CMP  AKI on CKD stage II Resolved Ongoing likely to GI bleed Creatinine peaked at 1.55, baseline around 1.1 Hold losartan Daily CMP  Essential hypertension BP soft Hold Coreg, losartan, norvasc, restart as BP permits  Diabetes mellitus type 2, NIDDM Hold Amaryl, continue sliding scale insulin  Tremor Continue primidone, hold if any worsening effects  Obesity Lifestyle modification advised  [place] Palliative/hospice team consulted as patient stated he would not proceed with any treatment for his malignancy regardless of what the final biopsy issues. Hospice team on board, plan for home hospice once stable/work-up     Body mass  index is 30.28 kg/m.  Code Status: DNR DVT Prophylaxis:  SCDs   Family Communication: Discussed extensively with patient, spouse at bedside on 05/03/2019  Disposition Plan:    Patient came from:         home                                                                                                 Anticipated d/c place:  Likely home hospice  Barriers to d/c OR conditions which need to be met to effect a safe d/c: Plan for d/c to home, once hgb stabilizes and pt switches to PO NOAC   Consultants:  Pulmonary critical care  GI  Palliative care  Procedures:  ERCP on March 16  EBUS on 3/17  EGD on 05/04/19  Antibiotics:  Completed Zosyn   Objective: BP (!) 159/52 (BP Location: Left Arm)   Pulse 67   Temp 97.9 F (36.6 C) (Oral)   Resp 19   Ht '5\' 9"'  (1.753 m)   Wt 93 kg   SpO2 97%   BMI 30.28 kg/m   Intake/Output Summary (Last 24 hours) at 05/04/2019 1718 Last data filed at 05/04/2019 1500 Gross per 24 hour  Intake 1039.48 ml  Output 1750 ml  Net -710.52 ml   Filed Weights   04/27/19 0842 04/28/19 1331 05/04/19 1327  Weight: 96 kg 93 kg 93 kg    Exam:  General: NAD, acutely ill appearing   Cardiovascular: S1, S2 present  Respiratory:  Diminished breath sounds bilaterally  Abdomen: Soft, +tender, nondistended, bowel sounds present  Musculoskeletal: No bilateral pedal edema noted  Skin: Normal  Psychiatry: Normal mood    Data Reviewed: Basic Metabolic Panel: Recent Labs  Lab 04/30/19 0529 05/01/19 0553 05/02/19 0604 05/03/19 0541 05/04/19 0345  NA 133* 132* 130* 127* 131*  K 4.2 4.0 4.4 5.1 4.4  CL 104 101 102 100 103  CO2 21* 22 21* 21* 21*  GLUCOSE 119* 186* 166* 195* 144*  BUN 18 21 38* 54* 45*  CREATININE 1.01 1.06 0.99 1.29* 0.98  CALCIUM 8.4* 8.6* 8.5* 8.1* 7.9*   Liver Function Tests: Recent Labs  Lab 04/30/19 0529 05/01/19 0553 05/02/19 0604 05/03/19 0541 05/04/19 0345  AST 95* 79* 63* 54* 48*  ALT  131* 118* 92* 79* 65*  ALKPHOS 436* 404* 307* 261* 198*  BILITOT 1.9* 1.7* 1.1 1.1 1.0  PROT 5.7* 6.1* 5.0* 4.8* 4.7*  ALBUMIN 2.1* 2.3* 2.1* 1.9* 2.0*   Recent Labs  Lab 04/28/19 0109 05/02/19 0604  LIPASE 206* 95*   No results for input(s): AMMONIA in the last 168 hours. CBC: Recent Labs  Lab 05/01/19 0553 05/01/19 0553 05/02/19 0604 05/02/19 0604 05/02/19 1407 05/02/19 2059 05/03/19 0541 05/03/19 2224 05/04/19 0345  WBC 10.6*   < > 17.0*  --  17.4* 21.8* 26.6*  --  21.7*  NEUTROABS 7.6  --  12.5*  --  12.7*  --  20.6*  --  15.5*  HGB 11.4*   < > 9.2*   < > 8.5* 7.7* 7.0* 8.2* 7.6*  HCT 34.0*   < > 28.2*   < >  25.5* 23.6* 21.2* 24.3* 22.6*  MCV 92.1   < > 94.9  --  94.4 96.3 95.1  --  93.0  PLT 332   < > 320  --  304 318 340  --  257   < > = values in this interval not displayed.   Cardiac Enzymes:   No results for input(s): CKTOTAL, CKMB, CKMBINDEX, TROPONINI in the last 168 hours. BNP (last 3 results) Recent Labs    04/25/19 1730  BNP 128.3*    ProBNP (last 3 results) No results for input(s): PROBNP in the last 8760 hours.  CBG: Recent Labs  Lab 05/03/19 2357 05/04/19 0415 05/04/19 0743 05/04/19 1132 05/04/19 1605  GLUCAP 187* 140* 151* 138* 164*    Recent Results (from the past 240 hour(s))  SARS CORONAVIRUS 2 (TAT 6-24 HRS) Nasopharyngeal Nasopharyngeal Swab     Status: None   Collection Time: 04/25/19  7:59 PM   Specimen: Nasopharyngeal Swab  Result Value Ref Range Status   SARS Coronavirus 2 NEGATIVE NEGATIVE Final    Comment: (NOTE) SARS-CoV-2 target nucleic acids are NOT DETECTED. The SARS-CoV-2 RNA is generally detectable in upper and lower respiratory specimens during the acute phase of infection. Negative results do not preclude SARS-CoV-2 infection, do not rule out co-infections with other pathogens, and should not be used as the sole basis for treatment or other patient management decisions. Negative results must be combined with  clinical observations, patient history, and epidemiological information. The expected result is Negative. Fact Sheet for Patients: SugarRoll.be Fact Sheet for Healthcare Providers: https://www.woods-mathews.com/ This test is not yet approved or cleared by the [place] FDA and  has been authorized for detection and/or diagnosis of SARS-CoV-2 by FDA under an Emergency Use Authorization (EUA). This EUA will remain  in effect (meaning this test can be used) for the duration of the COVID-19 declaration under Section 56 4(b)(1) of the Act, 21 U.S.C. section 360bbb-3(b)(1), unless the authorization is terminated or revoked sooner. Performed at [facility], [place] [address] [place], [place] [address]   Culture, blood (Routine X 2) w Reflex to ID Panel     Status: None   Collection Time: 04/26/19  1:02 PM   Specimen: BLOOD  Result Value Ref Range Status   Specimen Description   Final    BLOOD RIGHT ANTECUBITAL Performed at [place] [address] [place], [place] [address]    Special Requests   Final    BOTTLES DRAWN AEROBIC AND ANAEROBIC Blood Culture adequate volume Performed at [place] [address] [place], [place] [address]    Culture   Final    NO GROWTH 5 DAYS Performed at [facility], [place] [address] [place] [address] [place], [place] [address]    Report Status 05/01/2019 FINAL  Final  Culture, blood (Routine X 2) w Reflex to ID Panel     Status: Abnormal   Collection Time: 04/26/19  1:02 PM   Specimen: BLOOD RIGHT HAND  Result Value Ref Range Status   Specimen Description   Final    BLOOD RIGHT HAND Performed at [place] [address] [place], [place] [address]    Special Requests   Final    AEROBIC BOTTLE ONLY Blood Culture results may not be optimal due to an inadequate volume of blood received in culture bottles   Culture  Setup Time   Final    AEROBIC  BOTTLE ONLY GRAM POSITIVE COCCI IN CLUSTERS  CRITICAL RESULT CALLED TO, READ BACK BY AND VERIFIED WITH: L POINDEXTER PHARMD 04/27/19 1810 JDW    Culture (A)  Final    STAPHYLOCOCCUS SPECIES (COAGULASE NEGATIVE) THE SIGNIFICANCE OF ISOLATING THIS ORGANISM FROM A SINGLE SET OF BLOOD CULTURES WHEN MULTIPLE SETS ARE DRAWN IS UNCERTAIN. PLEASE NOTIFY THE MICROBIOLOGY DEPARTMENT WITHIN ONE WEEK IF SPECIATION AND SENSITIVITIES ARE REQUIRED. Performed at [facility], [place] [address], [place], [place] [address]    Report Status 04/29/2019 FINAL  Final     Studies: No results found.   Alma Friendly MD [facility]    05/04/2019, 5:18 PM  LOS: 9 days

## 2019-05-04 NOTE — Anesthesia Preprocedure Evaluation (Signed)
Anesthesia Evaluation    Reviewed: Allergy & Precautions, Patient's Chart, lab work & pertinent test results, reviewed documented beta blocker date and time   Airway Mallampati: II  TM Distance: >3 FB     Dental   Pulmonary former smoker, PE recent diagnosis of PE, likely metastatic CA with spiculated pulmonary nodules  PE- xarelto   breath sounds clear to auscultation       Cardiovascular hypertension, Pt. on home beta blockers and Pt. on medications + dysrhythmias Atrial Fibrillation  Rhythm:Regular Rate:Normal  HLD  AF on BB, xarelto    Neuro/Psych negative neurological ROS  negative psych ROS   GI/Hepatic GERD  Medicated and Controlled,Likely liver mets, choledocholithiasis for which he had recent ERCP  jaundice GIB s/p sphincterotomy    Endo/Other  diabetes, Type 2  Renal/GU Renal disease  negative genitourinary   Musculoskeletal negative musculoskeletal ROS (+)   Abdominal   Peds  Hematology  (+) Blood dyscrasia, anemia , H/H 7.6/22.6- getting 2 units pRBCs today   Anesthesia Other Findings recently admitted (04/20/2019-04/22/2019)for chest pain and was found to have PE for which he was started on Xarelto. Patient was also found to have hilar and mediastinal lymphadenopathy with possible liver mets, spiculated pulmonary nodules during previous admission, he was scheduled for EBUS and bronchoscopy, but declined further work-up inpatient and was discharged at his request. He is requesting evaluation forhospice and palliative care.   Readmitted 3/17, found to have elevated lipase, 2207, elevated AST ALT alk phos 569 with leukocytosis. Abdominal ultrasound showed normal gallbladder, biliary duct dilatation with choledocholithiasis, for which he underwent ERCP. Subsequent GIB s/p ERCP  Reproductive/Obstetrics negative OB ROS                             Anesthesia Physical Anesthesia  Plan  ASA: IV  Anesthesia Plan: MAC   Post-op Pain Management:    Induction:   PONV Risk Score and Plan: 2 and Propofol infusion and TIVA  Airway Management Planned: Natural Airway and Simple Face Mask  Additional Equipment: None  Intra-op Plan:   Post-operative Plan:   Informed Consent: I have reviewed the patients History and Physical, chart, labs and discussed the procedure including the risks, benefits and alternatives for the proposed anesthesia with the patient or authorized representative who has indicated his/her understanding and acceptance.       Plan Discussed with: CRNA  Anesthesia Plan Comments:         Anesthesia Quick Evaluation

## 2019-05-04 NOTE — Interval H&P Note (Signed)
History and Physical Interval Note:  05/04/2019 1:44 PM  Dennis Stephens  has presented today for surgery, with the diagnosis of gi bleed following ercp, sphincterotomy.  The various methods of treatment have been discussed with the patient and family. After consideration of risks, benefits and other options for treatment, the patient has consented to  Procedure(s): ESOPHAGOGASTRODUODENOSCOPY (EGD) WITH PROPOFOL (N/A) as a surgical intervention.  The patient's history has been reviewed, patient examined, no change in status, stable for surgery.  I have reviewed the patient's chart and labs.  Questions were answered to the patient's satisfaction.     Scarlette Shorts

## 2019-05-04 NOTE — Addendum Note (Signed)
Addendum  created 05/04/19 0803 by Belinda Block, MD   Intraprocedure Staff edited

## 2019-05-04 NOTE — Transfer of Care (Signed)
Immediate Anesthesia Transfer of Care Note  Patient: Dennis Stephens  Procedure(s) Performed: Procedure(s): ESOPHAGOGASTRODUODENOSCOPY (EGD) WITH PROPOFOL (N/A)  Patient Location: PACU  Anesthesia Type:mac  Level of Consciousness: Patient easily awoken, sedated, comfortable, cooperative, following commands, responds to stimulation.   Airway & Oxygen Therapy: Patient spontaneously breathing, ventilating well, oxygen via simple oxygen mask.  Post-op Assessment: Report given to PACU RN, vital signs reviewed and stable, moving all extremities.   Post vital signs: Reviewed and stable.  Complications: No apparent anesthesia complications Last Vitals:  Vitals Value Taken Time  BP 106/40 05/04/19 1430  Temp 36.8 C 05/04/19 1427  Pulse 72 05/04/19 1429  Resp 17 05/04/19 1431  SpO2 99 % 05/04/19 1429  Vitals shown include unvalidated device data.  Last Pain:  Vitals:   05/04/19 1427  TempSrc: Axillary  PainSc:       Patients Stated Pain Goal: 3 (03/00/92 3300)  Complications: No apparent anesthesia complications

## 2019-05-05 LAB — GLUCOSE, CAPILLARY
Glucose-Capillary: 127 mg/dL — ABNORMAL HIGH (ref 70–99)
Glucose-Capillary: 128 mg/dL — ABNORMAL HIGH (ref 70–99)
Glucose-Capillary: 144 mg/dL — ABNORMAL HIGH (ref 70–99)
Glucose-Capillary: 151 mg/dL — ABNORMAL HIGH (ref 70–99)
Glucose-Capillary: 159 mg/dL — ABNORMAL HIGH (ref 70–99)
Glucose-Capillary: 161 mg/dL — ABNORMAL HIGH (ref 70–99)

## 2019-05-05 LAB — HEPARIN LEVEL (UNFRACTIONATED): Heparin Unfractionated: 0.5 IU/mL (ref 0.30–0.70)

## 2019-05-05 LAB — CBC WITH DIFFERENTIAL/PLATELET
Abs Immature Granulocytes: 1.04 10*3/uL — ABNORMAL HIGH (ref 0.00–0.07)
Basophils Absolute: 0.1 10*3/uL (ref 0.0–0.1)
Basophils Relative: 1 %
Eosinophils Absolute: 0.3 10*3/uL (ref 0.0–0.5)
Eosinophils Relative: 2 %
HCT: 22.4 % — ABNORMAL LOW (ref 39.0–52.0)
Hemoglobin: 7.5 g/dL — ABNORMAL LOW (ref 13.0–17.0)
Immature Granulocytes: 8 %
Lymphocytes Relative: 18 %
Lymphs Abs: 2.3 10*3/uL (ref 0.7–4.0)
MCH: 31.5 pg (ref 26.0–34.0)
MCHC: 33.5 g/dL (ref 30.0–36.0)
MCV: 94.1 fL (ref 80.0–100.0)
Monocytes Absolute: 1.3 10*3/uL — ABNORMAL HIGH (ref 0.1–1.0)
Monocytes Relative: 10 %
Neutro Abs: 7.9 10*3/uL — ABNORMAL HIGH (ref 1.7–7.7)
Neutrophils Relative %: 61 %
Platelets: 240 10*3/uL (ref 150–400)
RBC: 2.38 MIL/uL — ABNORMAL LOW (ref 4.22–5.81)
RDW: 14.8 % (ref 11.5–15.5)
WBC: 12.9 10*3/uL — ABNORMAL HIGH (ref 4.0–10.5)
nRBC: 0 % (ref 0.0–0.2)

## 2019-05-05 LAB — COMPREHENSIVE METABOLIC PANEL
ALT: 71 U/L — ABNORMAL HIGH (ref 0–44)
AST: 61 U/L — ABNORMAL HIGH (ref 15–41)
Albumin: 2.1 g/dL — ABNORMAL LOW (ref 3.5–5.0)
Alkaline Phosphatase: 216 U/L — ABNORMAL HIGH (ref 38–126)
Anion gap: 5 (ref 5–15)
BUN: 22 mg/dL (ref 8–23)
CO2: 21 mmol/L — ABNORMAL LOW (ref 22–32)
Calcium: 7.8 mg/dL — ABNORMAL LOW (ref 8.9–10.3)
Chloride: 105 mmol/L (ref 98–111)
Creatinine, Ser: 0.93 mg/dL (ref 0.61–1.24)
GFR calc Af Amer: >60 mL/min (ref >60)
GFR calc non Af Amer: >60 mL/min (ref >60)
Glucose, Bld: 133 mg/dL — ABNORMAL HIGH (ref 70–99)
Potassium: 4.3 mmol/L (ref 3.5–5.1)
Sodium: 131 mmol/L — ABNORMAL LOW (ref 135–145)
Total Bilirubin: 1 mg/dL (ref 0.3–1.2)
Total Protein: 4.8 g/dL — ABNORMAL LOW (ref 6.5–8.1)

## 2019-05-05 MED ORDER — PANTOPRAZOLE SODIUM 40 MG PO TBEC
40.0000 mg | DELAYED_RELEASE_TABLET | Freq: Two times a day (BID) | ORAL | Status: DC
  Administered 2019-05-05 – 2019-05-06 (×2): 40 mg via ORAL
  Filled 2019-05-05 (×2): qty 1

## 2019-05-05 MED ORDER — APIXABAN 5 MG PO TABS
5.0000 mg | ORAL_TABLET | Freq: Two times a day (BID) | ORAL | Status: DC
  Administered 2019-05-05 – 2019-05-06 (×3): 5 mg via ORAL
  Filled 2019-05-05 (×3): qty 1

## 2019-05-05 MED ORDER — ALBUTEROL SULFATE (2.5 MG/3ML) 0.083% IN NEBU
2.5000 mg | INHALATION_SOLUTION | Freq: Three times a day (TID) | RESPIRATORY_TRACT | Status: DC
  Filled 2019-05-05: qty 3

## 2019-05-05 NOTE — Progress Notes (Signed)
OT Cancellation Note  Patient Details Name: Dennis Stephens MRN: [id number] DOB: 01/26/1934   Cancelled Treatment:    Reason Eval/Treat Not Completed: Other (comment) Spoke with pt who is planning to discharge home with hospice. Pt reports he does not wish to continue therapy services and would like to spend his better days with his loved ones. Educated pt and wife to notify staff should pt decide he would like to continue working with therapy. OT will sign off at this time. Please re-consult should pt decide he wants to participate with therapy.   [facility] OTR/L Acute Rehabilitation Services Office: [place]  05/05/2019, 12:53 PM

## 2019-05-05 NOTE — Progress Notes (Signed)
PT Cancellation Note  Patient Details Name: Dennis Stephens MRN: [id number] DOB: Apr 15, 1933   Cancelled Treatment:    Reason Eval/Treat Not Completed: Patient declined, no reason specified(Spoke with OT, pt requesting to hold all therapy services at this time. He is planning to go home with hospice and does not want to participate in therapy any further.) Will discontinue therapy services at this time. Please re-consult if there is a change in pt's wishes.   Verner Mould, DPT Physical Therapist with [facility] [phone]  05/05/2019 11:56 AM

## 2019-05-05 NOTE — Progress Notes (Signed)
ANTICOAGULATION CONSULT NOTE - Follow Up Consult  Pharmacy Consult for Heparin Indication: Recent PE, Xarelto on hold  No Known Allergies  Patient Measurements: Height: 5\' 9"  (175.3 cm) Weight: 205 lb 0.4 oz (93 kg) IBW/kg (Calculated) : 70.7 Heparin Dosing Weight:   Vital Signs: Temp: 98.7 F (37.1 C) (03/24 0404) BP: 142/52 (03/24 0404) Pulse Rate: 68 (03/24 0404)  Labs: Recent Labs    05/03/19 0541 05/03/19 2224 05/04/19 0345 05/04/19 0345 05/04/19 1953 05/05/19 0422  HGB 7.0*   < > 7.6*   < > 7.5* 7.5*  HCT 21.2*   < > 22.6*  --  22.7* 22.4*  PLT 340  --  257  --  256 240  HEPARINUNFRC 0.55  --  <0.10*  --   --  0.50  CREATININE 1.29*  --  0.98  --   --  0.93   < > = values in this interval not displayed.    Estimated Creatinine Clearance: 67.8 mL/min (by C-G formula based on SCr of 0.93 mg/dL).   Medications:  Infusions:  . heparin 1,300 Units/hr (05/04/19 1849)    Assessment: Patient with heparin level at goal.  No heparin issues noted.  Goal of Therapy:  Heparin level 0.3-0.7 units/ml Monitor platelets by anticoagulation protocol: Yes   Plan:  Continue heparin drip at current rate Recheck level at [address] [place] 05/05/2019,6:40 AM

## 2019-05-05 NOTE — Progress Notes (Signed)
PROGRESS NOTE    Dennis Stephens  QBV:[id number] DOB: 06-Aug-1933 DOA: 04/25/2019 PCP: [facility]  Brief Narrative: 86 year old male with history of hypertension, A. fib, recent diagnosis of PE, likely metastatic CA with spiculated pulmonary nodules presented with abdominal pain. Patient reported pain 10/10 in upper quadrants, nausea and vomiting. Patient also reported abdominal swelling, yellowing of his eyes and under the tongue. Patient was recently admitted (04/20/2019-04/22/2019)for chest pain and was found to have PE for which he was started on Xarelto. Patient was also found to have hilar and mediastinal lymphadenopathy with possible liver mets, spiculated pulmonary nodules during previous admission, he was scheduled for EBUS and bronchoscopy, declined further work-up inpatient and was discharged at his request. He is requesting evaluation forhospice and palliative care. In ED, patient was found to have elevated lipase, 2207, elevated AST ALT alk phos 569 with leukocytosis. Abdominal ultrasound showed normal gallbladder, biliary duct dilatation with choledocholithiasis, recommended ERCP. Patient admitted for further management. 3/24 -he is requesting a regular diet.denoes abdominal pain nausea or vomiting  Assessment & Plan:   Principal Problem:   Acute pancreatitis Active Problems:   Pulmonary embolism (HCC)   Lung mass   Essential hypertension   Elevated LFTs   Bile duct stone   Duodenal ulcer with hemorrhage   Acute pancreatitis due to gall stones s/p ERCP with sphincterotomy with stone extraction 3/16  Lipase trending down  Finished a course of zosyn  Acute transaminitis -  Upper GI bleed- egd- clean ulcer with no stigmata of recent bleed Hb remained stable Will start eliquis  Dc heparin Monitor in am if stable dc home in am with hospice  PE -starting eliquis today   Hilar and mediastinal lymphadenopathy, spiculated pulmonary nodules Seen on CT during  previous admission, pulmonology was consulted S/P EBUS on 3/17, by PCCM, noted some intraoperative hemorrhage during procedure, 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  Further immunohistochemical stains currently pending Oncology consulted, plan for outpatient follow-up appointment  Acute hypoxic respiratory failure resolved   Hyponatremia resolved   Aki on ckd stage 2 resolved   Essential HTN-  Type 2 dm-will restart amaryl CBG (last 3)  Recent Labs    05/04/19 2348 05/05/19 0356 05/05/19 0752  GLUCAP 129* 127* 128*     Tremor -Due to significant drug interaction with primodone and DOAC will have to DC primidone   GOC paln dc home with hospice per patient request   Estimated body mass index is 30.28 kg/m as calculated from the following:   Height as of this encounter: 5' 9" (1.753 m).   Weight as of this encounter: 93 kg.  DVT prophylaxis: scd  Code Status dnr Family Communication:dw patient  Disposition Plan: came from home  Dc home tomorrow with hospice  Barrier to dc starting DOAC today monitor hb overnight if stable dc in am Consultants:   pccm  Gi  Palliative    Procedures: ERCP on March 16  EBUS on 3/17 EGD on 05/04/19 Antimicrobials: none  Subjective:  Patient requesting a regular diet denies any abdominal pain or nausea vomiting Objective: Vitals:   05/04/19 1959 05/04/19 2023 05/05/19 0404 05/05/19 0738  BP: (!) 154/66  (!) 142/52   Pulse: 71  68   Resp: 18  18   Temp: 97.8 F (36.6 C)  98.7 F (37.1 C)   TempSrc:      SpO2: 100% 97% 100% 96%  Weight:  Height:        Intake/Output Summary (Last 24 hours) at 05/05/2019 1028 Last data filed at 05/05/2019 0900 Gross per 24 hour  Intake 350.37 ml  Output 2175 ml  Net -1824.63 ml   Filed Weights   04/27/19 0842 04/28/19 1331 05/04/19 1327  Weight: 96 kg 93 kg 93 kg    Examination:  General exam:  Appears calm and comfortable  Respiratory system: Clear to auscultation. Respiratory effort normal. Cardiovascular system: S1 & S2 heard, RRR. No JVD, murmurs, rubs, gallops or clicks. No pedal edema. Gastrointestinal system: Abdomen is nondistended, soft and nontender. No organomegaly or masses felt. Normal bowel sounds heard. Central nervous system: Alert and oriented. No focal neurological deficits. Extremities: Symmetric 5 x 5 power. Skin: No rashes, lesions or ulcers Psychiatry: Judgement and insight appear normal. Mood & affect appropriate.     Data Reviewed: I have personally reviewed following labs and imaging studies  CBC: Recent Labs  Lab 05/02/19 0604 05/02/19 0604 05/02/19 1407 05/02/19 1407 05/02/19 2059 05/02/19 2059 05/03/19 0541 05/03/19 2224 05/04/19 0345 05/04/19 1953 05/05/19 0422  WBC 17.0*   < > 17.4*   < > 21.8*  --  26.6*  --  21.7* 14.2* 12.9*  NEUTROABS 12.5*  --  12.7*  --   --   --  20.6*  --  15.5*  --  7.9*  HGB 9.2*   < > 8.5*   < > 7.7*   < > 7.0* 8.2* 7.6* 7.5* 7.5*  HCT 28.2*   < > 25.5*   < > 23.6*   < > 21.2* 24.3* 22.6* 22.7* 22.4*  MCV 94.9   < > 94.4   < > 96.3  --  95.1  --  93.0 94.2 94.1  PLT 320   < > 304   < > 318  --  340  --  257 256 240   < > = values in this interval not displayed.   Basic Metabolic Panel: Recent Labs  Lab 05/01/19 0553 05/02/19 0604 05/03/19 0541 05/04/19 0345 05/05/19 0422  NA 132* 130* 127* 131* 131*  K 4.0 4.4 5.1 4.4 4.3  CL 101 102 100 103 105  CO2 22 21* 21* 21* 21*  GLUCOSE 186* 166* 195* 144* 133*  BUN 21 38* 54* 45* 22  CREATININE 1.06 0.99 1.29* 0.98 0.93  CALCIUM 8.6* 8.5* 8.1* 7.9* 7.8*   GFR: Estimated Creatinine Clearance: 67.8 mL/min (by C-G formula based on SCr of 0.93 mg/dL). Liver Function Tests: Recent Labs  Lab 05/01/19 0553 05/02/19 0604 05/03/19 0541 05/04/19 0345 05/05/19 0422  AST 79* 63* 54* 48* 61*  ALT 118* 92* 79* 65* 71*  ALKPHOS 404* 307* 261* 198* 216*    BILITOT 1.7* 1.1 1.1 1.0 1.0  PROT 6.1* 5.0* 4.8* 4.7* 4.8*  ALBUMIN 2.3* 2.1* 1.9* 2.0* 2.1*   Recent Labs  Lab 05/02/19 0604  LIPASE 95*   No results for input(s): AMMONIA in the last 168 hours. Coagulation Profile: No results for input(s): INR, PROTIME in the last 168 hours. Cardiac Enzymes: No results for input(s): CKTOTAL, CKMB, CKMBINDEX, TROPONINI in the last 168 hours. BNP (last 3 results) No results for input(s): PROBNP in the last 8760 hours. HbA1C: No results for input(s): HGBA1C in the last 72 hours. CBG: Recent Labs  Lab 05/04/19 1605 05/04/19 2007 05/04/19 2348 05/05/19 0356 05/05/19 0752  GLUCAP 164* 163* 129* 127* 128*   Lipid Profile: No results for input(s): CHOL, HDL, LDLCALC, TRIG,  CHOLHDL, LDLDIRECT in the last 72 hours. Thyroid Function Tests: No results for input(s): TSH, T4TOTAL, FREET4, T3FREE, THYROIDAB in the last 72 hours. Anemia Panel: No results for input(s): VITAMINB12, FOLATE, FERRITIN, TIBC, IRON, RETICCTPCT in the last 72 hours. Sepsis Labs: No results for input(s): PROCALCITON, LATICACIDVEN in the last 168 hours.  Recent Results (from the past 240 hour(s))  SARS CORONAVIRUS 2 (TAT 6-24 HRS) Nasopharyngeal Nasopharyngeal Swab     Status: None   Collection Time: 04/25/19  7:59 PM   Specimen: Nasopharyngeal Swab  Result Value Ref Range Status   SARS Coronavirus 2 NEGATIVE NEGATIVE Final    Comment: (NOTE) SARS-CoV-2 target nucleic acids are NOT DETECTED. The SARS-CoV-2 RNA is generally detectable in upper and lower respiratory specimens during the acute phase of infection. Negative results do not preclude SARS-CoV-2 infection, do not rule out co-infections with other pathogens, and should not be used as the sole basis for treatment or other patient management decisions. Negative results must be combined with clinical observations, patient history, and epidemiological information. The expected result is Negative. Fact Sheet for  Patients: SugarRoll.be Fact Sheet for Healthcare Providers: https://www.woods-mathews.com/ This test is not yet approved or cleared by the [place] FDA and  has been authorized for detection and/or diagnosis of SARS-CoV-2 by FDA under an Emergency Use Authorization (EUA). This EUA will remain  in effect (meaning this test can be used) for the duration of the COVID-19 declaration under Section 56 4(b)(1) of the Act, 21 U.S.C. section 360bbb-3(b)(1), unless the authorization is terminated or revoked sooner. Performed at [facility], [place] [address] [place], [place] [address]   Culture, blood (Routine X 2) w Reflex to ID Panel     Status: None   Collection Time: 04/26/19  1:02 PM   Specimen: BLOOD  Result Value Ref Range Status   Specimen Description   Final    BLOOD RIGHT ANTECUBITAL Performed at [facility] [address], [place], [place] [address]    Special Requests   Final    BOTTLES DRAWN AEROBIC AND ANAEROBIC Blood Culture adequate volume Performed at [facility] [address] [place], [place] [address]    Culture   Final    NO GROWTH 5 DAYS Performed at [facility], [place] [address] [place], [place] [address]    Report Status 05/01/2019 FINAL  Final  Culture, blood (Routine X 2) w Reflex to ID Panel     Status: Abnormal   Collection Time: 04/26/19  1:02 PM   Specimen: BLOOD RIGHT HAND  Result Value Ref Range Status   Specimen Description   Final    BLOOD RIGHT HAND Performed at [facility] [address] [place],  [address]    Special Requests   Final    AEROBIC BOTTLE ONLY Blood Culture results may not be optimal due to an inadequate volume of blood received in culture bottles   Culture  Setup Time   Final    AEROBIC BOTTLE ONLY GRAM POSITIVE COCCI IN CLUSTERS CRITICAL RESULT CALLED TO, READ BACK BY AND VERIFIED WITH: L POINDEXTER PHARMD  04/27/19 1810 JDW    Culture (A)  Final    STAPHYLOCOCCUS SPECIES (COAGULASE NEGATIVE) THE SIGNIFICANCE OF ISOLATING THIS ORGANISM FROM A SINGLE SET OF BLOOD CULTURES WHEN MULTIPLE SETS ARE DRAWN IS UNCERTAIN. PLEASE NOTIFY THE MICROBIOLOGY DEPARTMENT WITHIN ONE WEEK IF SPECIATION AND SENSITIVITIES ARE REQUIRED. Performed at [facility], [place]  [address], [place], [place] [address]    Report Status 04/29/2019 FINAL  Final         Radiology Studies: No results found.      Scheduled Meds: . alum & mag hydroxide-simeth  30 mL Oral TID PC  . apixaban  5 mg Oral BID  . bisacodyl  10 mg Rectal Once  . budesonide (PULMICORT) nebulizer solution  0.25 mg Nebulization BID  . dextromethorphan-guaiFENesin  1 tablet Oral BID  . docusate sodium  100 mg Oral BID  . insulin aspart  0-15 Units Subcutaneous Q4H  . pantoprazole (PROTONIX) IV  40 mg Intravenous Q12H  . polyethylene glycol  17 g Oral Daily  . senna  1 tablet Oral BID  . sertraline  100 mg Oral Daily  . sodium chloride flush  10-40 mL Intracatheter Q12H  . sodium chloride flush  3 mL Intravenous Once   Continuous Infusions:   LOS: 10 days     Georgette Shell, MD  05/05/2019, 10:28 AM

## 2019-05-05 NOTE — Discharge Instructions (Addendum)
Flexible Bronchoscopy, Care After This sheet gives you information about how to care for yourself after your test. Your doctor may also give you more specific instructions. If you have problems or questions, contact your doctor. Follow these instructions at home: Eating and drinking  The day after the test, go back to your normal diet. Driving  Do not drive for 24 hours if you were given a medicine to help you relax (sedative).  Do not drive or use heavy machinery while taking prescription pain medicine. General instructions   Take over-the-counter and prescription medicines only as told by your doctor.  Return to your normal activities as told. Ask what activities are safe for you.  Do not use any products that have nicotine or tobacco in them. This includes cigarettes and e-cigarettes. If you need help quitting, ask your doctor.  Keep all follow-up visits as told by your doctor. This is important. It is very important if you had a tissue sample (biopsy) taken. Get help right away if:  You have shortness of breath that gets worse.  You get light-headed.  You feel like you are going to pass out (faint).  You have chest pain.  You cough up: ? More than a little blood. ? More blood than before. Summary  Do not eat or drink anything (not even water) for 2 hours after your test, or until your numbing medicine wears off.  Do not use cigarettes. Do not use e-cigarettes.  Get help right away if you have chest pain. This information is not intended to replace advice given to you by your health care provider. Make sure you discuss any questions you have with your health care provider. Document Revised: 01/10/2017 Document Reviewed: 02/16/2016 Elsevier Patient Education  2020 [place] on my medicine - ELIQUIS (apixaban)  Why was Eliquis prescribed for you? Eliquis was prescribed to treat blood clots that have been found in your lungs (pulmonary embolism)  and to reduce the risk of them occurring again. Eliquis was also prescribed to you due to your abnormal heart rhythm (Atrial Fibrillation), which could increase your risk of stroke.   What do You need to know about Eliquis ? The dose is ONE 5 mg tablet taken TWICE daily.  Eliquis may be taken with or without food.   Try to take the dose about the same time in the morning and in the evening. If you have difficulty swallowing the tablet whole please discuss with your pharmacist how to take the medication safely.  Take Eliquis exactly as prescribed and DO NOT stop taking Eliquis without talking to the doctor who prescribed the medication.  Stopping may increase your risk of developing a new blood clot.  Refill your prescription before you run out.  After discharge, you should have regular check-up appointments with your healthcare provider that is prescribing your Eliquis.    What do you do if you miss a dose? If a dose of ELIQUIS is not taken at the scheduled time, take it as soon as possible on the same day and twice-daily administration should be resumed. The dose should not be doubled to make up for a missed dose.  Important Safety Information A possible side effect of Eliquis is bleeding. You should call your healthcare provider right away if you experience any of the following: ? Bleeding from an injury or your nose that does not stop. ? Unusual colored urine (red or dark brown) or unusual colored stools (red or black). ?  Unusual bruising for unknown reasons. ? A serious fall or if you hit your head (even if there is no bleeding).  Some medicines may interact with Eliquis and might increase your risk of bleeding or clotting while on Eliquis. To help avoid this, consult your healthcare provider or pharmacist prior to using any new prescription or non-prescription medications, including herbals, vitamins, non-steroidal anti-inflammatory drugs (NSAIDs) and supplements.  This website  has more information on Eliquis (apixaban): http://www.eliquis.com/eliquis/home

## 2019-05-05 NOTE — Progress Notes (Signed)
ANTICOAGULATION CONSULT NOTE - Follow Up Consult  Pharmacy Consult for Heparin --> Apixaban Indication: Recent PE  No Known Allergies  Patient Measurements: Height: 5\' 9"  (175.3 cm) Weight: 205 lb 0.4 oz (93 kg) IBW/kg (Calculated) : 70.7 Heparin Dosing Weight: 90 kg  Vital Signs: Temp: 98.7 F (37.1 C) (03/24 0404) BP: 142/52 (03/24 0404) Pulse Rate: 68 (03/24 0404)  Labs: Recent Labs    05/03/19 0541 05/03/19 2224 05/04/19 0345 05/04/19 0345 05/04/19 1953 05/05/19 0422  HGB 7.0*   < > 7.6*   < > 7.5* 7.5*  HCT 21.2*   < > 22.6*  --  22.7* 22.4*  PLT 340  --  257  --  256 240  HEPARINUNFRC 0.55  --  <0.10*  --   --  0.50  CREATININE 1.29*  --  0.98  --   --  0.93   < > = values in this interval not displayed.    Estimated Creatinine Clearance: 67.8 mL/min (by C-G formula based on SCr of 0.93 mg/dL).   Assessment: Pt is a 77 yoM with PMH significant for afib, recently diagnosed PE, recently diagnosed likely metastatic cancer with pulmonary nodules. Pt was recently admitted from 3/9 - 3/11 at which time he was diagnosed with bilateral PE and prescribed rivaroxaban. Rivaroxaban 15 mg PO BID initiated on 3/11, last dose was given at 0815 on 04/26/19. Anticoagulation was converted to heparin drip on 3/15 for planned procedures.   Significant Events:  3/16: ERCP  3/17: Bronch - note that after transbronchial biopsy there was significant bleeding from the left upper lobe  3/21: heparin held for episode of melena x 1; FOBT+, BUN elevated >> PRBC x 2 and PPI gtt started  3/23: upper GI endoscopy found no active bleeding; OK to resume anticoag per GI  Today, 05/05/2019:  Heparin level therapeutic at 0.5 units/mL  CBC:  Hgb low but stable at 7.5. Pltc remains WNL  SCr WNL at 0.93  No bleeding issues noted per nursing  Asked to transition patient to PO Apixaban   Goal of Therapy:  Treatment of VTE Absence of bleeding Monitor platelets by anticoagulation  protocol: Yes   Plan:   Stop heparin infusion now  Start Apixaban 5mg  PO BID (will forego initial 10mg  PO BID x 7 days per discussion with MD due to patient being therapeutically anticoagulated ~ 13 days as well as melena during this admission)   Monitor daily CBC for now  Continue to monitor closely for s/sx of bleeding  Pharmacy to provide education prior to discharge   Lindell Spar, PharmD, BCPS Clinical Pharmacist  05/05/2019, 9:58 AM

## 2019-05-06 ENCOUNTER — Encounter: Payer: Self-pay | Admitting: *Deleted

## 2019-05-06 ENCOUNTER — Other Ambulatory Visit: Payer: Self-pay | Admitting: *Deleted

## 2019-05-06 LAB — CBC WITH DIFFERENTIAL/PLATELET
Abs Immature Granulocytes: 0.56 10*3/uL — ABNORMAL HIGH (ref 0.00–0.07)
Basophils Absolute: 0.1 10*3/uL (ref 0.0–0.1)
Basophils Relative: 1 %
Eosinophils Absolute: 0.2 10*3/uL (ref 0.0–0.5)
Eosinophils Relative: 2 %
HCT: 23.5 % — ABNORMAL LOW (ref 39.0–52.0)
Hemoglobin: 7.7 g/dL — ABNORMAL LOW (ref 13.0–17.0)
Immature Granulocytes: 6 %
Lymphocytes Relative: 20 %
Lymphs Abs: 1.9 10*3/uL (ref 0.7–4.0)
MCH: 31.6 pg (ref 26.0–34.0)
MCHC: 32.8 g/dL (ref 30.0–36.0)
MCV: 96.3 fL (ref 80.0–100.0)
Monocytes Absolute: 1.1 10*3/uL — ABNORMAL HIGH (ref 0.1–1.0)
Monocytes Relative: 12 %
Neutro Abs: 5.7 10*3/uL (ref 1.7–7.7)
Neutrophils Relative %: 59 %
Platelets: 262 10*3/uL (ref 150–400)
RBC: 2.44 MIL/uL — ABNORMAL LOW (ref 4.22–5.81)
RDW: 15.4 % (ref 11.5–15.5)
WBC: 9.5 10*3/uL (ref 4.0–10.5)
nRBC: 0 % (ref 0.0–0.2)

## 2019-05-06 LAB — GLUCOSE, CAPILLARY
Glucose-Capillary: 145 mg/dL — ABNORMAL HIGH (ref 70–99)
Glucose-Capillary: 140 mg/dL — ABNORMAL HIGH (ref 70–99)

## 2019-05-06 MED ORDER — APIXABAN 5 MG PO TABS: 5.0000 mg | ORAL_TABLET | Freq: Two times a day (BID) | ORAL | 2 refills | Status: AC

## 2019-05-06 NOTE — Care Management Important Message (Signed)
Important Message  Patient Details IM Letter given to Roque Lias SW Case Manager to present to the Patient Name: Dennis Stephens MRN: [id number] Date of Birth: 09-27-1933   Medicare Important Message Given:  Yes     Kerin Salen 05/06/2019, 11:07 AM

## 2019-05-06 NOTE — Progress Notes (Signed)
Patient given discharge instructions, and verbalized an understanding of all discharge instructions.  Patient agrees with discharge plan, and is being discharged in stable medical condition.  Patient given transportation via wheelchair. 

## 2019-05-06 NOTE — Discharge Summary (Signed)
Physician Discharge Summary  Delfino Friesen ZOX:[id number] DOB: 1933-07-02 DOA: 04/25/2019  PCP: [facility]  Admit date: 04/25/2019 Discharge date: 05/06/2019  Admitted From: Home  disposition: Home  Recommendations for Outpatient Follow-up:  1. Follow up with PCP in 1-2 weeks 2. Please obtain BMP/CBC in one week 3. Hospice follow-up at home  [place] none Equipment/Devices none  Discharge Condition: Improved CODE STATUS DNR  diet recommendation regular diet per patient request  Brief/Interim Summary: 86 year old male with history of hypertension, A. fib, recent diagnosis of PE, likely metastatic CA with spiculated pulmonary nodules presented with abdominal pain. Patient reported pain 10/10 in upper quadrants, nausea and vomiting. Patient also reported abdominal swelling, yellowing of his eyes and under the tongue. Patient was recently admitted (04/20/2019-04/22/2019)for chest pain and was found to have PE for which he was started on Xarelto. Patient was also found to have hilar and mediastinal lymphadenopathy with possible liver mets, spiculated pulmonary nodules during previous admission, he was scheduled for EBUS and bronchoscopy, declined further work-up inpatient and was discharged at his request. He is requesting evaluation forhospice and palliative care. In ED, patient was found to have elevated lipase, 2207, elevated AST ALT alk phos 569 with leukocytosis. Abdominal ultrasound showed normal gallbladder, biliary duct dilatation with choledocholithiasis, recommended ERCP. Patient admitted for further management.   Discharge Diagnoses:  Principal Problem:   Acute pancreatitis Active Problems:   Pulmonary embolism (HCC)   Lung mass   Essential hypertension   Elevated LFTs   Bile duct stone   Duodenal ulcer with hemorrhage   Acute pancreatitis due to gall stones s/p ERCP with sphincterotomy with stone extraction 3/16  Lipase trending down  Finished a course of  zosyn  Upper GI bleed- egd- clean ulcer with no stigmata of recent bleed Hb remained stable Started Eliquis Heparin stopped  PE -started Eliquis 05/05/2019  Hilar and mediastinal lymphadenopathy, spiculated pulmonary nodules Seen on CT during previous admission, pulmonology was consulted S/P EBUS on 3/17, by PCCM, noted some intraoperative hemorrhage during procedure, 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 Further immunohistochemical stains currently pending Oncology consulted, plan for outpatient follow-up appointment  Acute hypoxic respiratory failure resolved   Hyponatremia resolved  Aki on ckd stage 2 resolved   Essential HTN-continue home meds  Type 2 dm-will restart amaryl  Tremor -Due to significant drug interaction with primodone and DOAC will have to DC primidone   GOC paln dc home with hospice per patient request  Estimated body mass index is 30.28 kg/m as calculated from the following:   Height as of this encounter: '5\' 9"'  (1.753 m).   Weight as of this encounter: 93 kg.  Discharge Instructions  Discharge Instructions    Diet - low sodium heart healthy   Complete by: As directed    Increase activity slowly   Complete by: As directed      Allergies as of 05/06/2019   No Known Allergies     Medication List    STOP taking these medications   primidone 50 MG tablet Commonly known as: MYSOLINE   Rivaroxaban 15 & 20 MG Tbpk   rivaroxaban 20 MG Tabs tablet Commonly known as: XARELTO     TAKE these medications   amLODipine 2.5 MG tablet Commonly known as: NORVASC Take 2.5 mg by mouth daily.   apixaban 5 MG Tabs tablet Commonly known as: ELIQUIS Take 1 tablet (5 mg total) by mouth 2 (two) times  daily.   atorvastatin 40 MG tablet Commonly known as: LIPITOR Take 40 mg by mouth daily.   carvedilol 12.5 MG tablet Commonly known as: COREG Take 12.5 mg by mouth 2 (two)  times daily with a meal.   doxazosin 8 MG tablet Commonly known as: CARDURA Take 8 mg by mouth daily.   glimepiride 2 MG tablet Commonly known as: Amaryl Take 1 tablet (2 mg total) by mouth daily with breakfast.   loratadine 10 MG tablet Commonly known as: CLARITIN Take 1 tablet (10 mg total) by mouth daily as needed for allergies or rhinitis.   losartan 100 MG tablet Commonly known as: COZAAR Take 100 mg by mouth daily.   omeprazole 20 MG capsule Commonly known as: PRILOSEC Take 20 mg by mouth daily.   sertraline 100 MG tablet Commonly known as: ZOLOFT Take 100 mg by mouth daily.   temazepam 30 MG capsule Commonly known as: RESTORIL Take 30 mg by mouth at bedtime as needed for sleep.      Follow-up Information    Icard, Bradley L, DO.   Specialty: Pulmonary Disease Contact information: [address] [place] [address] [phone]        [place] .   Specialty: Radiology Contact information: [address] [id number] [place] [phone]         No Known Allergies  Consultations: *PCCM GI and palliative care  Procedures/Studies: DG Chest 2 View  Result Date: 04/20/2019 CLINICAL DATA:  Chest pain for several hours, history of lung carcinoma EXAM: CHEST - 2 VIEW COMPARISON:  01/20/2019 CT FINDINGS: Cardiac shadow is within normal limits. There is a rounded nodular density identified in the left lung measuring 3.3 cm which corresponds to that seen on the prior exam at which time it measured 1.8 cm. Patchy changes are noted in the right upper lobe also increased when compared with the prior exam. No sizable effusion is noted. No bony abnormality is noted. IMPRESSION: Enlarging bilateral upper lobe nodules as described. This is consistent with the patient's given clinical history Electronically Signed   By: Inez Catalina M.D.   On: 04/20/2019 21:56   CT Head Wo  Contrast  Result Date: 04/21/2019 CLINICAL DATA:  86 year old male with pulmonary emboli, malignant appearing mediastinal lymphadenopathy and liver lesion on CT yesterday. Query brain Mets. EXAM: CT HEAD WITHOUT CONTRAST TECHNIQUE: Contiguous axial images were obtained from the base of the skull through the vertex without intravenous contrast. COMPARISON:  No prior head imaging. FINDINGS: Brain: IV contrast was administered for chest abdomen and pelvis CT 1 hour before these images. Patchy bilateral white matter hypodensity, but no areas suspicious for vasogenic edema. No midline shift, mass effect, or evidence of intracranial mass lesion. No abnormal enhancement identified. No ventriculomegaly. No cortical encephalomalacia identified. No cortically based acute infarct identified. There is a small hypodensity in the posterior right cerebellum on series 2, image 7, probably a small chronic cerebellar infarct. Vascular: Some residual intravascular contrast is present as above. The major intracranial vascular structures seem to be enhancing as expected. Skull: No acute or suspicious osseous lesion identified. Sinuses/Orbits: Visualized paranasal sinuses and mastoids are clear. Other: No acute orbit or scalp soft tissue findings. IMPRESSION: 1. No metastatic disease to the brain is identified by CT with very delayed contrast timing. MRI Head without and with contrast would be most sensitive. 2. No acute intracranial abnormality identified. 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 Electronically Signed   By: Genevie Ann M.D.   On: 04/21/2019 01:08   CT Angio Chest PE W and/or Wo Contrast  Result Date: 04/21/2019 CLINICAL DATA:  Non-specific chest pain and abdominal pain since 1500 hours. History of hypertension and atrial fibrillation. EXAM: CT ANGIOGRAPHY CHEST CT ABDOMEN AND PELVIS WITH CONTRAST TECHNIQUE: Multidetector CT imaging of the chest was  performed using the standard protocol during bolus administration of intravenous contrast. Multiplanar CT image reconstructions and MIPs were obtained to evaluate the vascular anatomy. Multidetector CT imaging of the abdomen and pelvis was performed using the standard protocol during bolus administration of intravenous contrast. Multidetector CT imaging of the chest was performed using the standard protocol during bolus administration of intravenous contrast. Multiplanar CT image reconstructions and MIPs were obtained to evaluate the vascular anatomy. Automatic exposure control utilized. CONTRAST:  157m OMNIPAQUE IOHEXOL 350 MG/ML SOLN COMPARISON:  January 20, 2019. FINDINGS: CTA CHEST FINDINGS Cardiovascular: Pulmonary emboli in the bilateral proximal lobar pulmonary arteries and more peripheral bilateral pulmonary arteries. No central pulmonary artery embolism. Right ventricular to left ventricular ratio 0.7; no right heart strain. Mild cardiomegaly with fatty hypertrophy of the inter atrial septum and four-vessel moderate to severe coronary calcification. Mediastinum/Nodes: Metastatic appearing right paratracheal conglomerate adenopathy measuring 3.2 by 2.4 cm. Metastatic-appearing superior mediastinal lymph node measuring 23 by 16 mm. Metastatic appearing right hilar lymph node measuring 16 by 22 mm. Additional smaller mediastinal and bilateral hilar lymph nodes that could be metastatic or benign. Small hiatal hernia. Lungs/Pleura: No pleural effusion. Bilateral spiculated upper lobe pulmonary nodules measuring 2.4 cm diameter in the left upper lobe on series 2, image 39, and 2.6 x 2.0 cm in the right upper lobe on image 26. Both nodules have increased in size is since December 2020 at which time they measured 19 x 18 mm on the left, and 8 mm diameter on the right. Respiratory motion artifact Upper Abdomen: Reported on concomitant CT abdomen and pelvis reported above. Musculoskeletal: Bone demineralization.  Moderate skeletal degenerative change. A 6 mm sclerotic lesion in the T12 vertebral body stable compared to December 2020. Review of the MIP images confirms the above findings. CT ABDOMEN and PELVIS FINDINGS Hepatobiliary: A new 14 mm hypoattenuating lesion in the right hemi liver, highly worrisome for metastasis. Chronic mild intrahepatic and 14 mm proximal common bile duct dilatation with normal distal tapering. Normal gallbladder. Pancreas: Normal. Spleen: Normal. Adrenals/Urinary Tract: Normal bilateral adrenal glands. Bilateral medical renal disease and simple appearing renal cortical cysts measuring up to 4.4 cm on the right. No hydronephrosis or nephroureterolithiasis bilaterally. No apparent urinary bladder abnormality. A dense 10 mm right renal midpole hypoattenuation. Stomach/Bowel: No bowel obstruction. Moderate stool burden and diverticulosis coli without an acute diverticulitis or apparent bowel wall thickening. Normal appendix, axial image 60. Vascular/Lymphatic: Aortic bilateral iliac in major abdominal artery calcified atherosclerosis with probably clinically significant 75% narrowing of the left common iliac artery. No abdominal aortic aneurysm. Small nonspecific retrocrural, gastrohepatic ligament and retroperitoneal lymph nodes. A mildly enlarged periportal lymph node measuring 12 x 16 mm. No abdominal aortic dissection. Reproductive: Mild prostatomegaly. Other: None. Musculoskeletal: Moderate skeletal degenerative change. A 9 mm ovoid sclerotic lesion in the right proximal femoral shaft, unchanged. Review of the MIP images confirms the above findings. I discussed critical Value/emergent results by telephone at the time of interpretation on 04/21/2019 at 12:12 am with provider LProvidence Lanius, who verbally acknowledged these results. IMPRESSION: Acute bilateral  lobar and peripheral pulmonary emboli without right heart strain. No thoracoabdominal aortic dissection. Suspicious bilateral upper lobe  solid pulmonary nodules, both increased in size since December 2020. Histopathological correlation recommended. An embolic or infectious etiology are less likely differential considerations. Malignant-appearing mediastinal and right hilar adenopathy. Enlarged nonspecific gastrohepatic node. Additional small nonspecific lymph nodes above and below diaphragm which could be metastatic or benign. New hepatic lesion concerning for metastatic disease. Normal bilateral adrenal glands. A new 10 mm dense right renal midpole lesion; renal ultrasonography recommended. A proteinaceous or hemorrhagic cortical cyst or malignant renal lesion are differential considerations. Mild cardiomegaly with four-vessel moderate to severe coronary calcification. Small hiatal hernia. Stable 6 mm T12 and 9 mm right proximal femoral shaft sclerotic lesions since December 2020. Thoracoabdominal aorta and major abdominal artery calcified atherosclerosis without aneurysm. Probably clinically significant 75% atherosclerotic narrowing of the right common iliac artery. Diverticulosis coli. Chronic biliary dilatation. Electronically Signed   By: Revonda Humphrey   On: 04/21/2019 00:17   CT ABDOMEN PELVIS W CONTRAST  Result Date: 05/02/2019 CLINICAL DATA:  86 year old male with history of nausea and vomiting. Suspected bowel obstruction or abdominal abscess/infection. EXAM: CT ABDOMEN AND PELVIS WITH CONTRAST TECHNIQUE: Multidetector CT imaging of the abdomen and pelvis was performed using the standard protocol following bolus administration of intravenous contrast. CONTRAST:  140m OMNIPAQUE IOHEXOL 300 MG/ML  SOLN COMPARISON:  CT the abdomen and pelvis 04/20/2019. FINDINGS: Lower chest: Trace bilateral pleural effusions lying dependently. Atherosclerotic calcifications in the descending thoracic aorta as well as the right coronary artery. Mild fibrotic changes in the lung bases bilaterally, suggestive of nonspecific interstitial pneumonia.  Hepatobiliary: Ill-defined hypovascular areas in the liver, best demonstrated by a 1.6 x 1.5 cm lesion in segment 4B (axial image 21 of series 2), similar to the recent prior examination. No intra or extrahepatic biliary ductal dilatation. Gallbladder is nearly completely decompressed, but the gallbladder wall appears thickened with avid enhancement of the gallbladder mucosa and mild diffuse gallbladder wall edema and subtle surrounding inflammatory changes. Pancreas: No pancreatic mass. No pancreatic ductal dilatation. No pancreatic or peripancreatic fluid collections or inflammatory changes. Spleen: Unremarkable. Adrenals/Urinary Tract: Low-attenuation lesions in the right kidney, compatible with simple cysts, largest of which is an exophytic lesion in the upper pole measuring 4.8 cm. Other subcentimeter low-attenuation lesions in the kidneys bilaterally, too small to characterize, but statistically likely to represent tiny cysts. Bilateral adrenal glands are normal in appearance. No hydroureteronephrosis. Urinary bladder is normal in appearance. Stomach/Bowel: Normal appearance of the stomach. No pathologic dilatation of small bowel or colon. Numerous colonic diverticulae are noted, particularly in the descending colon and sigmoid colon, without surrounding inflammatory changes to suggest an acute diverticulitis at this time. Normal appendix. Vascular/Lymphatic: Aortic atherosclerosis. 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 No lymphadenopathy noted in the abdomen or pelvis. No lymphadenopathy noted in the abdomen or pelvis. Reproductive: Prostate gland and seminal vesicles are unremarkable in appearance. Other: No significant volume of ascites.  No pneumoperitoneum. Musculoskeletal: There are no aggressive appearing lytic or blastic lesions noted in the visualized portions of the skeleton. IMPRESSION: 1. Although the  gallbladder is nearly decompressed, and there are no definite calcified gallstones, the gallbladder wall appears thickened and edematous with hyperenhancement and subtle surrounding inflammatory changes. Clinical correlation for signs and symptoms of a calculus cholecystitis is suggested. 2. Ill-defined hepatic lesions again noted, the possibility of metastatic  disease should be considered. This could be further evaluated with nonemergent abdominal MRI with and without IV gadolinium if clinically appropriate. 3. Colonic diverticulosis without evidence of acute diverticulitis at this time. 4. 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 5. Trace bilateral pleural effusions lying dependently. 6. The appearance of the lung bases suggests interstitial lung disease such as nonspecific interstitial pneumonia (NSIP). Outpatient referral to Pulmonology should be considered in the near future to for further evaluation. 7. Additional incidental findings, as above. Electronically Signed   By: Vinnie Langton M.D.   On: 05/02/2019 18:44   CT ABDOMEN PELVIS W CONTRAST  Result Date: 04/21/2019 CLINICAL DATA:  Non-specific chest pain and abdominal pain since 1500 hours. History of hypertension and atrial fibrillation. EXAM: CT ANGIOGRAPHY CHEST CT ABDOMEN AND PELVIS WITH CONTRAST TECHNIQUE: Multidetector CT imaging of the chest was performed using the standard protocol during bolus administration of intravenous contrast. Multiplanar CT image reconstructions and MIPs were obtained to evaluate the vascular anatomy. Multidetector CT imaging of the abdomen and pelvis was performed using the standard protocol during bolus administration of intravenous contrast. Multidetector CT imaging of the chest was performed using the standard protocol during bolus administration of intravenous contrast. Multiplanar CT image reconstructions and MIPs were obtained to evaluate the  vascular anatomy. Automatic exposure control utilized. CONTRAST:  145m OMNIPAQUE IOHEXOL 350 MG/ML SOLN COMPARISON:  January 20, 2019. FINDINGS: CTA CHEST FINDINGS Cardiovascular: Pulmonary emboli in the bilateral proximal lobar pulmonary arteries and more peripheral bilateral pulmonary arteries. No central pulmonary artery embolism. Right ventricular to left ventricular ratio 0.7; no right heart strain. Mild cardiomegaly with fatty hypertrophy of the inter atrial septum and four-vessel moderate to severe coronary calcification. Mediastinum/Nodes: Metastatic appearing right paratracheal conglomerate adenopathy measuring 3.2 by 2.4 cm. Metastatic-appearing superior mediastinal lymph node measuring 23 by 16 mm. Metastatic appearing right hilar lymph node measuring 16 by 22 mm. Additional smaller mediastinal and bilateral hilar lymph nodes that could be metastatic or benign. Small hiatal hernia. Lungs/Pleura: No pleural effusion. Bilateral spiculated upper lobe pulmonary nodules measuring 2.4 cm diameter in the left upper lobe on series 2, image 39, and 2.6 x 2.0 cm in the right upper lobe on image 26. Both nodules have increased in size is since December 2020 at which time they measured 19 x 18 mm on the left, and 8 mm diameter on the right. Respiratory motion artifact Upper Abdomen: Reported on concomitant CT abdomen and pelvis reported above. Musculoskeletal: Bone demineralization. Moderate skeletal degenerative change. A 6 mm sclerotic lesion in the T12 vertebral body stable compared to December 2020. Review of the MIP images confirms the above findings. CT ABDOMEN and PELVIS FINDINGS Hepatobiliary: A new 14 mm hypoattenuating lesion in the right hemi liver, highly worrisome for metastasis. Chronic mild intrahepatic and 14 mm proximal common bile duct dilatation with normal distal tapering. Normal gallbladder. Pancreas: Normal. Spleen: Normal. Adrenals/Urinary Tract: Normal bilateral adrenal glands. Bilateral  medical renal disease and simple appearing renal cortical cysts measuring up to 4.4 cm on the right. No hydronephrosis or nephroureterolithiasis bilaterally. No apparent urinary bladder abnormality. A dense 10 mm right renal midpole hypoattenuation. Stomach/Bowel: No bowel obstruction. Moderate stool burden and diverticulosis coli without an acute diverticulitis or apparent bowel wall thickening. Normal appendix, axial image 60. Vascular/Lymphatic: Aortic bilateral iliac in major abdominal artery calcified atherosclerosis with probably clinically significant 75% narrowing of the left common iliac artery. No abdominal aortic aneurysm. Small  nonspecific retrocrural, gastrohepatic ligament and retroperitoneal lymph nodes. A mildly enlarged periportal lymph node measuring 12 x 16 mm. No abdominal aortic dissection. Reproductive: Mild prostatomegaly. Other: None. Musculoskeletal: Moderate skeletal degenerative change. A 9 mm ovoid sclerotic lesion in the right proximal femoral shaft, unchanged. Review of the MIP images confirms the above findings. I discussed critical Value/emergent results by telephone at the time of interpretation on 04/21/2019 at 12:12 am with provider Providence Lanius , who verbally acknowledged these results. IMPRESSION: Acute bilateral lobar and peripheral pulmonary emboli without right heart strain. No thoracoabdominal aortic dissection. Suspicious bilateral upper lobe solid pulmonary nodules, both increased in size since December 2020. Histopathological correlation recommended. An embolic or infectious etiology are less likely differential considerations. Malignant-appearing mediastinal and right hilar adenopathy. Enlarged nonspecific gastrohepatic node. Additional small nonspecific lymph nodes above and below diaphragm which could be metastatic or benign. New hepatic lesion concerning for metastatic disease. Normal bilateral adrenal glands. A new 10 mm dense right renal midpole lesion; renal  ultrasonography recommended. A proteinaceous or hemorrhagic cortical cyst or malignant renal lesion are differential considerations. Mild cardiomegaly with four-vessel moderate to severe coronary calcification. Small hiatal hernia. Stable 6 mm T12 and 9 mm right proximal femoral shaft sclerotic lesions since December 2020. Thoracoabdominal aorta and major abdominal artery calcified atherosclerosis without aneurysm. Probably clinically significant 75% atherosclerotic narrowing of the right common iliac artery. Diverticulosis coli. Chronic biliary dilatation. Electronically Signed   By: Revonda Humphrey   On: 04/21/2019 00:17   MR 3D Recon At Scanner  Result Date: 04/26/2019 CLINICAL DATA:  Evaluate for choledocholithiasis. EXAM: MRI ABDOMEN WITHOUT AND WITH CONTRAST (INCLUDING MRCP) TECHNIQUE: Multiplanar multisequence MR imaging of the abdomen was performed both before and after the administration of intravenous contrast. Heavily T2-weighted images of the biliary and pancreatic ducts were obtained, and three-dimensional MRCP images were rendered by post processing. CONTRAST:  4m GADAVIST GADOBUTROL 1 MMOL/ML IV SOLN COMPARISON:  CT AP 04/20/2019 FINDINGS: Lower chest: Trace bilateral pleural effusions. Hepatobiliary: Motion artifact diminishes exam detail within the liver. Corresponding to the CT abnormality is a focal area of increased T2 signal with restricted diffusion within segment 8/4. This measures 1.5 cm. A second more subtle area of increased T2 signal is noted within segment 7, image 12/3 and image 57/5. Enhancement characteristics of both of these lesions are difficult to assess due motion artifact. There is mild diffuse gallbladder wall thickening. Sludge is identified within the neck of the gallbladder. The common bile duct measures 1.1 cm in diameter. At least 4 stones are identified within the distal common bile duct which measure up to 7 mm, image 18/8. Pancreas: Mild diffuse edema is identified.  No main duct dilatation or mass identified. No pancreatic necrosis or pseudocyst formation. Spleen:  Within normal limits in size and appearance. Adrenals/Urinary Tract:  Normal appearance of the adrenal glands. No hydronephrosis identified bilaterally. Bilateral perinephric fat stranding is identified, nonspecific in may be related to prior insult. Several T2 hyperintense kidney lesions are identified. The largest arises from the posterior cortex of the upper pole of left kidney measuring 4.5 cm and is compatible with a simple cyst. No solid enhancing mass or hydronephrosis identified bilaterally. No abnormal signal in or enhancement noted in the region of the recently characterized 10 mm right mid pole lesion. Stomach/Bowel: Visualized portions within the abdomen are unremarkable. Vascular/Lymphatic: Aortic atherosclerosis. No aneurysm. The portal vein remains patent. The splenic vein is also patent. No adenopathy Other:  No free fluid or fluid collections.  Musculoskeletal: No suspicious bone lesions identified. IMPRESSION: 1. Exam is positive for choledocholithiasis. At least 4 stones are noted within the distal measuring up to 7 mm. There is increase caliber of the CBD which measures 1.1 cm in maximum dimension. 2. Gallbladder sludge and mild diffuse gallbladder wall thickening. Cannot rule out cholecystitis 3. Two areas of abnormal increased T2 signal are identified within segment 8/4 and segment 7. The enhancement characteristics of these lesions are difficult to assess due to abnormal motion artifact on the postcontrast images. As mentioned on exam from 04/20/2019 these are new when compared with 01/20/2019. Metastatic disease cannot be excluded. 4. Mild diffuse pancreatic edema compatible with uncomplicated acute pancreatitis. No pancreatic necrosis, main duct dilatation or pseudocyst identified at this time. 5. No MRI correlate to the suspicious right kidney lesion identified on recent CT. This remains  indeterminate and may not be seen due to motion artifact on today's study. Recommend follow-up imaging after resolution of this acute episode. Ideally this should be performed as an outpatient when the patient is able to lay motionless and breath hold. 6. These results will be called to the ordering clinician or representative by the Radiologist Assistant, and communication documented in the PACS or [employer]. Electronically Signed   By: Kerby Moors M.D.   On: 04/26/2019 19:40   DG CHEST PORT 1 VIEW  Result Date: 04/28/2019 CLINICAL DATA:  Postop bronchoscopy. EXAM: PORTABLE CHEST 1 VIEW COMPARISON:  Chest radiograph and CT 04/20/2019 FINDINGS: No visualized pneumothorax post biopsy. Nodule in the left mid lung again seen. Nodular opacities in the right suprahilar region correspond to nodules on CT. Mild cardiomegaly. Unchanged mediastinal contours with aortic atherosclerosis. No significant pleural effusion. There is mild peribronchial thickening. IMPRESSION: 1. No evidence of pneumothorax post biopsy. 2. Left midlung nodule and right suprahilar nodules, as seen on recent CT. Aortic Atherosclerosis (ICD10-I70.0). Electronically Signed   By: Keith Rake M.D.   On: 04/28/2019 17:19   DG Chest Port 1 View  Result Date: 04/25/2019 CLINICAL DATA:  Chest pain and shortness of breath. EXAM: PORTABLE CHEST 1 VIEW COMPARISON:  Chest plain film and chest CT, dated April 20, 2019, are available for comparison. FINDINGS: A stable patchy area of increased opacification is again seen within the mid left lung. A stable similar-appearing area is seen overlying the upper right lung. There is no evidence of a pleural effusion or pneumothorax. The heart size and mediastinal contours are within normal limits. The visualized skeletal structures are unremarkable. IMPRESSION: Stable patchy opacities within the mid left lung and right upper lobe, consistent with the bilateral lung masses seen within these regions on  the prior chest CT dated April 20, 2019. Electronically Signed   By: Virgina Norfolk M.D.   On: 04/25/2019 18:09   DG ERCP BILIARY & PANCREATIC DUCTS  Result Date: 04/27/2019 CLINICAL DATA:  ERCP with sphincterotomy and balloon extraction. EXAM: ERCP TECHNIQUE: Multiple spot images obtained with the fluoroscopic device and submitted for interpretation post-procedure. COMPARISON:  MRCP - 04/26/2019 FLUOROSCOPY TIME:  4 minutes, 28 seconds FINDINGS: Twenty spot intraoperative fluoroscopic images of the right upper abdominal quadrant during ERCP are provided for review Initial image demonstrates an ERCP probe overlying the right upper abdominal quadrant Subsequent images demonstrate selective cannulation and opacification of the common bile duct which appears moderately dilated. There are 4 nonocclusive filling defects within the distal aspect of the CBD (image 4) compatible with choledocholithiasis demonstrated on preceding MRCP. Subsequent images demonstrate insufflation of a balloon  within the central/mid aspect of the CBD with subsequent biliary sweeping and presumed sphincterotomy. There is minimal opacification of the intrahepatic biliary tree which appears nondilated. There is no definitive opacification of either the cystic or pancreatic ducts. IMPRESSION: ERCP with findings of choledocholithiasis with subsequent biliary sweeping and sphincterotomy. These images were submitted for radiologic interpretation only. Please see the procedural report for the amount of contrast and the fluoroscopy time utilized. Electronically Signed   By: Sandi Mariscal M.D.   On: 04/27/2019 11:32   DG Abd Portable 1V  Result Date: 05/01/2019 CLINICAL DATA:  Assess for constipation EXAM: PORTABLE ABDOMEN - 1 VIEW COMPARISON:  None. FINDINGS: A few mildly prominent loops of small bowel are seen in the left abdomen measuring up to 3 cm. Moderate fecal loading is seen throughout the colon. No other abnormalities are identified.  IMPRESSION: Mildly prominent loops of small bowel in the left abdomen could represent mild ileus or early obstruction in the appropriate clinical setting. Moderate fecal loading in the colon. Electronically Signed   By: Dorise Bullion III M.D   On: 05/01/2019 12:20   MR ABDOMEN MRCP W WO CONTAST  Result Date: 04/26/2019 CLINICAL DATA:  Evaluate for choledocholithiasis. EXAM: MRI ABDOMEN WITHOUT AND WITH CONTRAST (INCLUDING MRCP) TECHNIQUE: Multiplanar multisequence MR imaging of the abdomen was performed both before and after the administration of intravenous contrast. Heavily T2-weighted images of the biliary and pancreatic ducts were obtained, and three-dimensional MRCP images were rendered by post processing. CONTRAST:  48m GADAVIST GADOBUTROL 1 MMOL/ML IV SOLN COMPARISON:  CT AP 04/20/2019 FINDINGS: Lower chest: Trace bilateral pleural effusions. Hepatobiliary: Motion artifact diminishes exam detail within the liver. Corresponding to the CT abnormality is a focal area of increased T2 signal with restricted diffusion within segment 8/4. This measures 1.5 cm. A second more subtle area of increased T2 signal is noted within segment 7, image 12/3 and image 57/5. Enhancement characteristics of both of these lesions are difficult to assess due motion artifact. There is mild diffuse gallbladder wall thickening. Sludge is identified within the neck of the gallbladder. The common bile duct measures 1.1 cm in diameter. At least 4 stones are identified within the distal common bile duct which measure up to 7 mm, image 18/8. Pancreas: Mild diffuse edema is identified. No main duct dilatation or mass identified. No pancreatic necrosis or pseudocyst formation. Spleen:  Within normal limits in size and appearance. Adrenals/Urinary Tract:  Normal appearance of the adrenal glands. No hydronephrosis identified bilaterally. Bilateral perinephric fat stranding is identified, nonspecific in may be related to prior insult.  Several T2 hyperintense kidney lesions are identified. The largest arises from the posterior cortex of the upper pole of left kidney measuring 4.5 cm and is compatible with a simple cyst. No solid enhancing mass or hydronephrosis identified bilaterally. No abnormal signal in or enhancement noted in the region of the recently characterized 10 mm right mid pole lesion. Stomach/Bowel: Visualized portions within the abdomen are unremarkable. Vascular/Lymphatic: Aortic atherosclerosis. No aneurysm. The portal vein remains patent. The splenic vein is also patent. No adenopathy Other:  No free fluid or fluid collections. Musculoskeletal: No suspicious bone lesions identified. IMPRESSION: 1. Exam is positive for choledocholithiasis. At least 4 stones are noted within the distal measuring up to 7 mm. There is increase caliber of the CBD which measures 1.1 cm in maximum dimension. 2. Gallbladder sludge and mild diffuse gallbladder wall thickening. Cannot rule out cholecystitis 3. Two areas of abnormal increased T2 signal are identified  within segment 8/4 and segment 7. The enhancement characteristics of these lesions are difficult to assess due to abnormal motion artifact on the postcontrast images. As mentioned on exam from 04/20/2019 these are new when compared with 01/20/2019. Metastatic disease cannot be excluded. 4. Mild diffuse pancreatic edema compatible with uncomplicated acute pancreatitis. No pancreatic necrosis, main duct dilatation or pseudocyst identified at this time. 5. No MRI correlate to the suspicious right kidney lesion identified on recent CT. This remains indeterminate and may not be seen due to motion artifact on today's study. Recommend follow-up imaging after resolution of this acute episode. Ideally this should be performed as an outpatient when the patient is able to lay motionless and breath hold. 6. These results will be called to the ordering clinician or representative by the Radiologist  Assistant, and communication documented in the PACS or [employer]. Electronically Signed   By: Kerby Moors M.D.   On: 04/26/2019 19:40   ECHOCARDIOGRAM COMPLETE  Result Date: 04/21/2019    ECHOCARDIOGRAM REPORT   Patient Name:   LAQUENTIN LOUDERMILK Date of Exam: 04/21/2019 Medical Rec #:  [id number]       Height:       69.0 in Accession #:    [id number]      Weight:       213.0 lb Date of Birth:  [birth year]       BSA:          2.122 m Patient Age:    11 years        BP:           159/76 mmHg Patient Gender: M               HR:           98 bpm. Exam Location:  Inpatient Procedure: 2D Echo, Cardiac Doppler, Color Doppler and Intracardiac            Opacification Agent Indications:    Pulmonary embolus 415.19  History:        Patient has no prior history of Echocardiogram examinations.                 Risk Factors:Hypertension and Former Smoker. Pulmonary embolus.  Sonographer:    Vickie Epley RDCS Referring Phys: [place]  Sonographer Comments: Technically difficult study due to poor echo windows. IMPRESSIONS  1. Left ventricular ejection fraction, by estimation, is 55 to 60%. The left ventricle has normal function. The left ventricle has no regional wall motion abnormalities. Left ventricular diastolic parameters are consistent with Grade I diastolic dysfunction (impaired relaxation).  2. RV not well visualized. Appears to have low normal function. There is ventricular septal dyssynchrony - may be due to bundle branch block. Negative McConnell's sign.. Right ventricular systolic function is low normal. The right ventricular size is normal.  3. The mitral valve is grossly normal. Trivial mitral valve regurgitation.  4. The aortic valve is tricuspid. Aortic valve regurgitation is not visualized.  5. The inferior vena cava is dilated in size with >50% respiratory variability, suggesting right atrial pressure of 8 mmHg. FINDINGS  Left Ventricle: Left ventricular ejection fraction, by estimation, is  55 to 60%. The left ventricle has normal function. The left ventricle has no regional wall motion abnormalities. Definity contrast agent was given IV to delineate the left ventricular  endocardial borders. The left ventricular internal cavity size was normal in size. There is no left ventricular hypertrophy. Abnormal (paradoxical) septal motion, consistent with left  bundle branch block. Left ventricular diastolic parameters are consistent with Grade I diastolic dysfunction (impaired relaxation). Indeterminate filling pressures. Right Ventricle: RV not well visualized. Appears to have low normal function. There is ventricular septal dyssynchrony - may be due to bundle branch block. Negative McConnell's sign. The right ventricular size is normal. No increase in right ventricular wall thickness. Right ventricular systolic function is low normal. Left Atrium: Left atrial size was normal in size. Right Atrium: Right atrial size was normal in size. Pericardium: Trivial pericardial effusion is present. The pericardial effusion is localized near the right ventricle. Mitral Valve: The mitral valve is grossly normal. Trivial mitral valve regurgitation. Tricuspid Valve: The tricuspid valve is not well visualized. Tricuspid valve regurgitation is not demonstrated. Aortic Valve: The aortic valve is tricuspid. Aortic valve regurgitation is not visualized. Pulmonic Valve: The pulmonic valve was grossly normal. Pulmonic valve regurgitation is not visualized. Aorta: The aortic root, ascending aorta, aortic arch and descending aorta are all structurally normal, with no evidence of dilitation or obstruction. Venous: The inferior vena cava is dilated in size with greater than 50% respiratory variability, suggesting right atrial pressure of 8 mmHg. IAS/Shunts: No atrial level shunt detected by color flow Doppler.  LEFT VENTRICLE PLAX 2D LVIDd:         5.30 cm  Diastology LVIDs:         3.70 cm  LV e' lateral:   7.40 cm/s LV PW:          1.00 cm  LV E/e' lateral: 10.5 LV IVS:        1.00 cm  LV e' medial:    5.98 cm/s LVOT diam:     2.10 cm  LV E/e' medial:  13.0 LV SV:         67 LV SV Index:   32 LVOT Area:     3.46 cm  RIGHT VENTRICLE RV S prime:     10.60 cm/s TAPSE (M-mode): 1.9 cm LEFT ATRIUM             Index LA diam:        3.80 cm 1.79 cm/m LA Vol (A2C):   42.8 ml 20.17 ml/m LA Vol (A4C):   42.4 ml 19.98 ml/m LA Biplane Vol: 44.3 ml 20.88 ml/m  AORTIC VALVE LVOT Vmax:   107.00 cm/s LVOT Vmean:  81.100 cm/s LVOT VTI:    0.194 m  AORTA Ao Root diam: 3.50 cm MITRAL VALVE MV Area (PHT): 4.63 cm     SHUNTS MV Decel Time: 164 msec     Systemic VTI:  0.19 m MV E velocity: 77.70 cm/s   Systemic Diam: 2.10 cm MV A velocity: 108.00 cm/s MV E/A ratio:  0.72 Lyman Bishop MD Electronically signed by Lyman Bishop MD Signature Date/Time: 04/21/2019/10:53:47 AM    Final    VAS [place] LOWER EXTREMITY VENOUS (DVT)  Result Date: 04/21/2019  Lower Venous DVTStudy Indications: Pulmonary embolism.  Comparison Study: no prior Performing Technologist: [name] RVS  Examination Guidelines: A complete evaluation includes B-mode imaging, spectral Doppler, color Doppler, and power Doppler as needed of all accessible portions of each vessel. Bilateral testing is considered an integral part of a complete examination. Limited examinations for reoccurring indications may be performed as noted. The reflux portion of the exam is performed with the patient in reverse Trendelenburg.  +---------+---------------+---------+-----------+----------+--------------+ RIGHT    CompressibilityPhasicitySpontaneityPropertiesThrombus Aging +---------+---------------+---------+-----------+----------+--------------+ CFV      Full           Yes  Yes                                 +---------+---------------+---------+-----------+----------+--------------+ SFJ      Full                                                         +---------+---------------+---------+-----------+----------+--------------+ FV Prox  Full                                                        +---------+---------------+---------+-----------+----------+--------------+ FV Mid   Full                                                        +---------+---------------+---------+-----------+----------+--------------+ FV DistalFull                                                        +---------+---------------+---------+-----------+----------+--------------+ PFV      Full                                                        +---------+---------------+---------+-----------+----------+--------------+ POP      Full           Yes      Yes                                 +---------+---------------+---------+-----------+----------+--------------+ PTV      Full                                                        +---------+---------------+---------+-----------+----------+--------------+ PERO                                                  Not visualized +---------+---------------+---------+-----------+----------+--------------+   +---------+---------------+---------+-----------+----------+--------------+ LEFT     CompressibilityPhasicitySpontaneityPropertiesThrombus Aging +---------+---------------+---------+-----------+----------+--------------+ CFV      Full           Yes      Yes                                 +---------+---------------+---------+-----------+----------+--------------+ SFJ      Full                                                        +---------+---------------+---------+-----------+----------+--------------+  FV Prox  Full                                                        +---------+---------------+---------+-----------+----------+--------------+ FV Mid   Full                                                         +---------+---------------+---------+-----------+----------+--------------+ FV DistalFull                                                        +---------+---------------+---------+-----------+----------+--------------+ PFV      Full                                                        +---------+---------------+---------+-----------+----------+--------------+ POP      Full           Yes      Yes                                 +---------+---------------+---------+-----------+----------+--------------+ PTV                                                   Not visualized +---------+---------------+---------+-----------+----------+--------------+ PERO                                                  Not visualized +---------+---------------+---------+-----------+----------+--------------+     Summary: BILATERAL: - No evidence of deep vein thrombosis seen in the lower extremities, bilaterally.   *See table(s) above for measurements and observations. Electronically signed by Curt Jews MD on 04/21/2019 at 2:50:03 PM.    Final    US Abdomen Limited RUQ  Result Date: 04/26/2019 CLINICAL DATA:  Pancreatitis.  Hypertension.  Diabetes. EXAM: ULTRASOUND ABDOMEN LIMITED RIGHT UPPER QUADRANT COMPARISON:  04/20/2019 abdominopelvic CT FINDINGS: Gallbladder: No gallstones or wall thickening visualized. No sonographic Murphy sign noted by sonographer. Common bile duct: Diameter: Dilated, including at 1.3 cm. compared to the recent CT, common duct stone or stones are present distally. Liver: Central right hepatic lobe hypoechoic lesion of 2.0 cm, as on CT. Portal vein is patent on color Doppler imaging with normal direction of blood flow towards the liver. Other: No ascites. IMPRESSION: 1. Normal gallbladder. 2. Biliary duct dilatation with choledocholithiasis. Consider further evaluation with ERCP. 3. Right hepatic lobe lesion as on CT. These results will be called to the ordering clinician  or representative by the Radiologist Assistant, and communication documented in the PACS or [employer]. Electronically  Signed   By: Abigail Miyamoto M.D.   On: 04/26/2019 07:16   DG C-ARM BRONCHOSCOPY  Result Date: 04/28/2019 C-ARM BRONCHOSCOPY: Fluoroscopy was utilized by the requesting physician.  No radiographic interpretation.    (Echo, Carotid, EGD, Colonoscopy, ERCP)    Subjective: He is resting in bed in no acute distress tolerating p.o. intake denies abdominal pain nausea vomiting  Discharge Exam: Vitals:   05/05/19 2128 05/06/19 0445  BP: (!) 155/40 (!) 158/52  Pulse: 81 91  Resp: 17 20  Temp: 98.1 F (36.7 C) 98.2 F (36.8 C)  SpO2: 97% 95%   Vitals:   05/05/19 1507 05/05/19 2019 05/05/19 2128 05/06/19 0445  BP: (!) 149/77  (!) 155/40 (!) 158/52  Pulse: 79  81 91  Resp:   17 20  Temp: 98.2 F (36.8 C)  98.1 F (36.7 C) 98.2 F (36.8 C)  TempSrc: Oral  Oral Oral  SpO2: 98% 96% 97% 95%  Weight:      Height:        General: Pt is alert, awake, not in acute distress Cardiovascular: RRR, S1/S2 +, no rubs, no gallops Respiratory: CTA bilaterally, no wheezing, no rhonchi Abdominal: Soft, NT, ND, bowel sounds + Extremities: no edema, no cyanosis    The results of significant diagnostics from this hospitalization (including imaging, microbiology, ancillary and laboratory) are listed below for reference.     Microbiology: Recent Results (from the past 240 hour(s))  Culture, blood (Routine X 2) w Reflex to ID Panel     Status: None   Collection Time: 04/26/19  1:02 PM   Specimen: BLOOD  Result Value Ref Range Status   Specimen Description   Final    BLOOD RIGHT ANTECUBITAL Performed at [facility] [address] [place], [place] [address]    Special Requests   Final    BOTTLES DRAWN AEROBIC AND ANAEROBIC Blood Culture adequate volume Performed at [facility] [address], [place], [place] [address]     Culture   Final    NO GROWTH 5 DAYS Performed at [facility], [place] [address], [place], [place] [address]    Report Status 05/01/2019 FINAL  Final  Culture, blood (Routine X 2) w Reflex to ID Panel     Status: Abnormal   Collection Time: 04/26/19  1:02 PM   Specimen: BLOOD RIGHT HAND  Result Value Ref Range Status   Specimen Description   Final    BLOOD RIGHT HAND Performed at [facility] [address] [place], [place] [address]    Special Requests   Final    AEROBIC BOTTLE ONLY Blood Culture results may not be optimal due to an inadequate volume of blood received in culture bottles   Culture  Setup Time   Final    AEROBIC BOTTLE ONLY GRAM POSITIVE COCCI IN CLUSTERS CRITICAL RESULT CALLED TO, READ BACK BY AND VERIFIED WITH: L POINDEXTER PHARMD 04/27/19 1810 JDW    Culture (A)  Final    STAPHYLOCOCCUS SPECIES (COAGULASE NEGATIVE) THE SIGNIFICANCE OF ISOLATING THIS ORGANISM FROM A SINGLE SET OF BLOOD CULTURES WHEN MULTIPLE SETS ARE DRAWN IS UNCERTAIN. PLEASE NOTIFY THE MICROBIOLOGY DEPARTMENT WITHIN ONE WEEK IF SPECIATION AND SENSITIVITIES ARE REQUIRED. Performed at [facility], [place] [address], [place], [place] [address]    Report Status 04/29/2019 FINAL  Final     Labs: BNP (last 3 results) Recent Labs    04/25/19 1730  BNP 128.3*  Basic Metabolic Panel: Recent Labs  Lab 05/01/19 0553 05/02/19 0604 05/03/19 0541 05/04/19 0345 05/05/19 0422  NA 132* 130* 127* 131* 131*  K 4.0 4.4 5.1 4.4 4.3  CL 101 102 100 103 105  CO2 22 21* 21* 21* 21*  GLUCOSE 186* 166* 195* 144* 133*  BUN 21 38* 54* 45* 22  CREATININE 1.06 0.99 1.29* 0.98 0.93  CALCIUM 8.6* 8.5* 8.1* 7.9* 7.8*   Liver Function Tests: Recent Labs  Lab 05/01/19 0553 05/02/19 0604 05/03/19 0541 05/04/19 0345 05/05/19 0422  AST 79* 63* 54* 48* 61*  ALT 118* 92* 79* 65* 71*  ALKPHOS 404* 307* 261* 198* 216*  BILITOT 1.7* 1.1 1.1 1.0 1.0  PROT 6.1* 5.0* 4.8* 4.7* 4.8*   ALBUMIN 2.3* 2.1* 1.9* 2.0* 2.1*   Recent Labs  Lab 05/02/19 0604  LIPASE 95*   No results for input(s): AMMONIA in the last 168 hours. CBC: Recent Labs  Lab 05/02/19 1407 05/02/19 2059 05/03/19 0541 05/03/19 0541 05/03/19 2224 05/04/19 0345 05/04/19 1953 05/05/19 0422 05/06/19 0535  WBC 17.4*   < > 26.6*  --   --  21.7* 14.2* 12.9* 9.5  NEUTROABS 12.7*  --  20.6*  --   --  15.5*  --  7.9* 5.7  HGB 8.5*   < > 7.0*   < > 8.2* 7.6* 7.5* 7.5* 7.7*  HCT 25.5*   < > 21.2*   < > 24.3* 22.6* 22.7* 22.4* 23.5*  MCV 94.4   < > 95.1  --   --  93.0 94.2 94.1 96.3  PLT 304   < > 340  --   --  257 256 240 262   < > = values in this interval not displayed.   Cardiac Enzymes: No results for input(s): CKTOTAL, CKMB, CKMBINDEX, TROPONINI in the last 168 hours. BNP: Invalid input(s): POCBNP CBG: Recent Labs  Lab 05/05/19 1646 05/05/19 2051 05/05/19 2336 05/06/19 0436 05/06/19 0728  GLUCAP 159* 151* 144* 145* 140*   D-Dimer No results for input(s): DDIMER in the last 72 hours. Hgb A1c No results for input(s): HGBA1C in the last 72 hours. Lipid Profile No results for input(s): CHOL, HDL, LDLCALC, TRIG, CHOLHDL, LDLDIRECT in the last 72 hours. Thyroid function studies No results for input(s): TSH, T4TOTAL, T3FREE, THYROIDAB in the last 72 hours.  Invalid input(s): FREET3 Anemia work up No results for input(s): VITAMINB12, FOLATE, FERRITIN, TIBC, IRON, RETICCTPCT in the last 72 hours. Urinalysis    Component Value Date/Time   COLORURINE AMBER (A) 04/25/2019 1721   APPEARANCEUR CLEAR 04/25/2019 1721   LABSPEC 1.006 04/25/2019 1721   PHURINE 5.0 04/25/2019 1721   GLUCOSEU NEGATIVE 04/25/2019 1721   HGBUR SMALL (A) 04/25/2019 1721   BILIRUBINUR NEGATIVE 04/25/2019 1721   KETONESUR NEGATIVE 04/25/2019 1721   PROTEINUR NEGATIVE 04/25/2019 1721   NITRITE NEGATIVE 04/25/2019 1721   LEUKOCYTESUR NEGATIVE 04/25/2019 1721   Sepsis Labs Invalid input(s): PROCALCITONIN,  WBC,   LACTICIDVEN Microbiology Recent Results (from the past 240 hour(s))  Culture, blood (Routine X 2) w Reflex to ID Panel     Status: None   Collection Time: 04/26/19  1:02 PM   Specimen: BLOOD  Result Value Ref Range Status   Specimen Description   Final    BLOOD RIGHT ANTECUBITAL Performed at [facility], [place] [address] [place], [place] [address]    Special Requests   Final    BOTTLES DRAWN AEROBIC AND ANAEROBIC Blood Culture adequate volume Performed at  [facility], [place] [address] [place], [place] [address]    Culture   Final    NO GROWTH 5 DAYS Performed at [facility], [place] [address], [place], [place] [address]    Report Status 05/01/2019 FINAL  Final  Culture, blood (Routine X 2) w Reflex to ID Panel     Status: Abnormal   Collection Time: 04/26/19  1:02 PM   Specimen: BLOOD RIGHT HAND  Result Value Ref Range Status   Specimen Description   Final    BLOOD RIGHT HAND Performed at [place] [address], [place], [place] [address]    Special Requests   Final    AEROBIC BOTTLE ONLY Blood Culture results may not be optimal due to an inadequate volume of blood received in culture bottles   Culture  Setup Time   Final    AEROBIC BOTTLE ONLY GRAM POSITIVE COCCI IN CLUSTERS CRITICAL RESULT CALLED TO, READ BACK BY AND VERIFIED WITH: L POINDEXTER PHARMD 04/27/19 1810 JDW    Culture (A)  Final    STAPHYLOCOCCUS SPECIES (COAGULASE NEGATIVE) THE SIGNIFICANCE OF ISOLATING THIS ORGANISM FROM A SINGLE SET OF BLOOD CULTURES WHEN MULTIPLE SETS ARE DRAWN IS UNCERTAIN. PLEASE NOTIFY THE MICROBIOLOGY DEPARTMENT WITHIN ONE WEEK IF SPECIATION AND SENSITIVITIES ARE REQUIRED. Performed at [facility], [place] [address] [place], [place] [address]    Report Status 04/29/2019 FINAL  Final     Time coordinating discharge:  36 minutes  SIGNED:   Georgette Shell, MD  [facility] 05/06/2019, 9:56 AM Pager    If 7PM-7AM, please contact night-coverage [email or web address] Password [email or web address]

## 2019-05-06 NOTE — Progress Notes (Signed)
The proposed treatment discussed in cancer conference 05/06/19 is for discussion purpose only and not a binding recommendation.  The patient was not physically examined nor present for their treatment options.  Therefore, final treatment plans cannot be decided.

## 2019-05-06 NOTE — TOC Transition Note (Signed)
Transition of Care [facility]) - CM/SW Discharge Note   Patient Details  Name: Dennis Stephens MRN: [id number] Date of Birth: [birth year]  [facility] CM/SW Contact:  Trish Mage, LCSW Phone Number: 05/06/2019, 10:53 AM   Clinical Narrative:  Patient to d/c today.  Bevely Palmer with [facility] alerted.  Gave DNR to wife and patient, and spoke to them about transportation.  He elects to go home with wife in car.  They assured me that his sister is there and will help him get into the house from the car.  No further needs identified. TOC sign off.     Final next level of care: Home w Hospice Care Barriers to Discharge: No Barriers Identified   Patient Goals and CMS Choice Patient states their goals for this hospitalization and ongoing recovery are:: "I want to get home"      Discharge Placement                       Discharge Plan and Services In-house Referral: Clinical Social Work                                   Social Determinants of Health (SDOH) Interventions     Readmission Risk Interventions Readmission Risk Prevention Plan 04/29/2019  Transportation Screening Complete  PCP or Specialist Appt within 5-7 Days Complete  Home Care Screening Complete  Medication Review (RN CM) Complete

## 2019-05-10 ENCOUNTER — Inpatient Hospital Stay: Payer: Medicare Other

## 2019-05-10 ENCOUNTER — Inpatient Hospital Stay: Payer: Medicare Other | Attending: Internal Medicine | Admitting: Internal Medicine

## 2019-05-10 DIAGNOSIS — I2699 Other pulmonary embolism without acute cor pulmonale: Secondary | ICD-10-CM | POA: Insufficient documentation

## 2019-05-10 DIAGNOSIS — K769 Liver disease, unspecified: Secondary | ICD-10-CM | POA: Insufficient documentation

## 2019-05-10 DIAGNOSIS — I700 Atherosclerosis of aorta: Secondary | ICD-10-CM | POA: Insufficient documentation

## 2019-05-10 DIAGNOSIS — R5383 Other fatigue: Secondary | ICD-10-CM | POA: Insufficient documentation

## 2019-05-10 DIAGNOSIS — C3492 Malignant neoplasm of unspecified part of left bronchus or lung: Secondary | ICD-10-CM | POA: Insufficient documentation

## 2019-05-10 DIAGNOSIS — C3491 Malignant neoplasm of unspecified part of right bronchus or lung: Secondary | ICD-10-CM | POA: Insufficient documentation

## 2019-05-10 DIAGNOSIS — K859 Acute pancreatitis without necrosis or infection, unspecified: Secondary | ICD-10-CM | POA: Insufficient documentation

## 2019-05-10 DIAGNOSIS — I10 Essential (primary) hypertension: Secondary | ICD-10-CM | POA: Insufficient documentation

## 2019-05-10 DIAGNOSIS — R079 Chest pain, unspecified: Secondary | ICD-10-CM | POA: Insufficient documentation

## 2019-05-10 DIAGNOSIS — I313 Pericardial effusion (noninflammatory): Secondary | ICD-10-CM | POA: Insufficient documentation

## 2019-05-10 DIAGNOSIS — Z79899 Other long term (current) drug therapy: Secondary | ICD-10-CM | POA: Insufficient documentation

## 2019-05-10 DIAGNOSIS — I4891 Unspecified atrial fibrillation: Secondary | ICD-10-CM | POA: Insufficient documentation

## 2019-05-10 DIAGNOSIS — K449 Diaphragmatic hernia without obstruction or gangrene: Secondary | ICD-10-CM | POA: Insufficient documentation

## 2019-05-10 DIAGNOSIS — K8689 Other specified diseases of pancreas: Secondary | ICD-10-CM | POA: Insufficient documentation

## 2019-05-10 DIAGNOSIS — Z7901 Long term (current) use of anticoagulants: Secondary | ICD-10-CM | POA: Insufficient documentation

## 2019-05-10 DIAGNOSIS — C787 Secondary malignant neoplasm of liver and intrahepatic bile duct: Secondary | ICD-10-CM | POA: Insufficient documentation

## 2019-05-10 DIAGNOSIS — K838 Other specified diseases of biliary tract: Secondary | ICD-10-CM | POA: Insufficient documentation

## 2019-05-10 DIAGNOSIS — R590 Localized enlarged lymph nodes: Secondary | ICD-10-CM | POA: Insufficient documentation

## 2019-05-10 DIAGNOSIS — E119 Type 2 diabetes mellitus without complications: Secondary | ICD-10-CM | POA: Insufficient documentation

## 2019-05-11 ENCOUNTER — Telehealth: Payer: Self-pay | Admitting: Medical Oncology

## 2019-05-11 ENCOUNTER — Telehealth: Payer: Self-pay | Admitting: Internal Medicine

## 2019-05-11 NOTE — Telephone Encounter (Signed)
Scheduled appt per 3/30 sch message - pt aware of appt date and time

## 2019-05-11 NOTE — Telephone Encounter (Signed)
Yes if I have availability.

## 2019-05-11 NOTE — Telephone Encounter (Signed)
Wants treatment and r/s appt with Dennis Stephens   "I just got out of hospital and asked for Hospice . Now I want to get treatment". He said he missed appt yesterday "because I put the cart before the horse".

## 2019-05-12 ENCOUNTER — Other Ambulatory Visit: Payer: Self-pay

## 2019-05-12 ENCOUNTER — Inpatient Hospital Stay (HOSPITAL_BASED_OUTPATIENT_CLINIC_OR_DEPARTMENT_OTHER): Payer: Medicare Other | Admitting: Internal Medicine

## 2019-05-12 ENCOUNTER — Encounter: Payer: Self-pay | Admitting: Internal Medicine

## 2019-05-12 ENCOUNTER — Inpatient Hospital Stay: Payer: Medicare Other

## 2019-05-12 DIAGNOSIS — K859 Acute pancreatitis without necrosis or infection, unspecified: Secondary | ICD-10-CM | POA: Diagnosis not present

## 2019-05-12 DIAGNOSIS — K8689 Other specified diseases of pancreas: Secondary | ICD-10-CM | POA: Diagnosis not present

## 2019-05-12 DIAGNOSIS — K449 Diaphragmatic hernia without obstruction or gangrene: Secondary | ICD-10-CM | POA: Diagnosis not present

## 2019-05-12 DIAGNOSIS — R590 Localized enlarged lymph nodes: Secondary | ICD-10-CM | POA: Diagnosis not present

## 2019-05-12 DIAGNOSIS — I700 Atherosclerosis of aorta: Secondary | ICD-10-CM | POA: Diagnosis not present

## 2019-05-12 DIAGNOSIS — E119 Type 2 diabetes mellitus without complications: Secondary | ICD-10-CM | POA: Diagnosis not present

## 2019-05-12 DIAGNOSIS — I4891 Unspecified atrial fibrillation: Secondary | ICD-10-CM | POA: Diagnosis not present

## 2019-05-12 DIAGNOSIS — K769 Liver disease, unspecified: Secondary | ICD-10-CM | POA: Diagnosis not present

## 2019-05-12 DIAGNOSIS — I2699 Other pulmonary embolism without acute cor pulmonale: Secondary | ICD-10-CM | POA: Diagnosis not present

## 2019-05-12 DIAGNOSIS — Z5111 Encounter for antineoplastic chemotherapy: Secondary | ICD-10-CM | POA: Diagnosis not present

## 2019-05-12 DIAGNOSIS — Z5112 Encounter for antineoplastic immunotherapy: Secondary | ICD-10-CM | POA: Insufficient documentation

## 2019-05-12 DIAGNOSIS — C3490 Malignant neoplasm of unspecified part of unspecified bronchus or lung: Secondary | ICD-10-CM

## 2019-05-12 DIAGNOSIS — I313 Pericardial effusion (noninflammatory): Secondary | ICD-10-CM | POA: Diagnosis not present

## 2019-05-12 DIAGNOSIS — R079 Chest pain, unspecified: Secondary | ICD-10-CM | POA: Diagnosis not present

## 2019-05-12 DIAGNOSIS — Z7901 Long term (current) use of anticoagulants: Secondary | ICD-10-CM | POA: Diagnosis not present

## 2019-05-12 DIAGNOSIS — R5383 Other fatigue: Secondary | ICD-10-CM | POA: Diagnosis not present

## 2019-05-12 DIAGNOSIS — Z7189 Other specified counseling: Secondary | ICD-10-CM

## 2019-05-12 DIAGNOSIS — C3491 Malignant neoplasm of unspecified part of right bronchus or lung: Secondary | ICD-10-CM | POA: Diagnosis not present

## 2019-05-12 DIAGNOSIS — I10 Essential (primary) hypertension: Secondary | ICD-10-CM | POA: Diagnosis not present

## 2019-05-12 DIAGNOSIS — C3492 Malignant neoplasm of unspecified part of left bronchus or lung: Secondary | ICD-10-CM | POA: Diagnosis not present

## 2019-05-12 DIAGNOSIS — Z79899 Other long term (current) drug therapy: Secondary | ICD-10-CM | POA: Diagnosis not present

## 2019-05-12 DIAGNOSIS — C787 Secondary malignant neoplasm of liver and intrahepatic bile duct: Secondary | ICD-10-CM | POA: Diagnosis not present

## 2019-05-12 DIAGNOSIS — R918 Other nonspecific abnormal finding of lung field: Secondary | ICD-10-CM

## 2019-05-12 DIAGNOSIS — K838 Other specified diseases of biliary tract: Secondary | ICD-10-CM | POA: Diagnosis not present

## 2019-05-12 LAB — CMP (CANCER CENTER ONLY)
ALT: 51 U/L — ABNORMAL HIGH (ref 0–44)
AST: 39 U/L (ref 15–41)
Albumin: 2.6 g/dL — ABNORMAL LOW (ref 3.5–5.0)
Alkaline Phosphatase: 287 U/L — ABNORMAL HIGH (ref 38–126)
Anion gap: 10 (ref 5–15)
BUN: 10 mg/dL (ref 8–23)
CO2: 21 mmol/L — ABNORMAL LOW (ref 22–32)
Calcium: 8.5 mg/dL — ABNORMAL LOW (ref 8.9–10.3)
Chloride: 105 mmol/L (ref 98–111)
Creatinine: 0.98 mg/dL (ref 0.61–1.24)
GFR, Est AFR Am: >60 mL/min (ref >60)
GFR, Estimated: >60 mL/min (ref >60)
Glucose, Bld: 173 mg/dL — ABNORMAL HIGH (ref 70–99)
Potassium: 4.6 mmol/L (ref 3.5–5.1)
Sodium: 136 mmol/L (ref 135–145)
Total Bilirubin: 0.7 mg/dL (ref 0.3–1.2)
Total Protein: 5.8 g/dL — ABNORMAL LOW (ref 6.5–8.1)

## 2019-05-12 LAB — CBC WITH DIFFERENTIAL (CANCER CENTER ONLY)
Abs Immature Granulocytes: 0.02 10*3/uL (ref 0.00–0.07)
Basophils Absolute: 0 10*3/uL (ref 0.0–0.1)
Basophils Relative: 1 %
Eosinophils Absolute: 0.1 10*3/uL (ref 0.0–0.5)
Eosinophils Relative: 2 %
HCT: 28.7 % — ABNORMAL LOW (ref 39.0–52.0)
Hemoglobin: 9.3 g/dL — ABNORMAL LOW (ref 13.0–17.0)
Immature Granulocytes: 0 %
Lymphocytes Relative: 16 %
Lymphs Abs: 1 10*3/uL (ref 0.7–4.0)
MCH: 31.5 pg (ref 26.0–34.0)
MCHC: 32.4 g/dL (ref 30.0–36.0)
MCV: 97.3 fL (ref 80.0–100.0)
Monocytes Absolute: 0.7 10*3/uL (ref 0.1–1.0)
Monocytes Relative: 12 %
Neutro Abs: 4.4 10*3/uL (ref 1.7–7.7)
Neutrophils Relative %: 69 %
Platelet Count: 340 10*3/uL (ref 150–400)
RBC: 2.95 MIL/uL — ABNORMAL LOW (ref 4.22–5.81)
RDW: 15.7 % — ABNORMAL HIGH (ref 11.5–15.5)
WBC Count: 6.3 10*3/uL (ref 4.0–10.5)
nRBC: 0 % (ref 0.0–0.2)

## 2019-05-12 MED ORDER — LIDOCAINE-PRILOCAINE 2.5-2.5 % EX CREA: TOPICAL_CREAM | CUTANEOUS | 1 refills | Status: DC

## 2019-05-12 MED ORDER — PROCHLORPERAZINE MALEATE 10 MG PO TABS: 10.0000 mg | ORAL_TABLET | Freq: Four times a day (QID) | ORAL | 0 refills | Status: DC | PRN

## 2019-05-12 NOTE — Progress Notes (Signed)
[facility] Telephone:([phone]) [phone]   Fax:([phone]) [facility] [address] [place] [address]  DIAGNOSIS: Stage IV (T1c, N3, M1 C) non-small cell lung cancer, squamous cell carcinoma presented with bilateral pulmonary nodules in addition to mediastinal lymphadenopathy and liver metastasis diagnosed in March 2021.  PRIOR THERAPY: None  CURRENT THERAPY: Systemic chemotherapy with carboplatin for AUC of 5, paclitaxel 175 mg/M2 and Keytruda 200 mg IV every 3 weeks.  First dose May 19, 2019.  INTERVAL HISTORY: Dennis Stephens 86 y.o. male returns to the clinic today for hospital follow-up visit accompanied by his wife.  The patient was seen during his hospitalization after he was diagnosed with non-small cell lung cancer.  He is feeling much better today except for fatigue and intermittent right upper quadrant abdominal pain after gallbladder stone extraction.  The patient denied having any current chest pain but has shortness of breath with exertion with mild cough and no hemoptysis.  He denied having any fever or chills.  He has no nausea, vomiting, diarrhea or constipation.  He denied having any headache or visual changes.  He has no recent weight loss or night sweats.  He came today for reevaluation and very interested and considering systemic therapy.   MEDICAL HISTORY: Past Medical History:  Diagnosis Date  . A-fib ([place])   . DM2 (diabetes mellitus, type 2) ([place])   . Hypertension     ALLERGIES:  has No Known Allergies.  MEDICATIONS:  Current Outpatient Medications  Medication Sig Dispense Refill  . amLODipine (NORVASC) 2.5 MG tablet Take 2.5 mg by mouth daily.    Marland Kitchen apixaban (ELIQUIS) 5 MG TABS tablet Take 1 tablet (5 mg total) by mouth 2 (two) times daily. 60 tablet 2  . atorvastatin (LIPITOR) 40 MG tablet Take 40 mg by mouth daily.    . carvedilol (COREG) 12.5 MG tablet Take 12.5 mg by mouth 2 (two) times daily  with a meal.    . doxazosin (CARDURA) 8 MG tablet Take 8 mg by mouth daily.    Marland Kitchen glimepiride (AMARYL) 2 MG tablet Take 1 tablet (2 mg total) by mouth daily with breakfast. 30 tablet 11  . loratadine (CLARITIN) 10 MG tablet Take 1 tablet (10 mg total) by mouth daily as needed for allergies or rhinitis. 30 tablet 4  . losartan (COZAAR) 100 MG tablet Take 100 mg by mouth daily.    Marland Kitchen omeprazole (PRILOSEC) 20 MG capsule Take 20 mg by mouth daily.    . sertraline (ZOLOFT) 100 MG tablet Take 100 mg by mouth daily.    . temazepam (RESTORIL) 30 MG capsule Take 30 mg by mouth at bedtime as needed for sleep.     No current facility-administered medications for this visit.    SURGICAL HISTORY:  Past Surgical History:  Procedure Laterality Date  . BRONCHIAL BIOPSY  04/28/2019   Procedure: BRONCHIAL BIOPSIES;  Surgeon: Garner Nash, DO;  Location: [facility];  Service: Pulmonary;;  . BRONCHIAL BRUSHINGS  04/28/2019   Procedure: BRONCHIAL BRUSHINGS;  Surgeon: Garner Nash, DO;  Location: [facility];  Service: Pulmonary;;  . BRONCHIAL NEEDLE ASPIRATION BIOPSY  04/28/2019   Procedure: BRONCHIAL NEEDLE ASPIRATION BIOPSIES;  Surgeon: Garner Nash, DO;  Location: [facility];  Service: Pulmonary;;  . BRONCHIAL WASHINGS  04/28/2019   Procedure: BRONCHIAL WASHINGS;  Surgeon: Garner Nash, DO;  Location: [facility];  Service: Pulmonary;;  . ENDOBRONCHIAL ULTRASOUND N/A 04/28/2019  Procedure: ENDOBRONCHIAL ULTRASOUND;  Surgeon: Garner Nash, DO;  Location: [facility];  Service: Pulmonary;  Laterality: N/A;  . ERCP N/A 04/27/2019   Procedure: ENDOSCOPIC RETROGRADE CHOLANGIOPANCREATOGRAPHY (ERCP);  Surgeon: Irene Shipper, MD;  Location: [facility];  Service: Endoscopy;  Laterality: N/A;  . ESOPHAGOGASTRODUODENOSCOPY (EGD) WITH PROPOFOL N/A 05/04/2019   Procedure: ESOPHAGOGASTRODUODENOSCOPY (EGD) WITH PROPOFOL;  Surgeon: Irene Shipper, MD;  Location: [facility];  Service: Endoscopy;   Laterality: N/A;  . LIPOMA EXCISION    . REMOVAL OF STONES  04/27/2019   Procedure: REMOVAL OF STONES;  Surgeon: Irene Shipper, MD;  Location: [facility];  Service: Endoscopy;;  . Joan Mayans  04/27/2019   Procedure: Joan Mayans;  Surgeon: Irene Shipper, MD;  Location: [facility];  Service: Endoscopy;;  . VIDEO BRONCHOSCOPY WITH ENDOBRONCHIAL NAVIGATION N/A 04/28/2019   Procedure: VIDEO BRONCHOSCOPY WITH ENDOBRONCHIAL NAVIGATION;  Surgeon: Garner Nash, DO;  Location: [facility];  Service: Pulmonary;  Laterality: N/A;    REVIEW OF SYSTEMS:  Constitutional: positive for fatigue Eyes: negative Ears, nose, mouth, throat, and face: negative Respiratory: positive for dyspnea on exertion Cardiovascular: negative Gastrointestinal: positive for abdominal pain Genitourinary:negative Integument/breast: negative Hematologic/lymphatic: negative Musculoskeletal:positive for arthralgias and muscle weakness Neurological: negative Behavioral/Psych: negative Endocrine: negative Allergic/Immunologic: negative   PHYSICAL EXAMINATION: General appearance: alert, cooperative, fatigued and no distress Head: Normocephalic, without obvious abnormality, atraumatic Neck: no adenopathy, no JVD, supple, symmetrical, trachea midline and thyroid not enlarged, symmetric, no tenderness/mass/nodules Lymph nodes: Cervical, supraclavicular, and axillary nodes normal. Resp: clear to auscultation bilaterally and normal percussion bilaterally Back: symmetric, no curvature. ROM normal. No CVA tenderness. Cardio: regular rate and rhythm, S1, S2 normal, no murmur, click, rub or gallop GI: soft, non-tender; bowel sounds normal; no masses,  no organomegaly Extremities: extremities normal, atraumatic, no cyanosis or edema Neurologic: Alert and oriented X 3, normal strength and tone. Normal symmetric reflexes. Normal coordination and gait  ECOG PERFORMANCE STATUS: 1 - Symptomatic but completely  ambulatory  Blood pressure (!) 115/53, pulse 90, resp. rate 17, height 5\' 9"  (1.753 m), weight 204 lb 4.8 oz (92.7 kg), SpO2 96 %.  LABORATORY DATA: Lab Results  Component Value Date   WBC 6.3 05/12/2019   HGB 9.3 (L) 05/12/2019   HCT 28.7 (L) 05/12/2019   MCV 97.3 05/12/2019   PLT 340 05/12/2019      Chemistry      Component Value Date/Time   NA 136 05/12/2019 0943   K 4.6 05/12/2019 0943   CL 105 05/12/2019 0943   CO2 21 (L) 05/12/2019 0943   BUN 10 05/12/2019 0943   CREATININE 0.98 05/12/2019 0943      Component Value Date/Time   CALCIUM 8.5 (L) 05/12/2019 0943   ALKPHOS 287 (H) 05/12/2019 0943   AST 39 05/12/2019 0943   ALT 51 (H) 05/12/2019 0943   BILITOT 0.7 05/12/2019 0943       RADIOGRAPHIC STUDIES: DG Chest 2 View  Result Date: 04/20/2019 CLINICAL DATA:  Chest pain for several hours, history of lung carcinoma EXAM: CHEST - 2 VIEW COMPARISON:  01/20/2019 CT FINDINGS: Cardiac shadow is within normal limits. There is a rounded nodular density identified in the left lung measuring 3.3 cm which corresponds to that seen on the prior exam at which time it measured 1.8 cm. Patchy changes are noted in the right upper lobe also increased when compared with the prior exam. No sizable effusion is noted. No bony abnormality is noted. IMPRESSION: Enlarging bilateral upper lobe nodules as  described. This is consistent with the patient's given clinical history Electronically Signed   By: Inez Catalina M.D.   On: 04/20/2019 21:56   CT Head Wo Contrast  Result Date: 04/21/2019 CLINICAL DATA:  86 year old male with pulmonary emboli, malignant appearing mediastinal lymphadenopathy and liver lesion on CT yesterday. Query brain Mets. EXAM: CT HEAD WITHOUT CONTRAST TECHNIQUE: Contiguous axial images were obtained from the base of the skull through the vertex without intravenous contrast. COMPARISON:  No prior head imaging. FINDINGS: Brain: IV contrast was administered for chest abdomen and  pelvis CT 1 hour before these images. Patchy bilateral white matter hypodensity, but no areas suspicious for vasogenic edema. No midline shift, mass effect, or evidence of intracranial mass lesion. No abnormal enhancement identified. No ventriculomegaly. No cortical encephalomalacia identified. No cortically based acute infarct identified. There is a small hypodensity in the posterior right cerebellum on series 2, image 7, probably a small chronic cerebellar infarct. Vascular: Some residual intravascular contrast is present as above. The major intracranial vascular structures seem to be enhancing as expected. Skull: No acute or suspicious osseous lesion identified. Sinuses/Orbits: Visualized paranasal sinuses and mastoids are clear. Other: No acute orbit or scalp soft tissue findings. IMPRESSION: 1. No metastatic disease to the brain is identified by CT with very delayed contrast timing. MRI Head without and with contrast would be most sensitive. 2. No acute intracranial abnormality identified. 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 Electronically Signed   By: Genevie Ann M.D.   On: 04/21/2019 01:08   CT Angio Chest PE W and/or Wo Contrast  Result Date: 04/21/2019 CLINICAL DATA:  Non-specific chest pain and abdominal pain since 1500 hours. History of hypertension and atrial fibrillation. EXAM: CT ANGIOGRAPHY CHEST CT ABDOMEN AND PELVIS WITH CONTRAST TECHNIQUE: Multidetector CT imaging of the chest was performed using the standard protocol during bolus administration of intravenous contrast. Multiplanar CT image reconstructions and MIPs were obtained to evaluate the vascular anatomy. Multidetector CT imaging of the abdomen and pelvis was performed using the standard protocol during bolus administration of intravenous contrast. Multidetector CT imaging of the chest was performed using the standard protocol during bolus administration of intravenous  contrast. Multiplanar CT image reconstructions and MIPs were obtained to evaluate the vascular anatomy. Automatic exposure control utilized. CONTRAST:  157mL OMNIPAQUE IOHEXOL 350 MG/ML SOLN COMPARISON:  January 20, 2019. FINDINGS: CTA CHEST FINDINGS Cardiovascular: Pulmonary emboli in the bilateral proximal lobar pulmonary arteries and more peripheral bilateral pulmonary arteries. No central pulmonary artery embolism. Right ventricular to left ventricular ratio 0.7; no right heart strain. Mild cardiomegaly with fatty hypertrophy of the inter atrial septum and four-vessel moderate to severe coronary calcification. Mediastinum/Nodes: Metastatic appearing right paratracheal conglomerate adenopathy measuring 3.2 by 2.4 cm. Metastatic-appearing superior mediastinal lymph node measuring 23 by 16 mm. Metastatic appearing right hilar lymph node measuring 16 by 22 mm. Additional smaller mediastinal and bilateral hilar lymph nodes that could be metastatic or benign. Small hiatal hernia. Lungs/Pleura: No pleural effusion. Bilateral spiculated upper lobe pulmonary nodules measuring 2.4 cm diameter in the left upper lobe on series 2, image 39, and 2.6 x 2.0 cm in the right upper lobe on image 26. Both nodules have increased in size is since December 2020 at which time they measured 19 x 18 mm on the left, and 8 mm diameter on the right. Respiratory motion artifact Upper Abdomen: Reported on concomitant CT abdomen and pelvis reported above. Musculoskeletal: Bone demineralization. Moderate  skeletal degenerative change. A 6 mm sclerotic lesion in the T12 vertebral body stable compared to December 2020. Review of the MIP images confirms the above findings. CT ABDOMEN and PELVIS FINDINGS Hepatobiliary: A new 14 mm hypoattenuating lesion in the right hemi liver, highly worrisome for metastasis. Chronic mild intrahepatic and 14 mm proximal common bile duct dilatation with normal distal tapering. Normal gallbladder. Pancreas: Normal.  Spleen: Normal. Adrenals/Urinary Tract: Normal bilateral adrenal glands. Bilateral medical renal disease and simple appearing renal cortical cysts measuring up to 4.4 cm on the right. No hydronephrosis or nephroureterolithiasis bilaterally. No apparent urinary bladder abnormality. A dense 10 mm right renal midpole hypoattenuation. Stomach/Bowel: No bowel obstruction. Moderate stool burden and diverticulosis coli without an acute diverticulitis or apparent bowel wall thickening. Normal appendix, axial image 60. Vascular/Lymphatic: Aortic bilateral iliac in major abdominal artery calcified atherosclerosis with probably clinically significant 75% narrowing of the left common iliac artery. No abdominal aortic aneurysm. Small nonspecific retrocrural, gastrohepatic ligament and retroperitoneal lymph nodes. A mildly enlarged periportal lymph node measuring 12 x 16 mm. No abdominal aortic dissection. Reproductive: Mild prostatomegaly. Other: None. Musculoskeletal: Moderate skeletal degenerative change. A 9 mm ovoid sclerotic lesion in the right proximal femoral shaft, unchanged. Review of the MIP images confirms the above findings. I discussed critical Value/emergent results by telephone at the time of interpretation on 04/21/2019 at 12:12 am with provider Providence Lanius , who verbally acknowledged these results. IMPRESSION: Acute bilateral lobar and peripheral pulmonary emboli without right heart strain. No thoracoabdominal aortic dissection. Suspicious bilateral upper lobe solid pulmonary nodules, both increased in size since December 2020. Histopathological correlation recommended. An embolic or infectious etiology are less likely differential considerations. Malignant-appearing mediastinal and right hilar adenopathy. Enlarged nonspecific gastrohepatic node. Additional small nonspecific lymph nodes above and below diaphragm which could be metastatic or benign. New hepatic lesion concerning for metastatic disease. Normal  bilateral adrenal glands. A new 10 mm dense right renal midpole lesion; renal ultrasonography recommended. A proteinaceous or hemorrhagic cortical cyst or malignant renal lesion are differential considerations. Mild cardiomegaly with four-vessel moderate to severe coronary calcification. Small hiatal hernia. Stable 6 mm T12 and 9 mm right proximal femoral shaft sclerotic lesions since December 2020. Thoracoabdominal aorta and major abdominal artery calcified atherosclerosis without aneurysm. Probably clinically significant 75% atherosclerotic narrowing of the right common iliac artery. Diverticulosis coli. Chronic biliary dilatation. Electronically Signed   By: Revonda Humphrey   On: 04/21/2019 00:17   CT ABDOMEN PELVIS W CONTRAST  Result Date: 05/02/2019 CLINICAL DATA:  86 year old male with history of nausea and vomiting. Suspected bowel obstruction or abdominal abscess/infection. EXAM: CT ABDOMEN AND PELVIS WITH CONTRAST TECHNIQUE: Multidetector CT imaging of the abdomen and pelvis was performed using the standard protocol following bolus administration of intravenous contrast. CONTRAST:  138mL OMNIPAQUE IOHEXOL 300 MG/ML  SOLN COMPARISON:  CT the abdomen and pelvis 04/20/2019. FINDINGS: Lower chest: Trace bilateral pleural effusions lying dependently. Atherosclerotic calcifications in the descending thoracic aorta as well as the right coronary artery. Mild fibrotic changes in the lung bases bilaterally, suggestive of nonspecific interstitial pneumonia. Hepatobiliary: Ill-defined hypovascular areas in the liver, best demonstrated by a 1.6 x 1.5 cm lesion in segment 4B (axial image 21 of series 2), similar to the recent prior examination. No intra or extrahepatic biliary ductal dilatation. Gallbladder is nearly completely decompressed, but the gallbladder wall appears thickened with avid enhancement of the gallbladder mucosa and mild diffuse gallbladder wall edema and subtle surrounding inflammatory changes.  Pancreas: No pancreatic mass. No  pancreatic ductal dilatation. No pancreatic or peripancreatic fluid collections or inflammatory changes. Spleen: Unremarkable. Adrenals/Urinary Tract: Low-attenuation lesions in the right kidney, compatible with simple cysts, largest of which is an exophytic lesion in the upper pole measuring 4.8 cm. Other subcentimeter low-attenuation lesions in the kidneys bilaterally, too small to characterize, but statistically likely to represent tiny cysts. Bilateral adrenal glands are normal in appearance. No hydroureteronephrosis. Urinary bladder is normal in appearance. Stomach/Bowel: Normal appearance of the stomach. No pathologic dilatation of small bowel or colon. Numerous colonic diverticulae are noted, particularly in the descending colon and sigmoid colon, without surrounding inflammatory changes to suggest an acute diverticulitis at this time. Normal appendix. Vascular/Lymphatic: Aortic atherosclerosis. 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 No lymphadenopathy noted in the abdomen or pelvis. No lymphadenopathy noted in the abdomen or pelvis. Reproductive: Prostate gland and seminal vesicles are unremarkable in appearance. Other: No significant volume of ascites.  No pneumoperitoneum. Musculoskeletal: There are no aggressive appearing lytic or blastic lesions noted in the visualized portions of the skeleton. IMPRESSION: 1. Although the gallbladder is nearly decompressed, and there are no definite calcified gallstones, the gallbladder wall appears thickened and edematous with hyperenhancement and subtle surrounding inflammatory changes. Clinical correlation for signs and symptoms of a calculus cholecystitis is suggested. 2. Ill-defined hepatic lesions again noted, the possibility of metastatic disease should be considered. This could be further evaluated with nonemergent abdominal MRI with and  without IV gadolinium if clinically appropriate. 3. Colonic diverticulosis without evidence of acute diverticulitis at this time. 4. 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 5. Trace bilateral pleural effusions lying dependently. 6. The appearance of the lung bases suggests interstitial lung disease such as nonspecific interstitial pneumonia (NSIP). Outpatient referral to Pulmonology should be considered in the near future to for further evaluation. 7. Additional incidental findings, as above. Electronically Signed   By: Vinnie Langton M.D.   On: 05/02/2019 18:44   CT ABDOMEN PELVIS W CONTRAST  Result Date: 04/21/2019 CLINICAL DATA:  Non-specific chest pain and abdominal pain since 1500 hours. History of hypertension and atrial fibrillation. EXAM: CT ANGIOGRAPHY CHEST CT ABDOMEN AND PELVIS WITH CONTRAST TECHNIQUE: Multidetector CT imaging of the chest was performed using the standard protocol during bolus administration of intravenous contrast. Multiplanar CT image reconstructions and MIPs were obtained to evaluate the vascular anatomy. Multidetector CT imaging of the abdomen and pelvis was performed using the standard protocol during bolus administration of intravenous contrast. Multidetector CT imaging of the chest was performed using the standard protocol during bolus administration of intravenous contrast. Multiplanar CT image reconstructions and MIPs were obtained to evaluate the vascular anatomy. Automatic exposure control utilized. CONTRAST:  115mL OMNIPAQUE IOHEXOL 350 MG/ML SOLN COMPARISON:  January 20, 2019. FINDINGS: CTA CHEST FINDINGS Cardiovascular: Pulmonary emboli in the bilateral proximal lobar pulmonary arteries and more peripheral bilateral pulmonary arteries. No central pulmonary artery embolism. Right ventricular to left ventricular ratio 0.7; no right heart strain. Mild cardiomegaly with fatty hypertrophy of the inter  atrial septum and four-vessel moderate to severe coronary calcification. Mediastinum/Nodes: Metastatic appearing right paratracheal conglomerate adenopathy measuring 3.2 by 2.4 cm. Metastatic-appearing superior mediastinal lymph node measuring 23 by 16 mm. Metastatic appearing right hilar lymph node measuring 16 by 22 mm. Additional smaller mediastinal and bilateral hilar lymph nodes that could be metastatic or benign. Small hiatal  hernia. Lungs/Pleura: No pleural effusion. Bilateral spiculated upper lobe pulmonary nodules measuring 2.4 cm diameter in the left upper lobe on series 2, image 39, and 2.6 x 2.0 cm in the right upper lobe on image 26. Both nodules have increased in size is since December 2020 at which time they measured 19 x 18 mm on the left, and 8 mm diameter on the right. Respiratory motion artifact Upper Abdomen: Reported on concomitant CT abdomen and pelvis reported above. Musculoskeletal: Bone demineralization. Moderate skeletal degenerative change. A 6 mm sclerotic lesion in the T12 vertebral body stable compared to December 2020. Review of the MIP images confirms the above findings. CT ABDOMEN and PELVIS FINDINGS Hepatobiliary: A new 14 mm hypoattenuating lesion in the right hemi liver, highly worrisome for metastasis. Chronic mild intrahepatic and 14 mm proximal common bile duct dilatation with normal distal tapering. Normal gallbladder. Pancreas: Normal. Spleen: Normal. Adrenals/Urinary Tract: Normal bilateral adrenal glands. Bilateral medical renal disease and simple appearing renal cortical cysts measuring up to 4.4 cm on the right. No hydronephrosis or nephroureterolithiasis bilaterally. No apparent urinary bladder abnormality. A dense 10 mm right renal midpole hypoattenuation. Stomach/Bowel: No bowel obstruction. Moderate stool burden and diverticulosis coli without an acute diverticulitis or apparent bowel wall thickening. Normal appendix, axial image 60. Vascular/Lymphatic: Aortic  bilateral iliac in major abdominal artery calcified atherosclerosis with probably clinically significant 75% narrowing of the left common iliac artery. No abdominal aortic aneurysm. Small nonspecific retrocrural, gastrohepatic ligament and retroperitoneal lymph nodes. A mildly enlarged periportal lymph node measuring 12 x 16 mm. No abdominal aortic dissection. Reproductive: Mild prostatomegaly. Other: None. Musculoskeletal: Moderate skeletal degenerative change. A 9 mm ovoid sclerotic lesion in the right proximal femoral shaft, unchanged. Review of the MIP images confirms the above findings. I discussed critical Value/emergent results by telephone at the time of interpretation on 04/21/2019 at 12:12 am with provider Providence Lanius , who verbally acknowledged these results. IMPRESSION: Acute bilateral lobar and peripheral pulmonary emboli without right heart strain. No thoracoabdominal aortic dissection. Suspicious bilateral upper lobe solid pulmonary nodules, both increased in size since December 2020. Histopathological correlation recommended. An embolic or infectious etiology are less likely differential considerations. Malignant-appearing mediastinal and right hilar adenopathy. Enlarged nonspecific gastrohepatic node. Additional small nonspecific lymph nodes above and below diaphragm which could be metastatic or benign. New hepatic lesion concerning for metastatic disease. Normal bilateral adrenal glands. A new 10 mm dense right renal midpole lesion; renal ultrasonography recommended. A proteinaceous or hemorrhagic cortical cyst or malignant renal lesion are differential considerations. Mild cardiomegaly with four-vessel moderate to severe coronary calcification. Small hiatal hernia. Stable 6 mm T12 and 9 mm right proximal femoral shaft sclerotic lesions since December 2020. Thoracoabdominal aorta and major abdominal artery calcified atherosclerosis without aneurysm. Probably clinically significant 75%  atherosclerotic narrowing of the right common iliac artery. Diverticulosis coli. Chronic biliary dilatation. Electronically Signed   By: Revonda Humphrey   On: 04/21/2019 00:17   MR 3D Recon At Scanner  Result Date: 04/26/2019 CLINICAL DATA:  Evaluate for choledocholithiasis. EXAM: MRI ABDOMEN WITHOUT AND WITH CONTRAST (INCLUDING MRCP) TECHNIQUE: Multiplanar multisequence MR imaging of the abdomen was performed both before and after the administration of intravenous contrast. Heavily T2-weighted images of the biliary and pancreatic ducts were obtained, and three-dimensional MRCP images were rendered by post processing. CONTRAST:  42mL GADAVIST GADOBUTROL 1 MMOL/ML IV SOLN COMPARISON:  CT AP 04/20/2019 FINDINGS: Lower chest: Trace bilateral pleural effusions. Hepatobiliary: Motion artifact diminishes exam detail within the liver. Corresponding to  the CT abnormality is a focal area of increased T2 signal with restricted diffusion within segment 8/4. This measures 1.5 cm. A second more subtle area of increased T2 signal is noted within segment 7, image 12/3 and image 57/5. Enhancement characteristics of both of these lesions are difficult to assess due motion artifact. There is mild diffuse gallbladder wall thickening. Sludge is identified within the neck of the gallbladder. The common bile duct measures 1.1 cm in diameter. At least 4 stones are identified within the distal common bile duct which measure up to 7 mm, image 18/8. Pancreas: Mild diffuse edema is identified. No main duct dilatation or mass identified. No pancreatic necrosis or pseudocyst formation. Spleen:  Within normal limits in size and appearance. Adrenals/Urinary Tract:  Normal appearance of the adrenal glands. No hydronephrosis identified bilaterally. Bilateral perinephric fat stranding is identified, nonspecific in may be related to prior insult. Several T2 hyperintense kidney lesions are identified. The largest arises from the posterior cortex of  the upper pole of left kidney measuring 4.5 cm and is compatible with a simple cyst. No solid enhancing mass or hydronephrosis identified bilaterally. No abnormal signal in or enhancement noted in the region of the recently characterized 10 mm right mid pole lesion. Stomach/Bowel: Visualized portions within the abdomen are unremarkable. Vascular/Lymphatic: Aortic atherosclerosis. No aneurysm. The portal vein remains patent. The splenic vein is also patent. No adenopathy Other:  No free fluid or fluid collections. Musculoskeletal: No suspicious bone lesions identified. IMPRESSION: 1. Exam is positive for choledocholithiasis. At least 4 stones are noted within the distal measuring up to 7 mm. There is increase caliber of the CBD which measures 1.1 cm in maximum dimension. 2. Gallbladder sludge and mild diffuse gallbladder wall thickening. Cannot rule out cholecystitis 3. Two areas of abnormal increased T2 signal are identified within segment 8/4 and segment 7. The enhancement characteristics of these lesions are difficult to assess due to abnormal motion artifact on the postcontrast images. As mentioned on exam from 04/20/2019 these are new when compared with 01/20/2019. Metastatic disease cannot be excluded. 4. Mild diffuse pancreatic edema compatible with uncomplicated acute pancreatitis. No pancreatic necrosis, main duct dilatation or pseudocyst identified at this time. 5. No MRI correlate to the suspicious right kidney lesion identified on recent CT. This remains indeterminate and may not be seen due to motion artifact on today's study. Recommend follow-up imaging after resolution of this acute episode. Ideally this should be performed as an outpatient when the patient is able to lay motionless and breath hold. 6. These results will be called to the ordering clinician or representative by the Radiologist Assistant, and communication documented in the PACS or [employer]. Electronically Signed   By: Kerby Moors M.D.   On: 04/26/2019 19:40   DG CHEST PORT 1 VIEW  Result Date: 04/28/2019 CLINICAL DATA:  Postop bronchoscopy. EXAM: PORTABLE CHEST 1 VIEW COMPARISON:  Chest radiograph and CT 04/20/2019 FINDINGS: No visualized pneumothorax post biopsy. Nodule in the left mid lung again seen. Nodular opacities in the right suprahilar region correspond to nodules on CT. Mild cardiomegaly. Unchanged mediastinal contours with aortic atherosclerosis. No significant pleural effusion. There is mild peribronchial thickening. IMPRESSION: 1. No evidence of pneumothorax post biopsy. 2. Left midlung nodule and right suprahilar nodules, as seen on recent CT. Aortic Atherosclerosis (ICD10-I70.0). Electronically Signed   By: Keith Rake M.D.   On: 04/28/2019 17:19   DG Chest Port 1 View  Result Date: 04/25/2019 CLINICAL DATA:  Chest pain and  shortness of breath. EXAM: PORTABLE CHEST 1 VIEW COMPARISON:  Chest plain film and chest CT, dated April 20, 2019, are available for comparison. FINDINGS: A stable patchy area of increased opacification is again seen within the mid left lung. A stable similar-appearing area is seen overlying the upper right lung. There is no evidence of a pleural effusion or pneumothorax. The heart size and mediastinal contours are within normal limits. The visualized skeletal structures are unremarkable. IMPRESSION: Stable patchy opacities within the mid left lung and right upper lobe, consistent with the bilateral lung masses seen within these regions on the prior chest CT dated April 20, 2019. Electronically Signed   By: Virgina Norfolk M.D.   On: 04/25/2019 18:09   DG ERCP BILIARY & PANCREATIC DUCTS  Result Date: 04/27/2019 CLINICAL DATA:  ERCP with sphincterotomy and balloon extraction. EXAM: ERCP TECHNIQUE: Multiple spot images obtained with the fluoroscopic device and submitted for interpretation post-procedure. COMPARISON:  MRCP - 04/26/2019 FLUOROSCOPY TIME:  4 minutes, 28 seconds FINDINGS:  Twenty spot intraoperative fluoroscopic images of the right upper abdominal quadrant during ERCP are provided for review Initial image demonstrates an ERCP probe overlying the right upper abdominal quadrant Subsequent images demonstrate selective cannulation and opacification of the common bile duct which appears moderately dilated. There are 4 nonocclusive filling defects within the distal aspect of the CBD (image 4) compatible with choledocholithiasis demonstrated on preceding MRCP. Subsequent images demonstrate insufflation of a balloon within the central/mid aspect of the CBD with subsequent biliary sweeping and presumed sphincterotomy. There is minimal opacification of the intrahepatic biliary tree which appears nondilated. There is no definitive opacification of either the cystic or pancreatic ducts. IMPRESSION: ERCP with findings of choledocholithiasis with subsequent biliary sweeping and sphincterotomy. These images were submitted for radiologic interpretation only. Please see the procedural report for the amount of contrast and the fluoroscopy time utilized. Electronically Signed   By: Sandi Mariscal M.D.   On: 04/27/2019 11:32   DG Abd Portable 1V  Result Date: 05/01/2019 CLINICAL DATA:  Assess for constipation EXAM: PORTABLE ABDOMEN - 1 VIEW COMPARISON:  None. FINDINGS: A few mildly prominent loops of small bowel are seen in the left abdomen measuring up to 3 cm. Moderate fecal loading is seen throughout the colon. No other abnormalities are identified. IMPRESSION: Mildly prominent loops of small bowel in the left abdomen could represent mild ileus or early obstruction in the appropriate clinical setting. Moderate fecal loading in the colon. Electronically Signed   By: Dorise Bullion III M.D   On: 05/01/2019 12:20   MR ABDOMEN MRCP W WO CONTAST  Result Date: 04/26/2019 CLINICAL DATA:  Evaluate for choledocholithiasis. EXAM: MRI ABDOMEN WITHOUT AND WITH CONTRAST (INCLUDING MRCP) TECHNIQUE:  Multiplanar multisequence MR imaging of the abdomen was performed both before and after the administration of intravenous contrast. Heavily T2-weighted images of the biliary and pancreatic ducts were obtained, and three-dimensional MRCP images were rendered by post processing. CONTRAST:  63mL GADAVIST GADOBUTROL 1 MMOL/ML IV SOLN COMPARISON:  CT AP 04/20/2019 FINDINGS: Lower chest: Trace bilateral pleural effusions. Hepatobiliary: Motion artifact diminishes exam detail within the liver. Corresponding to the CT abnormality is a focal area of increased T2 signal with restricted diffusion within segment 8/4. This measures 1.5 cm. A second more subtle area of increased T2 signal is noted within segment 7, image 12/3 and image 57/5. Enhancement characteristics of both of these lesions are difficult to assess due motion artifact. There is mild diffuse gallbladder wall thickening. Sludge is identified  within the neck of the gallbladder. The common bile duct measures 1.1 cm in diameter. At least 4 stones are identified within the distal common bile duct which measure up to 7 mm, image 18/8. Pancreas: Mild diffuse edema is identified. No main duct dilatation or mass identified. No pancreatic necrosis or pseudocyst formation. Spleen:  Within normal limits in size and appearance. Adrenals/Urinary Tract:  Normal appearance of the adrenal glands. No hydronephrosis identified bilaterally. Bilateral perinephric fat stranding is identified, nonspecific in may be related to prior insult. Several T2 hyperintense kidney lesions are identified. The largest arises from the posterior cortex of the upper pole of left kidney measuring 4.5 cm and is compatible with a simple cyst. No solid enhancing mass or hydronephrosis identified bilaterally. No abnormal signal in or enhancement noted in the region of the recently characterized 10 mm right mid pole lesion. Stomach/Bowel: Visualized portions within the abdomen are unremarkable.  Vascular/Lymphatic: Aortic atherosclerosis. No aneurysm. The portal vein remains patent. The splenic vein is also patent. No adenopathy Other:  No free fluid or fluid collections. Musculoskeletal: No suspicious bone lesions identified. IMPRESSION: 1. Exam is positive for choledocholithiasis. At least 4 stones are noted within the distal measuring up to 7 mm. There is increase caliber of the CBD which measures 1.1 cm in maximum dimension. 2. Gallbladder sludge and mild diffuse gallbladder wall thickening. Cannot rule out cholecystitis 3. Two areas of abnormal increased T2 signal are identified within segment 8/4 and segment 7. The enhancement characteristics of these lesions are difficult to assess due to abnormal motion artifact on the postcontrast images. As mentioned on exam from 04/20/2019 these are new when compared with 01/20/2019. Metastatic disease cannot be excluded. 4. Mild diffuse pancreatic edema compatible with uncomplicated acute pancreatitis. No pancreatic necrosis, main duct dilatation or pseudocyst identified at this time. 5. No MRI correlate to the suspicious right kidney lesion identified on recent CT. This remains indeterminate and may not be seen due to motion artifact on today's study. Recommend follow-up imaging after resolution of this acute episode. Ideally this should be performed as an outpatient when the patient is able to lay motionless and breath hold. 6. These results will be called to the ordering clinician or representative by the Radiologist Assistant, and communication documented in the PACS or [employer]. Electronically Signed   By: Kerby Moors M.D.   On: 04/26/2019 19:40   ECHOCARDIOGRAM COMPLETE  Result Date: 04/21/2019    ECHOCARDIOGRAM REPORT   Patient Name:   GEAROLD WAINER Date of Exam: 04/21/2019 Medical Rec #:  [id number]       Height:       69.0 in Accession #:    [id number]      Weight:       213.0 lb Date of Birth:  1933/11/21       BSA:          2.122 m  Patient Age:    61 years        BP:           159/76 mmHg Patient Gender: M               HR:           98 bpm. Exam Location:  Inpatient Procedure: 2D Echo, Cardiac Doppler, Color Doppler and Intracardiac            Opacification Agent Indications:    Pulmonary embolus 415.19  History:        Patient has no  prior history of Echocardiogram examinations.                 Risk Factors:Hypertension and Former Smoker. Pulmonary embolus.  Sonographer:    Vickie Epley RDCS Referring Phys: [place]  Sonographer Comments: Technically difficult study due to poor echo windows. IMPRESSIONS  1. Left ventricular ejection fraction, by estimation, is 55 to 60%. The left ventricle has normal function. The left ventricle has no regional wall motion abnormalities. Left ventricular diastolic parameters are consistent with Grade I diastolic dysfunction (impaired relaxation).  2. RV not well visualized. Appears to have low normal function. There is ventricular septal dyssynchrony - may be due to bundle branch block. Negative McConnell's sign.. Right ventricular systolic function is low normal. The right ventricular size is normal.  3. The mitral valve is grossly normal. Trivial mitral valve regurgitation.  4. The aortic valve is tricuspid. Aortic valve regurgitation is not visualized.  5. The inferior vena cava is dilated in size with >50% respiratory variability, suggesting right atrial pressure of 8 mmHg. FINDINGS  Left Ventricle: Left ventricular ejection fraction, by estimation, is 55 to 60%. The left ventricle has normal function. The left ventricle has no regional wall motion abnormalities. Definity contrast agent was given IV to delineate the left ventricular  endocardial borders. The left ventricular internal cavity size was normal in size. There is no left ventricular hypertrophy. Abnormal (paradoxical) septal motion, consistent with left bundle branch block. Left ventricular diastolic parameters are consistent  with Grade I diastolic dysfunction (impaired relaxation). Indeterminate filling pressures. Right Ventricle: RV not well visualized. Appears to have low normal function. There is ventricular septal dyssynchrony - may be due to bundle branch block. Negative McConnell's sign. The right ventricular size is normal. No increase in right ventricular wall thickness. Right ventricular systolic function is low normal. Left Atrium: Left atrial size was normal in size. Right Atrium: Right atrial size was normal in size. Pericardium: Trivial pericardial effusion is present. The pericardial effusion is localized near the right ventricle. Mitral Valve: The mitral valve is grossly normal. Trivial mitral valve regurgitation. Tricuspid Valve: The tricuspid valve is not well visualized. Tricuspid valve regurgitation is not demonstrated. Aortic Valve: The aortic valve is tricuspid. Aortic valve regurgitation is not visualized. Pulmonic Valve: The pulmonic valve was grossly normal. Pulmonic valve regurgitation is not visualized. Aorta: The aortic root, ascending aorta, aortic arch and descending aorta are all structurally normal, with no evidence of dilitation or obstruction. Venous: The inferior vena cava is dilated in size with greater than 50% respiratory variability, suggesting right atrial pressure of 8 mmHg. IAS/Shunts: No atrial level shunt detected by color flow Doppler.  LEFT VENTRICLE PLAX 2D LVIDd:         5.30 cm  Diastology LVIDs:         3.70 cm  LV e' lateral:   7.40 cm/s LV PW:         1.00 cm  LV E/e' lateral: 10.5 LV IVS:        1.00 cm  LV e' medial:    5.98 cm/s LVOT diam:     2.10 cm  LV E/e' medial:  13.0 LV SV:         67 LV SV Index:   32 LVOT Area:     3.46 cm  RIGHT VENTRICLE RV S prime:     10.60 cm/s TAPSE (M-mode): 1.9 cm LEFT ATRIUM             Index  LA diam:        3.80 cm 1.79 cm/m LA Vol (A2C):   42.8 ml 20.17 ml/m LA Vol (A4C):   42.4 ml 19.98 ml/m LA Biplane Vol: 44.3 ml 20.88 ml/m  AORTIC VALVE  LVOT Vmax:   107.00 cm/s LVOT Vmean:  81.100 cm/s LVOT VTI:    0.194 m  AORTA Ao Root diam: 3.50 cm MITRAL VALVE MV Area (PHT): 4.63 cm     SHUNTS MV Decel Time: 164 msec     Systemic VTI:  0.19 m MV E velocity: 77.70 cm/s   Systemic Diam: 2.10 cm MV A velocity: 108.00 cm/s MV E/A ratio:  0.72 Lyman Bishop MD Electronically signed by Lyman Bishop MD Signature Date/Time: 04/21/2019/10:53:47 AM    Final    VAS [place] LOWER EXTREMITY VENOUS (DVT)  Result Date: 04/21/2019  Lower Venous DVTStudy Indications: Pulmonary embolism.  Comparison Study: no prior Performing Technologist: [name] RVS  Examination Guidelines: A complete evaluation includes B-mode imaging, spectral Doppler, color Doppler, and power Doppler as needed of all accessible portions of each vessel. Bilateral testing is considered an integral part of a complete examination. Limited examinations for reoccurring indications may be performed as noted. The reflux portion of the exam is performed with the patient in reverse Trendelenburg.  +---------+---------------+---------+-----------+----------+--------------+ RIGHT    CompressibilityPhasicitySpontaneityPropertiesThrombus Aging +---------+---------------+---------+-----------+----------+--------------+ CFV      Full           Yes      Yes                                 +---------+---------------+---------+-----------+----------+--------------+ SFJ      Full                                                        +---------+---------------+---------+-----------+----------+--------------+ FV Prox  Full                                                        +---------+---------------+---------+-----------+----------+--------------+ FV Mid   Full                                                        +---------+---------------+---------+-----------+----------+--------------+ FV DistalFull                                                         +---------+---------------+---------+-----------+----------+--------------+ PFV      Full                                                        +---------+---------------+---------+-----------+----------+--------------+ POP      Full  Yes      Yes                                 +---------+---------------+---------+-----------+----------+--------------+ PTV      Full                                                        +---------+---------------+---------+-----------+----------+--------------+ PERO                                                  Not visualized +---------+---------------+---------+-----------+----------+--------------+   +---------+---------------+---------+-----------+----------+--------------+ LEFT     CompressibilityPhasicitySpontaneityPropertiesThrombus Aging +---------+---------------+---------+-----------+----------+--------------+ CFV      Full           Yes      Yes                                 +---------+---------------+---------+-----------+----------+--------------+ SFJ      Full                                                        +---------+---------------+---------+-----------+----------+--------------+ FV Prox  Full                                                        +---------+---------------+---------+-----------+----------+--------------+ FV Mid   Full                                                        +---------+---------------+---------+-----------+----------+--------------+ FV DistalFull                                                        +---------+---------------+---------+-----------+----------+--------------+ PFV      Full                                                        +---------+---------------+---------+-----------+----------+--------------+ POP      Full           Yes      Yes                                  +---------+---------------+---------+-----------+----------+--------------+ PTV  Not visualized +---------+---------------+---------+-----------+----------+--------------+ PERO                                                  Not visualized +---------+---------------+---------+-----------+----------+--------------+     Summary: BILATERAL: - No evidence of deep vein thrombosis seen in the lower extremities, bilaterally.   *See table(s) above for measurements and observations. Electronically signed by Curt Jews MD on 04/21/2019 at 2:50:03 PM.    Final    US Abdomen Limited RUQ  Result Date: 04/26/2019 CLINICAL DATA:  Pancreatitis.  Hypertension.  Diabetes. EXAM: ULTRASOUND ABDOMEN LIMITED RIGHT UPPER QUADRANT COMPARISON:  04/20/2019 abdominopelvic CT FINDINGS: Gallbladder: No gallstones or wall thickening visualized. No sonographic Murphy sign noted by sonographer. Common bile duct: Diameter: Dilated, including at 1.3 cm. compared to the recent CT, common duct stone or stones are present distally. Liver: Central right hepatic lobe hypoechoic lesion of 2.0 cm, as on CT. Portal vein is patent on color Doppler imaging with normal direction of blood flow towards the liver. Other: No ascites. IMPRESSION: 1. Normal gallbladder. 2. Biliary duct dilatation with choledocholithiasis. Consider further evaluation with ERCP. 3. Right hepatic lobe lesion as on CT. These results will be called to the ordering clinician or representative by the Radiologist Assistant, and communication documented in the PACS or [employer]. Electronically Signed   By: Abigail Miyamoto M.D.   On: 04/26/2019 07:16   DG C-ARM BRONCHOSCOPY  Result Date: 04/28/2019 C-ARM BRONCHOSCOPY: Fluoroscopy was utilized by the requesting physician.  No radiographic interpretation.    ASSESSMENT AND PLAN: This is a very pleasant 86 years old white male recently diagnosed with a stage IV (T1c,  N3, M1 C) non-small cell lung cancer, squamous cell carcinoma presented with bilateral pulmonary nodules in addition to bilateral mediastinal lymphadenopathy and suspicious liver metastasis in March 2021. I had a lengthy discussion with the patient and his wife today about his current disease stage, prognosis and treatment options.  I explained to the patient that he has incurable condition and all the treatment will be of palliative nature. I gave the patient the option of palliative care and hospice referral versus consideration of palliative systemic chemotherapy with carboplatin for AUC of 5, paclitaxel 175 mg/M2 and Keytruda 200 mg IV every 3 weeks with Neulasta support. The patient is interested in systemic chemotherapy.  I discussed with him the adverse effect of this treatment including but not limited to alopecia, myelosuppression, nausea and vomiting, peripheral neuropathy, liver or renal dysfunction as well as the immunotherapy adverse effects. He is expected to start the first cycle of this treatment next week. I will arrange for the patient to have a chemotherapy education class before the first dose of his treatment. I will complete the staging work-up by ordering a PET scan. I will also refer the patient to interventional audiology for consideration of Port-A-Cath placement before his treatment. I will call his pharmacy with prescription for Compazine 10 mg p.o. every 6 hours as needed for nausea in addition to EMLA cream. The patient will come back for follow-up visit in 2 weeks for evaluation and management of any adverse effect of his treatment. He was advised to call immediately if he has any concerning symptoms in the interval. The patient voices understanding of current disease status and treatment options and is in agreement with the current care plan.  All questions were  answered. The patient knows to call the clinic with any problems, questions or concerns. We can certainly see  the patient much sooner if necessary. The total time spent in the appointment was 45 minutes.  Disclaimer: This note was dictated with voice recognition software. Similar sounding words can inadvertently be transcribed and may not be corrected upon review.

## 2019-05-12 NOTE — Progress Notes (Signed)
START ON PATHWAY REGIMEN - Non-Small Cell Lung     A cycle is every 21 days:     Pembrolizumab      Paclitaxel      Carboplatin   **Always confirm dose/schedule in your pharmacy ordering system**  Patient Characteristics: Stage IV Metastatic, Squamous, PS = 0, 1, First Line, PD-L1 Expression Positive 1-49% (TPS) / Negative / Not Tested / Awaiting Test Results and Immunotherapy Candidate Therapeutic Status: Stage IV Metastatic Histology: Squamous Cell Line of therapy: First Line ECOG Performance Status: 1 PD-L1 Expression Status: Quantity Not Sufficient Immunotherapy Candidate Status: Candidate for Immunotherapy Intent of Therapy: Non-Curative / Palliative Intent, Discussed with Patient

## 2019-05-14 ENCOUNTER — Telehealth: Payer: Self-pay | Admitting: Internal Medicine

## 2019-05-14 NOTE — Telephone Encounter (Signed)
Scheduled per los. Called and spoke with patient. Confirmed appts  

## 2019-05-17 NOTE — Progress Notes (Signed)
Pharmacist Chemotherapy Monitoring - Initial Assessment    Anticipated start date: 05/19/2019   Regimen:  . Are orders appropriate based on the patient's diagnosis, regimen, and cycle? Yes . Does the plan date match the patient's scheduled date? Yes . Is the sequencing of drugs appropriate? Yes . Are the premedications appropriate for the patient's regimen? Yes . Prior Authorization for treatment is: Approved o If applicable, is the correct biosimilar selected based on the patient's insurance? yes  Organ Function and Labs: Marland Kitchen Are dose adjustments needed based on the patient's renal function, hepatic function, or hematologic function? No . Are appropriate labs ordered prior to the start of patient's treatment? Yes . Other organ system assessment, if indicated: N/A . The following baseline labs, if indicated, have been ordered: pembrolizumab: baseline TSH +/- T4  Dose Assessment: . Are the drug doses appropriate? Yes . Are the following correct: o Drug concentrations Yes o IV fluid compatible with drug Yes o Administration routes Yes o Timing of therapy Yes . If applicable, does the patient have documented access for treatment and/or plans for port-a-cath placement? not applicable . If applicable, have lifetime cumulative doses been properly documented and assessed? yes Lifetime Dose Tracking  No doses have been documented on this patient for the following tracked chemicals: Doxorubicin, Epirubicin, Idarubicin, Daunorubicin, Mitoxantrone, Bleomycin, Oxaliplatin, Carboplatin, Liposomal Doxorubicin  o   Toxicity Monitoring/Prevention: . The patient has the following take home antiemetics prescribed: Prochlorperazine . The patient has the following take home medications prescribed: N/A . Medication allergies and previous infusion related reactions, if applicable, have been reviewed and addressed. Yes . The patient's current medication list has been assessed for drug-drug interactions with  their chemotherapy regimen. no significant drug-drug interactions were identified on review.  Order Review: . Are the treatment plan orders signed? Yes . Is the patient scheduled to see a provider prior to their treatment? No  I verify that I have reviewed each item in the above checklist and answered each question accordingly.  [facility] Levo [facility] 05/17/2019 10:58 AM

## 2019-05-18 ENCOUNTER — Inpatient Hospital Stay: Payer: No Typology Code available for payment source | Attending: Internal Medicine

## 2019-05-18 ENCOUNTER — Other Ambulatory Visit: Payer: Self-pay

## 2019-05-18 ENCOUNTER — Other Ambulatory Visit: Payer: Self-pay | Admitting: *Deleted

## 2019-05-18 DIAGNOSIS — C787 Secondary malignant neoplasm of liver and intrahepatic bile duct: Secondary | ICD-10-CM | POA: Insufficient documentation

## 2019-05-18 DIAGNOSIS — I700 Atherosclerosis of aorta: Secondary | ICD-10-CM | POA: Insufficient documentation

## 2019-05-18 DIAGNOSIS — I4891 Unspecified atrial fibrillation: Secondary | ICD-10-CM | POA: Insufficient documentation

## 2019-05-18 DIAGNOSIS — Z5111 Encounter for antineoplastic chemotherapy: Secondary | ICD-10-CM | POA: Insufficient documentation

## 2019-05-18 DIAGNOSIS — Z79899 Other long term (current) drug therapy: Secondary | ICD-10-CM | POA: Insufficient documentation

## 2019-05-18 DIAGNOSIS — E119 Type 2 diabetes mellitus without complications: Secondary | ICD-10-CM | POA: Insufficient documentation

## 2019-05-18 DIAGNOSIS — R590 Localized enlarged lymph nodes: Secondary | ICD-10-CM | POA: Insufficient documentation

## 2019-05-18 DIAGNOSIS — K8689 Other specified diseases of pancreas: Secondary | ICD-10-CM | POA: Insufficient documentation

## 2019-05-18 DIAGNOSIS — J90 Pleural effusion, not elsewhere classified: Secondary | ICD-10-CM | POA: Insufficient documentation

## 2019-05-18 DIAGNOSIS — J439 Emphysema, unspecified: Secondary | ICD-10-CM | POA: Insufficient documentation

## 2019-05-18 DIAGNOSIS — C3491 Malignant neoplasm of unspecified part of right bronchus or lung: Secondary | ICD-10-CM | POA: Insufficient documentation

## 2019-05-18 DIAGNOSIS — I10 Essential (primary) hypertension: Secondary | ICD-10-CM | POA: Insufficient documentation

## 2019-05-18 DIAGNOSIS — Z7901 Long term (current) use of anticoagulants: Secondary | ICD-10-CM | POA: Insufficient documentation

## 2019-05-18 DIAGNOSIS — Z5112 Encounter for antineoplastic immunotherapy: Secondary | ICD-10-CM | POA: Insufficient documentation

## 2019-05-18 MED ORDER — PROCHLORPERAZINE MALEATE 10 MG PO TABS: 10.0000 mg | ORAL_TABLET | Freq: Four times a day (QID) | ORAL | 0 refills | Status: DC | PRN

## 2019-05-18 MED ORDER — LIDOCAINE-PRILOCAINE 2.5-2.5 % EX CREA: TOPICAL_CREAM | CUTANEOUS | 1 refills | Status: AC

## 2019-05-19 ENCOUNTER — Inpatient Hospital Stay: Payer: No Typology Code available for payment source

## 2019-05-19 VITALS — BP 158/68 | HR 84 | Temp 98.1°F | Resp 18

## 2019-05-19 DIAGNOSIS — Z79899 Other long term (current) drug therapy: Secondary | ICD-10-CM | POA: Diagnosis not present

## 2019-05-19 DIAGNOSIS — C3491 Malignant neoplasm of unspecified part of right bronchus or lung: Secondary | ICD-10-CM

## 2019-05-19 DIAGNOSIS — Z5111 Encounter for antineoplastic chemotherapy: Secondary | ICD-10-CM | POA: Diagnosis present

## 2019-05-19 DIAGNOSIS — J90 Pleural effusion, not elsewhere classified: Secondary | ICD-10-CM | POA: Diagnosis not present

## 2019-05-19 DIAGNOSIS — Z5112 Encounter for antineoplastic immunotherapy: Secondary | ICD-10-CM | POA: Diagnosis not present

## 2019-05-19 DIAGNOSIS — J439 Emphysema, unspecified: Secondary | ICD-10-CM | POA: Diagnosis not present

## 2019-05-19 DIAGNOSIS — E119 Type 2 diabetes mellitus without complications: Secondary | ICD-10-CM | POA: Diagnosis not present

## 2019-05-19 DIAGNOSIS — R590 Localized enlarged lymph nodes: Secondary | ICD-10-CM | POA: Diagnosis not present

## 2019-05-19 DIAGNOSIS — K8689 Other specified diseases of pancreas: Secondary | ICD-10-CM | POA: Diagnosis not present

## 2019-05-19 DIAGNOSIS — Z7901 Long term (current) use of anticoagulants: Secondary | ICD-10-CM | POA: Diagnosis not present

## 2019-05-19 DIAGNOSIS — I700 Atherosclerosis of aorta: Secondary | ICD-10-CM | POA: Diagnosis not present

## 2019-05-19 DIAGNOSIS — C787 Secondary malignant neoplasm of liver and intrahepatic bile duct: Secondary | ICD-10-CM | POA: Diagnosis not present

## 2019-05-19 DIAGNOSIS — I4891 Unspecified atrial fibrillation: Secondary | ICD-10-CM | POA: Diagnosis not present

## 2019-05-19 DIAGNOSIS — I10 Essential (primary) hypertension: Secondary | ICD-10-CM | POA: Diagnosis not present

## 2019-05-19 LAB — COMPREHENSIVE METABOLIC PANEL
ALT: 27 U/L (ref 0–44)
AST: 25 U/L (ref 15–41)
Albumin: 2.8 g/dL — ABNORMAL LOW (ref 3.5–5.0)
Alkaline Phosphatase: 215 U/L — ABNORMAL HIGH (ref 38–126)
Anion gap: 12 (ref 5–15)
BUN: 12 mg/dL (ref 8–23)
CO2: 18 mmol/L — ABNORMAL LOW (ref 22–32)
Calcium: 8.6 mg/dL — ABNORMAL LOW (ref 8.9–10.3)
Chloride: 109 mmol/L (ref 98–111)
Creatinine, Ser: 0.94 mg/dL (ref 0.61–1.24)
GFR calc Af Amer: >60 mL/min (ref >60)
GFR calc non Af Amer: >60 mL/min (ref >60)
Glucose, Bld: 221 mg/dL — ABNORMAL HIGH (ref 70–99)
Potassium: 4.4 mmol/L (ref 3.5–5.1)
Sodium: 139 mmol/L (ref 135–145)
Total Bilirubin: 0.6 mg/dL (ref 0.3–1.2)
Total Protein: 6.2 g/dL — ABNORMAL LOW (ref 6.5–8.1)

## 2019-05-19 LAB — CBC WITH DIFFERENTIAL/PLATELET
Abs Immature Granulocytes: 0.02 10*3/uL (ref 0.00–0.07)
Basophils Absolute: 0 10*3/uL (ref 0.0–0.1)
Basophils Relative: 1 %
Eosinophils Absolute: 0.2 10*3/uL (ref 0.0–0.5)
Eosinophils Relative: 5 %
HCT: 32.6 % — ABNORMAL LOW (ref 39.0–52.0)
Hemoglobin: 10.5 g/dL — ABNORMAL LOW (ref 13.0–17.0)
Immature Granulocytes: 0 %
Lymphocytes Relative: 22 %
Lymphs Abs: 1.1 10*3/uL (ref 0.7–4.0)
MCH: 31.3 pg (ref 26.0–34.0)
MCHC: 32.2 g/dL (ref 30.0–36.0)
MCV: 97.3 fL (ref 80.0–100.0)
Monocytes Absolute: 0.5 10*3/uL (ref 0.1–1.0)
Monocytes Relative: 10 %
Neutro Abs: 3.3 10*3/uL (ref 1.7–7.7)
Neutrophils Relative %: 62 %
Platelets: 260 10*3/uL (ref 150–400)
RBC: 3.35 MIL/uL — ABNORMAL LOW (ref 4.22–5.81)
RDW: 14.9 % (ref 11.5–15.5)
WBC: 5.2 10*3/uL (ref 4.0–10.5)
nRBC: 0 % (ref 0.0–0.2)

## 2019-05-19 LAB — TSH: TSH: 1.459 u[IU]/mL (ref 0.320–4.118)

## 2019-05-19 MED ORDER — FAMOTIDINE IN NACL 20-0.9 MG/50ML-% IV SOLN
INTRAVENOUS | Status: AC
  Filled 2019-05-19: qty 50

## 2019-05-19 MED ORDER — DIPHENHYDRAMINE HCL 50 MG/ML IJ SOLN
INTRAMUSCULAR | Status: AC
  Filled 2019-05-19: qty 1

## 2019-05-19 MED ORDER — PALONOSETRON HCL INJECTION 0.25 MG/5ML
0.2500 mg | Freq: Once | INTRAVENOUS | Status: AC
  Administered 2019-05-19: 0.25 mg via INTRAVENOUS

## 2019-05-19 MED ORDER — PALONOSETRON HCL INJECTION 0.25 MG/5ML
INTRAVENOUS | Status: AC
  Filled 2019-05-19: qty 5

## 2019-05-19 MED ORDER — SODIUM CHLORIDE 0.9 % IV SOLN
10.0000 mg | Freq: Once | INTRAVENOUS | Status: AC
  Administered 2019-05-19: 10 mg via INTRAVENOUS
  Filled 2019-05-19: qty 10

## 2019-05-19 MED ORDER — SODIUM CHLORIDE 0.9 % IV SOLN
Freq: Once | INTRAVENOUS | Status: AC
  Filled 2019-05-19: qty 250

## 2019-05-19 MED ORDER — FAMOTIDINE IN NACL 20-0.9 MG/50ML-% IV SOLN
20.0000 mg | Freq: Once | INTRAVENOUS | Status: AC
  Administered 2019-05-19: 20 mg via INTRAVENOUS

## 2019-05-19 MED ORDER — SODIUM CHLORIDE 0.9 % IV SOLN
200.0000 mg | Freq: Once | INTRAVENOUS | Status: AC
  Administered 2019-05-19: 200 mg via INTRAVENOUS
  Filled 2019-05-19: qty 8

## 2019-05-19 MED ORDER — DIPHENHYDRAMINE HCL 50 MG/ML IJ SOLN
50.0000 mg | Freq: Once | INTRAMUSCULAR | Status: AC
  Administered 2019-05-19: 50 mg via INTRAVENOUS

## 2019-05-19 MED ORDER — SODIUM CHLORIDE 0.9 % IV SOLN
175.0000 mg/m2 | Freq: Once | INTRAVENOUS | Status: AC
  Administered 2019-05-19: 372 mg via INTRAVENOUS
  Filled 2019-05-19: qty 62

## 2019-05-19 MED ORDER — SODIUM CHLORIDE 0.9 % IV SOLN
150.0000 mg | Freq: Once | INTRAVENOUS | Status: AC
  Administered 2019-05-19: 150 mg via INTRAVENOUS
  Filled 2019-05-19: qty 150

## 2019-05-19 MED ORDER — SODIUM CHLORIDE 0.9 % IV SOLN
492.0000 mg | Freq: Once | INTRAVENOUS | Status: AC
  Administered 2019-05-19: 490 mg via INTRAVENOUS
  Filled 2019-05-19: qty 49

## 2019-05-19 NOTE — Patient Instructions (Signed)
[facility] Discharge Instructions for Patients Receiving Chemotherapy  Today you received the following chemotherapy agents Keytruda, Paclitaxel, Carboplatin  To help prevent nausea and vomiting after your treatment, we encourage you to take your nausea medication as prescribed.   If you develop nausea and vomiting that is not controlled by your nausea medication, call the clinic.   BELOW ARE SYMPTOMS THAT SHOULD BE REPORTED IMMEDIATELY:  *FEVER GREATER THAN 100.5 F  *CHILLS WITH OR WITHOUT FEVER  NAUSEA AND VOMITING THAT IS NOT CONTROLLED WITH YOUR NAUSEA MEDICATION  *UNUSUAL SHORTNESS OF BREATH  *UNUSUAL BRUISING OR BLEEDING  TENDERNESS IN MOUTH AND THROAT WITH OR WITHOUT PRESENCE OF ULCERS  *URINARY PROBLEMS  *BOWEL PROBLEMS  UNUSUAL RASH Items with * indicate a potential emergency and should be followed up as soon as possible.  Feel free to call the clinic should you have any questions or concerns. The clinic phone number is [phone].  Please show the [place] at check-in to the Emergency Department and triage nurse.  Pembrolizumab injection What is this medicine? PEMBROLIZUMAB (pem broe liz ue mab) is a monoclonal antibody. It is used to treat certain types of cancer. This medicine may be used for other purposes; ask your health care provider or pharmacist if you have questions. COMMON BRAND NAME(S): Keytruda What should I tell my health care provider before I take this medicine? They need to know if you have any of these conditions:  diabetes  immune system problems  inflammatory bowel disease  liver disease  lung or breathing disease  lupus  received or scheduled to receive an organ transplant or a stem-cell transplant that uses donor stem cells  an unusual or allergic reaction to pembrolizumab, other medicines, foods, dyes, or preservatives  pregnant or trying to get pregnant  breast-feeding How should I use this  medicine? This medicine is for infusion into a vein. It is given by a health care professional in a hospital or clinic setting. A special MedGuide will be given to you before each treatment. Be sure to read this information carefully each time. Talk to your pediatrician regarding the use of this medicine in children. While this drug may be prescribed for children as young as 6 months for selected conditions, precautions do apply. Overdosage: If you think you have taken too much of this medicine contact a poison control center or emergency room at once. NOTE: This medicine is only for you. Do not share this medicine with others. What if I miss a dose? It is important not to miss your dose. Call your doctor or health care professional if you are unable to keep an appointment. What may interact with this medicine? Interactions have not been studied. Give your health care provider a list of all the medicines, herbs, non-prescription drugs, or dietary supplements you use. Also tell them if you smoke, drink alcohol, or use illegal drugs. Some items may interact with your medicine. This list may not describe all possible interactions. Give your health care provider a list of all the medicines, herbs, non-prescription drugs, or dietary supplements you use. Also tell them if you smoke, drink alcohol, or use illegal drugs. Some items may interact with your medicine. What should I watch for while using this medicine? Your condition will be monitored carefully while you are receiving this medicine. You may need blood work done while you are taking this medicine. Do not become pregnant while taking this medicine or for 4 months after stopping it.  Women should inform their doctor if they wish to become pregnant or think they might be pregnant. There is a potential for serious side effects to an unborn child. Talk to your health care professional or pharmacist for more information. Do not breast-feed an infant while  taking this medicine or for 4 months after the last dose. What side effects may I notice from receiving this medicine? Side effects that you should report to your doctor or health care professional as soon as possible:  allergic reactions like skin rash, itching or hives, swelling of the face, lips, or tongue  bloody or black, tarry  breathing problems  changes in vision  chest pain  chills  confusion  constipation  cough  diarrhea  dizziness or feeling faint or lightheaded  fast or irregular heartbeat  fever  flushing  joint pain  low blood counts - this medicine may decrease the number of white blood cells, red blood cells and platelets. You may be at increased risk for infections and bleeding.  muscle pain  muscle weakness  pain, tingling, numbness in the hands or feet  persistent headache  redness, blistering, peeling or loosening of the skin, including inside the mouth  signs and symptoms of high blood sugar such as dizziness; dry mouth; dry skin; fruity breath; nausea; stomach pain; increased hunger or thirst; increased urination  signs and symptoms of kidney injury like trouble passing urine or change in the amount of urine  signs and symptoms of liver injury like dark urine, light-colored stools, loss of appetite, nausea, right upper belly pain, yellowing of the eyes or skin  sweating  swollen lymph nodes  weight loss Side effects that usually do not require medical attention (report to your doctor or health care professional if they continue or are bothersome):  decreased appetite  hair loss  muscle pain  tiredness This list may not describe all possible side effects. Call your doctor for medical advice about side effects. You may report side effects to FDA at 1-800-FDA-1088. Where should I keep my medicine? This drug is given in a hospital or clinic and will not be stored at home. NOTE: This sheet is a summary. It may not cover all  possible information. If you have questions about this medicine, talk to your doctor, pharmacist, or health care provider.  2020 Elsevier/Gold Standard (2018-12-04 18:07:58)  Paclitaxel injection What is this medicine? PACLITAXEL (PAK li TAX el) is a chemotherapy drug. It targets fast dividing cells, like cancer cells, and causes these cells to die. This medicine is used to treat ovarian cancer, breast cancer, lung cancer, Kaposi's sarcoma, and other cancers. This medicine may be used for other purposes; ask your health care provider or pharmacist if you have questions. COMMON BRAND NAME(S): Onxol, Taxol What should I tell my health care provider before I take this medicine? They need to know if you have any of these conditions:  history of irregular heartbeat  liver disease  low blood counts, like low white cell, platelet, or red cell counts  lung or breathing disease, like asthma  tingling of the fingers or toes, or other nerve disorder  an unusual or allergic reaction to paclitaxel, alcohol, polyoxyethylated castor oil, other chemotherapy, other medicines, foods, dyes, or preservatives  pregnant or trying to get pregnant  breast-feeding How should I use this medicine? This drug is given as an infusion into a vein. It is administered in a hospital or clinic by a specially trained health care professional. Talk to  your pediatrician regarding the use of this medicine in children. Special care may be needed. Overdosage: If you think you have taken too much of this medicine contact a poison control center or emergency room at once. NOTE: This medicine is only for you. Do not share this medicine with others. What if I miss a dose? It is important not to miss your dose. Call your doctor or health care professional if you are unable to keep an appointment. What may interact with this medicine? Do not take this medicine with any of the following  medications:  disulfiram  metronidazole This medicine may also interact with the following medications:  antiviral medicines for hepatitis, HIV or AIDS  certain antibiotics like erythromycin and clarithromycin  certain medicines for fungal infections like ketoconazole and itraconazole  certain medicines for seizures like carbamazepine, phenobarbital, phenytoin  gemfibrozil  nefazodone  rifampin  St. John's wort This list may not describe all possible interactions. Give your health care provider a list of all the medicines, herbs, non-prescription drugs, or dietary supplements you use. Also tell them if you smoke, drink alcohol, or use illegal drugs. Some items may interact with your medicine. What should I watch for while using this medicine? Your condition will be monitored carefully while you are receiving this medicine. You will need important blood work done while you are taking this medicine. This medicine can cause serious allergic reactions. To reduce your risk you will need to take other medicine(s) before treatment with this medicine. If you experience allergic reactions like skin rash, itching or hives, swelling of the face, lips, or tongue, tell your doctor or health care professional right away. In some cases, you may be given additional medicines to help with side effects. Follow all directions for their use. This drug may make you feel generally unwell. This is not uncommon, as chemotherapy can affect healthy cells as well as cancer cells. Report any side effects. Continue your course of treatment even though you feel ill unless your doctor tells you to stop. Call your doctor or health care professional for advice if you get a fever, chills or sore throat, or other symptoms of a cold or flu. Do not treat yourself. This drug decreases your body's ability to fight infections. Try to avoid being around people who are sick. This medicine may increase your risk to bruise or  bleed. Call your doctor or health care professional if you notice any unusual bleeding. Be careful brushing and flossing your teeth or using a toothpick because you may get an infection or bleed more easily. If you have any dental work done, tell your dentist you are receiving this medicine. Avoid taking products that contain aspirin, acetaminophen, ibuprofen, naproxen, or ketoprofen unless instructed by your doctor. These medicines may hide a fever. Do not become pregnant while taking this medicine. Women should inform their doctor if they wish to become pregnant or think they might be pregnant. There is a potential for serious side effects to an unborn child. Talk to your health care professional or pharmacist for more information. Do not breast-feed an infant while taking this medicine. Men are advised not to father a child while receiving this medicine. This product may contain alcohol. Ask your pharmacist or healthcare provider if this medicine contains alcohol. Be sure to tell all healthcare providers you are taking this medicine. Certain medicines, like metronidazole and disulfiram, can cause an unpleasant reaction when taken with alcohol. The reaction includes flushing, headache, nausea, vomiting, sweating, and  increased thirst. The reaction can last from 30 minutes to several hours. What side effects may I notice from receiving this medicine? Side effects that you should report to your doctor or health care professional as soon as possible:  allergic reactions like skin rash, itching or hives, swelling of the face, lips, or tongue  breathing problems  changes in vision  fast, irregular heartbeat  high or low blood pressure  mouth sores  pain, tingling, numbness in the hands or feet  signs of decreased platelets or bleeding - bruising, pinpoint red spots on the skin, black, tarry stools, blood in the urine  signs of decreased red blood cells - unusually weak or tired, feeling faint  or lightheaded, falls  signs of infection - fever or chills, cough, sore throat, pain or difficulty passing urine  signs and symptoms of liver injury like dark yellow or brown urine; general ill feeling or flu-like symptoms; light-colored stools; loss of appetite; nausea; right upper belly pain; unusually weak or tired; yellowing of the eyes or skin  swelling of the ankles, feet, hands  unusually slow heartbeat Side effects that usually do not require medical attention (report to your doctor or health care professional if they continue or are bothersome):  diarrhea  hair loss  loss of appetite  muscle or joint pain  nausea, vomiting  pain, redness, or irritation at site where injected  tiredness This list may not describe all possible side effects. Call your doctor for medical advice about side effects. You may report side effects to FDA at 1-800-FDA-1088. Where should I keep my medicine? This drug is given in a hospital or clinic and will not be stored at home. NOTE: This sheet is a summary. It may not cover all possible information. If you have questions about this medicine, talk to your doctor, pharmacist, or health care provider.  2020 Elsevier/Gold Standard (2016-10-01 13:14:55)   Carboplatin injection What is this medicine? CARBOPLATIN (KAR boe pla tin) is a chemotherapy drug. It targets fast dividing cells, like cancer cells, and causes these cells to die. This medicine is used to treat ovarian cancer and many other cancers. This medicine may be used for other purposes; ask your health care provider or pharmacist if you have questions. COMMON BRAND NAME(S): Paraplatin What should I tell my health care provider before I take this medicine? They need to know if you have any of these conditions:  blood disorders  hearing problems  kidney disease  recent or ongoing radiation therapy  an unusual or allergic reaction to carboplatin, cisplatin, other chemotherapy,  other medicines, foods, dyes, or preservatives  pregnant or trying to get pregnant  breast-feeding How should I use this medicine? This drug is usually given as an infusion into a vein. It is administered in a hospital or clinic by a specially trained health care professional. Talk to your pediatrician regarding the use of this medicine in children. Special care may be needed. Overdosage: If you think you have taken too much of this medicine contact a poison control center or emergency room at once. NOTE: This medicine is only for you. Do not share this medicine with others. What if I miss a dose? It is important not to miss a dose. Call your doctor or health care professional if you are unable to keep an appointment. What may interact with this medicine?  medicines for seizures  medicines to increase blood counts like filgrastim, pegfilgrastim, sargramostim  some antibiotics like amikacin, gentamicin, neomycin, streptomycin, tobramycin  vaccines Talk to your doctor or health care professional before taking any of these medicines:  acetaminophen  aspirin  ibuprofen  ketoprofen  naproxen This list may not describe all possible interactions. Give your health care provider a list of all the medicines, herbs, non-prescription drugs, or dietary supplements you use. Also tell them if you smoke, drink alcohol, or use illegal drugs. Some items may interact with your medicine. What should I watch for while using this medicine? Your condition will be monitored carefully while you are receiving this medicine. You will need important blood work done while you are taking this medicine. This drug may make you feel generally unwell. This is not uncommon, as chemotherapy can affect healthy cells as well as cancer cells. Report any side effects. Continue your course of treatment even though you feel ill unless your doctor tells you to stop. In some cases, you may be given additional medicines to  help with side effects. Follow all directions for their use. Call your doctor or health care professional for advice if you get a fever, chills or sore throat, or other symptoms of a cold or flu. Do not treat yourself. This drug decreases your body's ability to fight infections. Try to avoid being around people who are sick. This medicine may increase your risk to bruise or bleed. Call your doctor or health care professional if you notice any unusual bleeding. Be careful brushing and flossing your teeth or using a toothpick because you may get an infection or bleed more easily. If you have any dental work done, tell your dentist you are receiving this medicine. Avoid taking products that contain aspirin, acetaminophen, ibuprofen, naproxen, or ketoprofen unless instructed by your doctor. These medicines may hide a fever. Do not become pregnant while taking this medicine. Women should inform their doctor if they wish to become pregnant or think they might be pregnant. There is a potential for serious side effects to an unborn child. Talk to your health care professional or pharmacist for more information. Do not breast-feed an infant while taking this medicine. What side effects may I notice from receiving this medicine? Side effects that you should report to your doctor or health care professional as soon as possible:  allergic reactions like skin rash, itching or hives, swelling of the face, lips, or tongue  signs of infection - fever or chills, cough, sore throat, pain or difficulty passing urine  signs of decreased platelets or bleeding - bruising, pinpoint red spots on the skin, black, tarry stools, nosebleeds  signs of decreased red blood cells - unusually weak or tired, fainting spells, lightheadedness  breathing problems  changes in hearing  changes in vision  chest pain  high blood pressure  low blood counts - This drug may decrease the number of white blood cells, red blood cells  and platelets. You may be at increased risk for infections and bleeding.  nausea and vomiting  pain, swelling, redness or irritation at the injection site  pain, tingling, numbness in the hands or feet  problems with balance, talking, walking  trouble passing urine or change in the amount of urine Side effects that usually do not require medical attention (report to your doctor or health care professional if they continue or are bothersome):  hair loss  loss of appetite  metallic taste in the mouth or changes in taste This list may not describe all possible side effects. Call your doctor for medical advice about side effects. You may report side  effects to FDA at 1-800-FDA-1088. Where should I keep my medicine? This drug is given in a hospital or clinic and will not be stored at home. NOTE: This sheet is a summary. It may not cover all possible information. If you have questions about this medicine, talk to your doctor, pharmacist, or health care provider.  2020 Elsevier/Gold Standard (2007-05-05 14:38:05)

## 2019-05-20 ENCOUNTER — Other Ambulatory Visit: Payer: Self-pay

## 2019-05-20 ENCOUNTER — Other Ambulatory Visit: Payer: Self-pay | Admitting: Radiology

## 2019-05-20 ENCOUNTER — Encounter (HOSPITAL_COMMUNITY)
Admission: RE | Admit: 2019-05-20 | Discharge: 2019-05-20 | Disposition: A | Discharge status: Home / Self Care | Payer: No Typology Code available for payment source | Source: Ambulatory Visit | Attending: Internal Medicine | Admitting: Internal Medicine

## 2019-05-20 DIAGNOSIS — I2510 Atherosclerotic heart disease of native coronary artery without angina pectoris: Secondary | ICD-10-CM | POA: Insufficient documentation

## 2019-05-20 DIAGNOSIS — J439 Emphysema, unspecified: Secondary | ICD-10-CM | POA: Diagnosis not present

## 2019-05-20 DIAGNOSIS — C787 Secondary malignant neoplasm of liver and intrahepatic bile duct: Secondary | ICD-10-CM | POA: Diagnosis not present

## 2019-05-20 DIAGNOSIS — C3490 Malignant neoplasm of unspecified part of unspecified bronchus or lung: Secondary | ICD-10-CM | POA: Diagnosis not present

## 2019-05-20 DIAGNOSIS — I700 Atherosclerosis of aorta: Secondary | ICD-10-CM | POA: Diagnosis not present

## 2019-05-20 DIAGNOSIS — C771 Secondary and unspecified malignant neoplasm of intrathoracic lymph nodes: Secondary | ICD-10-CM | POA: Insufficient documentation

## 2019-05-20 LAB — GLUCOSE, CAPILLARY: Glucose-Capillary: 138 mg/dL — ABNORMAL HIGH (ref 70–99)

## 2019-05-20 MED ORDER — FLUDEOXYGLUCOSE F - 18 (FDG) INJECTION
9.9400 | Freq: Once | INTRAVENOUS | Status: AC | PRN
  Administered 2019-05-20: 9.94 via INTRAVENOUS

## 2019-05-21 ENCOUNTER — Other Ambulatory Visit: Payer: Self-pay | Admitting: Internal Medicine

## 2019-05-21 ENCOUNTER — Ambulatory Visit (HOSPITAL_COMMUNITY)
Admission: RE | Admit: 2019-05-21 | Discharge: 2019-05-21 | Disposition: A | Discharge status: Home / Self Care | Payer: No Typology Code available for payment source | Source: Ambulatory Visit | Attending: Internal Medicine | Admitting: Internal Medicine

## 2019-05-21 ENCOUNTER — Inpatient Hospital Stay: Payer: No Typology Code available for payment source

## 2019-05-21 ENCOUNTER — Other Ambulatory Visit: Payer: Self-pay

## 2019-05-21 ENCOUNTER — Encounter (HOSPITAL_COMMUNITY): Payer: Self-pay

## 2019-05-21 VITALS — BP 148/72 | HR 78 | Temp 98.2°F | Resp 18

## 2019-05-21 DIAGNOSIS — C787 Secondary malignant neoplasm of liver and intrahepatic bile duct: Secondary | ICD-10-CM | POA: Diagnosis not present

## 2019-05-21 DIAGNOSIS — Z7901 Long term (current) use of anticoagulants: Secondary | ICD-10-CM | POA: Diagnosis not present

## 2019-05-21 DIAGNOSIS — Z79899 Other long term (current) drug therapy: Secondary | ICD-10-CM | POA: Insufficient documentation

## 2019-05-21 DIAGNOSIS — I4891 Unspecified atrial fibrillation: Secondary | ICD-10-CM | POA: Diagnosis not present

## 2019-05-21 DIAGNOSIS — Z87891 Personal history of nicotine dependence: Secondary | ICD-10-CM | POA: Insufficient documentation

## 2019-05-21 DIAGNOSIS — E119 Type 2 diabetes mellitus without complications: Secondary | ICD-10-CM | POA: Insufficient documentation

## 2019-05-21 DIAGNOSIS — C3490 Malignant neoplasm of unspecified part of unspecified bronchus or lung: Secondary | ICD-10-CM | POA: Insufficient documentation

## 2019-05-21 DIAGNOSIS — I10 Essential (primary) hypertension: Secondary | ICD-10-CM | POA: Insufficient documentation

## 2019-05-21 DIAGNOSIS — Z5111 Encounter for antineoplastic chemotherapy: Secondary | ICD-10-CM | POA: Diagnosis not present

## 2019-05-21 DIAGNOSIS — C3491 Malignant neoplasm of unspecified part of right bronchus or lung: Secondary | ICD-10-CM

## 2019-05-21 HISTORY — PX: IR IMAGING GUIDED PORT INSERTION: IMG5740

## 2019-05-21 LAB — CBC WITH DIFFERENTIAL/PLATELET
Abs Immature Granulocytes: 0.02 10*3/uL (ref 0.00–0.07)
Basophils Absolute: 0 10*3/uL (ref 0.0–0.1)
Basophils Relative: 0 %
Eosinophils Absolute: 0.1 10*3/uL (ref 0.0–0.5)
Eosinophils Relative: 2 %
HCT: 30.7 % — ABNORMAL LOW (ref 39.0–52.0)
Hemoglobin: 9.8 g/dL — ABNORMAL LOW (ref 13.0–17.0)
Immature Granulocytes: 1 %
Lymphocytes Relative: 27 %
Lymphs Abs: 0.9 10*3/uL (ref 0.7–4.0)
MCH: 30.9 pg (ref 26.0–34.0)
MCHC: 31.9 g/dL (ref 30.0–36.0)
MCV: 96.8 fL (ref 80.0–100.0)
Monocytes Absolute: 0.2 10*3/uL (ref 0.1–1.0)
Monocytes Relative: 5 %
Neutro Abs: 2.1 10*3/uL (ref 1.7–7.7)
Neutrophils Relative %: 65 %
Platelets: 183 10*3/uL (ref 150–400)
RBC: 3.17 MIL/uL — ABNORMAL LOW (ref 4.22–5.81)
RDW: 14.9 % (ref 11.5–15.5)
WBC: 3.3 10*3/uL — ABNORMAL LOW (ref 4.0–10.5)
nRBC: 0 % (ref 0.0–0.2)

## 2019-05-21 LAB — PROTIME-INR
INR: 1 (ref 0.8–1.2)
Prothrombin Time: 12.7 seconds (ref 11.4–15.2)

## 2019-05-21 LAB — GLUCOSE, CAPILLARY: Glucose-Capillary: 97 mg/dL (ref 70–99)

## 2019-05-21 MED ORDER — HEPARIN SOD (PORK) LOCK FLUSH 100 UNIT/ML IV SOLN
INTRAVENOUS | Status: AC | PRN
  Administered 2019-05-21: 500 [IU] via INTRAVENOUS

## 2019-05-21 MED ORDER — LIDOCAINE-EPINEPHRINE 1 %-1:100000 IJ SOLN
INTRAMUSCULAR | Status: AC
  Filled 2019-05-21: qty 1

## 2019-05-21 MED ORDER — LIDOCAINE-EPINEPHRINE 1 %-1:100000 IJ SOLN
INTRAMUSCULAR | Status: AC | PRN
  Administered 2019-05-21 (×2): 10 mL via INTRADERMAL

## 2019-05-21 MED ORDER — PEGFILGRASTIM-JMDB 6 MG/0.6ML ~~LOC~~ SOSY
6.0000 mg | PREFILLED_SYRINGE | Freq: Once | SUBCUTANEOUS | Status: AC
  Administered 2019-05-21: 6 mg via SUBCUTANEOUS

## 2019-05-21 MED ORDER — CEFAZOLIN SODIUM-DEXTROSE 2-4 GM/100ML-% IV SOLN
2.0000 g | INTRAVENOUS | Status: AC
  Administered 2019-05-21: 2 g via INTRAVENOUS

## 2019-05-21 MED ORDER — FENTANYL CITRATE (PF) 100 MCG/2ML IJ SOLN
INTRAMUSCULAR | Status: AC | PRN
  Administered 2019-05-21 (×2): 50 ug via INTRAVENOUS

## 2019-05-21 MED ORDER — PEGFILGRASTIM-JMDB 6 MG/0.6ML ~~LOC~~ SOSY
PREFILLED_SYRINGE | SUBCUTANEOUS | Status: AC
  Filled 2019-05-21: qty 0.6

## 2019-05-21 MED ORDER — MIDAZOLAM HCL 2 MG/2ML IJ SOLN
INTRAMUSCULAR | Status: AC | PRN
  Administered 2019-05-21 (×2): 1 mg via INTRAVENOUS

## 2019-05-21 MED ORDER — HEPARIN SOD (PORK) LOCK FLUSH 100 UNIT/ML IV SOLN
INTRAVENOUS | Status: AC
  Filled 2019-05-21: qty 5

## 2019-05-21 MED ORDER — CEFAZOLIN SODIUM-DEXTROSE 2-4 GM/100ML-% IV SOLN
INTRAVENOUS | Status: AC
  Filled 2019-05-21: qty 100

## 2019-05-21 MED ORDER — MIDAZOLAM HCL 2 MG/2ML IJ SOLN
INTRAMUSCULAR | Status: AC
  Filled 2019-05-21: qty 2

## 2019-05-21 MED ORDER — SODIUM CHLORIDE 0.9 % IV SOLN: INTRAVENOUS | Status: DC

## 2019-05-21 MED ORDER — FENTANYL CITRATE (PF) 100 MCG/2ML IJ SOLN
INTRAMUSCULAR | Status: AC
  Filled 2019-05-21: qty 2

## 2019-05-21 NOTE — Patient Instructions (Signed)

## 2019-05-21 NOTE — Procedures (Signed)
Interventional Radiology Procedure Note  Procedure: Placement of a right IJ approach single lumen PowerPort.  Tip is positioned at the superior cavoatrial junction and catheter is ready for immediate use.  Complications: No immediate Recommendations:  - Ok to shower tomorrow - Do not submerge for 7 days - Routine line care   Signed,  Shereece Wellborn K. Pachia Strum, MD   

## 2019-05-21 NOTE — Discharge Instructions (Addendum)
Urgent needs - IR on call MD [phone]  Wound - May remove dressing and shower in 24 to 48 hours.  Keep site clean and dry.  Replace with bandaid. Do not submerge in tub or water until site healing well.  If ordered by your provider, may start Emla cream in 2 weeks or after incision is healed.  After completion of treatment, your provider should have you set up for monthly port flushes.    Implanted Port Insertion, Care After This sheet gives you information about how to care for yourself after your procedure. Your health care provider may also give you more specific instructions. If you have problems or questions, contact your health care provider. What can I expect after the procedure? After the procedure, it is common to have:  Discomfort at the port insertion site.  Bruising on the skin over the port. This should improve over 3-4 days. Follow these instructions at home: Port care  After your port is placed, you will get a manufacturer's information card. The card has information about your port. Keep this card with you at all times.  Take care of the port as told by your health care provider. Ask your health care provider if you or a family member can get training for taking care of the port at home. A home health care nurse may also take care of the port.  Make sure to remember what type of port you have. Incision care  Follow instructions from your health care provider about how to take care of your port insertion site. Make sure you: ? Wash your hands with soap and water before and after you change your bandage (dressing). If soap and water are not available, use hand sanitizer. ? Change your dressing as told by your health care provider. ? Leave stitches (sutures), skin glue, or adhesive strips in place. These skin closures may need to stay in place for 2 weeks or longer. If adhesive strip edges start to loosen and curl up, you may trim the loose edges. Do not remove adhesive  strips completely unless your health care provider tells you to do that.  Check your port insertion site every day for signs of infection. Check for: ? Redness, swelling, or pain. ? Fluid or blood. ? Warmth. ? Pus or a bad smell. Activity  Return to your normal activities as told by your health care provider. Ask your health care provider what activities are safe for you.  Do not lift anything that is heavier than 10 lb (4.5 kg), or the limit that you are told, until your health care provider says that it is safe. General instructions  Take over-the-counter and prescription medicines only as told by your health care provider.  Do not take baths, swim, or use a hot tub until your health care provider approves. Ask your health care provider if you may take showers. You may only be allowed to take sponge baths.  Do not drive for 24 hours if you were given a sedative during your procedure.  Wear a medical alert bracelet in case of an emergency. This will tell any health care providers that you have a port.  Keep all follow-up visits as told by your health care provider. This is important. Contact a health care provider if:  You cannot flush your port with saline as directed, or you cannot draw blood from the port.  You have a fever or chills.  You have redness, swelling, or pain around your port   insertion site.  You have fluid or blood coming from your port insertion site.  Your port insertion site feels warm to the touch.  You have pus or a bad smell coming from the port insertion site. Get help right away if:  You have chest pain or shortness of breath.  You have bleeding from your port that you cannot control. Summary  Take care of the port as told by your health care provider. Keep the manufacturer's information card with you at all times.  Change your dressing as told by your health care provider.  Contact a health care provider if you have a fever or chills or if you  have redness, swelling, or pain around your port insertion site.  Keep all follow-up visits as told by your health care provider. This information is not intended to replace advice given to you by your health care provider. Make sure you discuss any questions you have with your health care provider. Document Revised: 08/26/2017 Document Reviewed: 08/26/2017 [employer] Patient Education  2020 [employer]   Moderate Conscious Sedation, Adult, Care After These instructions provide you with information about caring for yourself after your procedure. Your health care provider may also give you more specific instructions. Your treatment has been planned according to current medical practices, but problems sometimes occur. Call your health care provider if you have any problems or questions after your procedure. What can I expect after the procedure? After your procedure, it is common:  To feel sleepy for several hours.  To feel clumsy and have poor balance for several hours.  To have poor judgment for several hours.  To vomit if you eat too soon. Follow these instructions at home: For at least 24 hours after the procedure:  Do not: ? Participate in activities where you could fall or become injured. ? Drive. ? Use heavy machinery. ? Drink alcohol. ? Take sleeping pills or medicines that cause drowsiness. ? Make important decisions or sign legal documents. ? Take care of children on your own.  Rest. Eating and drinking  Follow the diet recommended by your health care provider.  If you vomit: ? Drink water, juice, or soup when you can drink without vomiting. ? Make sure you have little or no nausea before eating solid foods. General instructions  Have a responsible adult stay with you until you are awake and alert.  Take over-the-counter and prescription medicines only as told by your health care provider.  If you smoke, do not smoke without supervision.  Keep all follow-up  visits as told by your health care provider. This is important. Contact a health care provider if:  You keep feeling nauseous or you keep vomiting.  You feel light-headed.  You develop a rash.  You have a fever. Get help right away if:  You have trouble breathing. This information is not intended to replace advice given to you by your health care provider. Make sure you discuss any questions you have with your health care provider. Document Revised: 01/10/2017 Document Reviewed: 05/20/2015 [employer] Patient Education  2020 [employer]   

## 2019-05-21 NOTE — Progress Notes (Signed)
Post port insertion procedure - After complaining of numbness in left thigh and being seen by Dr Cathey Endow, Pt ambulates with ease in hall (with alleviation of numbness symptoms in thigh).  Discharge pending.

## 2019-05-21 NOTE — Consult Note (Addendum)
Chief Complaint: Patient was seen in consultation today for Port-A-Cath placement  Referring Physician(s): Mohamed,Mohamed  Supervising Physician: Jacqulynn Cadet  Patient Status: [facility] - Out-pt  History of Present Illness: Dennis Stephens is an 86 y.o. male , ex smoker, with history of stage IV squamous cell carcinoma of the lung who initially presented with bilateral pulmonary nodules in addition to mediastinal lymphadenopathy and liver metastasis in March 2021.  He is scheduled today for Port-A-Cath placement for palliative chemotherapy.  Additional medical history as listed below.  Past Medical History:  Diagnosis Date  . A-fib ([place])   . DM2 (diabetes mellitus, type 2) ([place])   . Hypertension     Past Surgical History:  Procedure Laterality Date  . BRONCHIAL BIOPSY  04/28/2019   Procedure: BRONCHIAL BIOPSIES;  Surgeon: Garner Nash, DO;  Location: [place] [facility];  Service: Pulmonary;;  . BRONCHIAL BRUSHINGS  04/28/2019   Procedure: BRONCHIAL BRUSHINGS;  Surgeon: Garner Nash, DO;  Location: [place] [facility];  Service: Pulmonary;;  . BRONCHIAL NEEDLE ASPIRATION BIOPSY  04/28/2019   Procedure: BRONCHIAL NEEDLE ASPIRATION BIOPSIES;  Surgeon: Garner Nash, DO;  Location: [place] [facility];  Service: Pulmonary;;  . BRONCHIAL WASHINGS  04/28/2019   Procedure: BRONCHIAL WASHINGS;  Surgeon: Garner Nash, DO;  Location: [place];  Service: Pulmonary;;  . ENDOBRONCHIAL ULTRASOUND N/A 04/28/2019   Procedure: ENDOBRONCHIAL ULTRASOUND;  Surgeon: Garner Nash, DO;  Location: [place];  Service: Pulmonary;  Laterality: N/A;  . ERCP N/A 04/27/2019   Procedure: ENDOSCOPIC RETROGRADE CHOLANGIOPANCREATOGRAPHY (ERCP);  Surgeon: Irene Shipper, MD;  Location: [facility];  Service: Endoscopy;  Laterality: N/A;  . ESOPHAGOGASTRODUODENOSCOPY (EGD) WITH PROPOFOL N/A 05/04/2019   Procedure: ESOPHAGOGASTRODUODENOSCOPY (EGD) WITH PROPOFOL;  Surgeon: Irene Shipper, MD;  Location: [facility];  Service: Endoscopy;  Laterality: N/A;  . LIPOMA EXCISION    . REMOVAL OF STONES  04/27/2019   Procedure: REMOVAL OF STONES;  Surgeon: Irene Shipper, MD;  Location: [facility];  Service: Endoscopy;;  . Joan Mayans  04/27/2019   Procedure: Joan Mayans;  Surgeon: Irene Shipper, MD;  Location: [facility];  Service: Endoscopy;;  . VIDEO BRONCHOSCOPY WITH ENDOBRONCHIAL NAVIGATION N/A 04/28/2019   Procedure: VIDEO BRONCHOSCOPY WITH ENDOBRONCHIAL NAVIGATION;  Surgeon: Garner Nash, DO;  Location: [place];  Service: Pulmonary;  Laterality: N/A;    Allergies: Patient has no known allergies.  Medications: Prior to Admission medications   Medication Sig Start Date End Date Taking? Authorizing Provider  amLODipine (NORVASC) 2.5 MG tablet Take 2.5 mg by mouth daily.    [provider]  apixaban (ELIQUIS) 5 MG TABS tablet Take 1 tablet (5 mg total) by mouth 2 (two) times daily. 05/06/19   Georgette Shell, MD  atorvastatin (LIPITOR) 40 MG tablet Take 40 mg by mouth daily.    [provider]  carvedilol (COREG) 12.5 MG tablet Take 12.5 mg by mouth 2 (two) times daily with a meal.    [provider]  doxazosin (CARDURA) 8 MG tablet Take 8 mg by mouth daily.    [provider]  glimepiride (AMARYL) 2 MG tablet Take 1 tablet (2 mg total) by mouth daily with breakfast. 04/22/19 04/21/20  Rai, Vernelle Emerald, MD  lidocaine-prilocaine (EMLA) cream Apply to the Port-A-Cath site 30-60-minute before treatment. 05/18/19   Curt Bears, MD  loratadine (CLARITIN) 10 MG tablet Take 1 tablet (10 mg total) by mouth daily as needed for allergies or rhinitis. 04/22/19   Mendel Corning, MD  losartan (  COZAAR) 100 MG tablet Take 100 mg by mouth daily.    [provider]  omeprazole (PRILOSEC) 20 MG capsule Take 20 mg by mouth daily.    [provider]  prochlorperazine (COMPAZINE) 10 MG tablet Take 1 tablet (10 mg total) by mouth every 6 (six)  hours as needed for nausea or vomiting. 05/18/19   Curt Bears, MD  sertraline (ZOLOFT) 100 MG tablet Take 100 mg by mouth daily.    [provider]  temazepam (RESTORIL) 30 MG capsule Take 30 mg by mouth at bedtime as needed for sleep.    [provider]     Family History  Problem Relation Age of Onset  . Hypertension Mother   . Hypertension Father   . Diabetes Mellitus II Neg Hx     Social History   Socioeconomic History  . Marital status: Married    Spouse name: Not on file  . Number of children: Not on file  . Years of education: Not on file  . Highest education level: Not on file  Occupational History  . Not on file  Tobacco Use  . Smoking status: Former Research scientist (life sciences)  . Smokeless tobacco: Never Used  Substance and Sexual Activity  . Alcohol use: Not Currently  . Drug use: Not Currently  . Sexual activity: Not Currently  Other Topics Concern  . Not on file  Social History Narrative  . Not on file   Social Determinants of Health   Financial Resource Strain:   . Difficulty of Paying Living Expenses:   Food Insecurity:   . Worried About Charity fundraiser in the Last Year:   . Arboriculturist in the Last Year:   Transportation Needs:   . Film/video editor (Medical):   Marland Kitchen Lack of Transportation (Non-Medical):   Physical Activity:   . Days of Exercise per Week:   . Minutes of Exercise per Session:   Stress:   . Feeling of Stress :   Social Connections:   . Frequency of Communication with Friends and Family:   . Frequency of Social Gatherings with Friends and Family:   . Attends Religious Services:   . Active Member of Clubs or Organizations:   . Attends Archivist Meetings:   Marland Kitchen Marital Status:       Review of Systems currently denies fever, headache, chest pain, abdominal/back pain, nausea, vomiting or bleeding.  He does have dyspnea, cough, and fatigue.  Vital Signs: Blood pressure 148/72, temp 98.2, heart rate 78,  respirations 18, O2 sat 98% room air   Physical Exam awake, alert.  Chest with scattered rhonchi noted.  Heart with sl tachy rate, irregular rhythm.  Abdomen obese, soft, positive bowel sounds, nontender.  Bilateral lower extremity edema noted.  Imaging: CT ABDOMEN PELVIS W CONTRAST  Result Date: 05/02/2019 CLINICAL DATA:  86 year old male with history of nausea and vomiting. Suspected bowel obstruction or abdominal abscess/infection. EXAM: CT ABDOMEN AND PELVIS WITH CONTRAST TECHNIQUE: Multidetector CT imaging of the abdomen and pelvis was performed using the standard protocol following bolus administration of intravenous contrast. CONTRAST:  141mL OMNIPAQUE IOHEXOL 300 MG/ML  SOLN COMPARISON:  CT the abdomen and pelvis 04/20/2019. FINDINGS: Lower chest: Trace bilateral pleural effusions lying dependently. Atherosclerotic calcifications in the descending thoracic aorta as well as the right coronary artery. Mild fibrotic changes in the lung bases bilaterally, suggestive of nonspecific interstitial pneumonia. Hepatobiliary: Ill-defined hypovascular areas in the liver, best demonstrated by a 1.6 x 1.5 cm  lesion in segment 4B (axial image 21 of series 2), similar to the recent prior examination. No intra or extrahepatic biliary ductal dilatation. Gallbladder is nearly completely decompressed, but the gallbladder wall appears thickened with avid enhancement of the gallbladder mucosa and mild diffuse gallbladder wall edema and subtle surrounding inflammatory changes. Pancreas: No pancreatic mass. No pancreatic ductal dilatation. No pancreatic or peripancreatic fluid collections or inflammatory changes. Spleen: Unremarkable. Adrenals/Urinary Tract: Low-attenuation lesions in the right kidney, compatible with simple cysts, largest of which is an exophytic lesion in the upper pole measuring 4.8 cm. Other subcentimeter low-attenuation lesions in the kidneys bilaterally, too small to characterize, but statistically  likely to represent tiny cysts. Bilateral adrenal glands are normal in appearance. No hydroureteronephrosis. Urinary bladder is normal in appearance. Stomach/Bowel: Normal appearance of the stomach. No pathologic dilatation of small bowel or colon. Numerous colonic diverticulae are noted, particularly in the descending colon and sigmoid colon, without surrounding inflammatory changes to suggest an acute diverticulitis at this time. Normal appendix. Vascular/Lymphatic: Aortic atherosclerosis. 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 No lymphadenopathy noted in the abdomen or pelvis. No lymphadenopathy noted in the abdomen or pelvis. Reproductive: Prostate gland and seminal vesicles are unremarkable in appearance. Other: No significant volume of ascites.  No pneumoperitoneum. Musculoskeletal: There are no aggressive appearing lytic or blastic lesions noted in the visualized portions of the skeleton. IMPRESSION: 1. Although the gallbladder is nearly decompressed, and there are no definite calcified gallstones, the gallbladder wall appears thickened and edematous with hyperenhancement and subtle surrounding inflammatory changes. Clinical correlation for signs and symptoms of a calculus cholecystitis is suggested. 2. Ill-defined hepatic lesions again noted, the possibility of metastatic disease should be considered. This could be further evaluated with nonemergent abdominal MRI with and without IV gadolinium if clinically appropriate. 3. Colonic diverticulosis without evidence of acute diverticulitis at this time. 4. 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 5. Trace bilateral pleural effusions lying dependently. 6. The appearance of the lung bases suggests interstitial lung disease such as nonspecific interstitial pneumonia (NSIP). Outpatient referral to Pulmonology  should be considered in the near future to for further evaluation. 7. Additional incidental findings, as above. Electronically Signed   By: Vinnie Langton M.D.   On: 05/02/2019 18:44   MR 3D Recon At Scanner  Result Date: 04/26/2019 CLINICAL DATA:  Evaluate for choledocholithiasis. EXAM: MRI ABDOMEN WITHOUT AND WITH CONTRAST (INCLUDING MRCP) TECHNIQUE: Multiplanar multisequence MR imaging of the abdomen was performed both before and after the administration of intravenous contrast. Heavily T2-weighted images of the biliary and pancreatic ducts were obtained, and three-dimensional MRCP images were rendered by post processing. CONTRAST:  62mL GADAVIST GADOBUTROL 1 MMOL/ML IV SOLN COMPARISON:  CT AP 04/20/2019 FINDINGS: Lower chest: Trace bilateral pleural effusions. Hepatobiliary: Motion artifact diminishes exam detail within the liver. Corresponding to the CT abnormality is a focal area of increased T2 signal with restricted diffusion within segment 8/4. This measures 1.5 cm. A second more subtle area of increased T2 signal is noted within segment 7, image 12/3 and image 57/5. Enhancement characteristics of both of these lesions are difficult to assess due motion artifact. There is mild diffuse gallbladder wall thickening. Sludge is identified within the neck of the gallbladder. The common bile duct measures 1.1 cm in diameter. At least 4 stones are identified within the  distal common bile duct which measure up to 7 mm, image 18/8. Pancreas: Mild diffuse edema is identified. No main duct dilatation or mass identified. No pancreatic necrosis or pseudocyst formation. Spleen:  Within normal limits in size and appearance. Adrenals/Urinary Tract:  Normal appearance of the adrenal glands. No hydronephrosis identified bilaterally. Bilateral perinephric fat stranding is identified, nonspecific in may be related to prior insult. Several T2 hyperintense kidney lesions are identified. The largest arises from the posterior  cortex of the upper pole of left kidney measuring 4.5 cm and is compatible with a simple cyst. No solid enhancing mass or hydronephrosis identified bilaterally. No abnormal signal in or enhancement noted in the region of the recently characterized 10 mm right mid pole lesion. Stomach/Bowel: Visualized portions within the abdomen are unremarkable. Vascular/Lymphatic: Aortic atherosclerosis. No aneurysm. The portal vein remains patent. The splenic vein is also patent. No adenopathy Other:  No free fluid or fluid collections. Musculoskeletal: No suspicious bone lesions identified. IMPRESSION: 1. Exam is positive for choledocholithiasis. At least 4 stones are noted within the distal measuring up to 7 mm. There is increase caliber of the CBD which measures 1.1 cm in maximum dimension. 2. Gallbladder sludge and mild diffuse gallbladder wall thickening. Cannot rule out cholecystitis 3. Two areas of abnormal increased T2 signal are identified within segment 8/4 and segment 7. The enhancement characteristics of these lesions are difficult to assess due to abnormal motion artifact on the postcontrast images. As mentioned on exam from 04/20/2019 these are new when compared with 01/20/2019. Metastatic disease cannot be excluded. 4. Mild diffuse pancreatic edema compatible with uncomplicated acute pancreatitis. No pancreatic necrosis, main duct dilatation or pseudocyst identified at this time. 5. No MRI correlate to the suspicious right kidney lesion identified on recent CT. This remains indeterminate and may not be seen due to motion artifact on today's study. Recommend follow-up imaging after resolution of this acute episode. Ideally this should be performed as an outpatient when the patient is able to lay motionless and breath hold. 6. These results will be called to the ordering clinician or representative by the Radiologist Assistant, and communication documented in the PACS or [employer]. Electronically Signed   By:  Kerby Moors M.D.   On: 04/26/2019 19:40   NM PET Image Initial (PI) Skull Base To Thigh  Result Date: 05/20/2019 CLINICAL DATA:  Initial treatment strategy for non-small cell lung cancer. EXAM: NUCLEAR MEDICINE PET SKULL BASE TO THIGH TECHNIQUE: 9.94 mCi F-18 FDG was injected intravenously. Full-ring PET imaging was performed from the skull base to thigh after the radiotracer. CT data was obtained and used for attenuation correction and anatomic localization. Fasting blood glucose: 138 mg/dl COMPARISON:  CT abdomen and pelvis 05/02/2019, MR abdomen of 04/26/2019, CT chest of 04/20/2019. FINDINGS: Mediastinal blood pool activity: SUV max 1.96 Liver activity: SUV max NA NECK: No hypermetabolic lymph nodes in the neck. Incidental CT findings: None CHEST: Nodal disease and parenchymal abnormalities in the chest show hypermetabolic activity. (Image 68, series 4): Spiculated left upper lobe mass measuring 3 x 2.5 cm (SUVmax = 14.7) Anterior mediastinal adenopathy (image 60, series 4): 1.6 cm short axis dimension (SUVmax = 8.5) (Image 61, series 4): 16 mm right paratracheal lymph node (SUVmax = 8.6) Similar size and similar metabolic activity shown in a precarinal lymph node as well. Right upper lobe mass with irregular margins (image 19, series 8): 3.0 x 2.3 cm (SUVmax = 8.3) Mildly hypermetabolic right hilar lymph node less than a cm in  size (SUVmax = 5.1) Incidental CT findings: Atherosclerotic changes and coronary artery calcification. Moderate cardiac enlargement. Pulmonary emphysema. ABDOMEN/PELVIS: Focus of hypermetabolic change in the right hepatic lobe (image 104, series 4) 15 mm (SUVmax = 5.5) Tiny focus of increased metabolic activity along the capsule of the right hemi liver (image 115, series 3, no corresponding CT abnormality. Only slightly above background hepatic activity. Adrenal glands are normal. Incidental CT findings: Pneumobilia. Likely related to recent ERCP. Spleen, pancreas, adrenal glands  and kidneys without signs of acute abnormality. Cyst in the upper pole of the right kidney. Colonic diverticulosis without acute bowel process. Normal appendix. Calcified atherosclerotic changes throughout the abdominal aorta. Prostate is unremarkable by CT. SKELETON: No focal hypermetabolic activity to suggest skeletal metastasis. Extensive muscular uptake. Incidental CT findings: Spinal degenerative changes. No acute or destructive bone finding. IMPRESSION: Bilateral pulmonary masses compatible with known pulmonary neoplasm and associated with metastatic nodes in the chest and a focus of metastatic disease to the liver Second small focus along the right liver margin raising the question of additional site of metastasis to the liver, attention on follow-up. Aortic Atherosclerosis (ICD10-I70.0) and Emphysema (ICD10-J43.9). Electronically Signed   By: Zetta Bills M.D.   On: 05/20/2019 17:37   DG CHEST PORT 1 VIEW  Result Date: 04/28/2019 CLINICAL DATA:  Postop bronchoscopy. EXAM: PORTABLE CHEST 1 VIEW COMPARISON:  Chest radiograph and CT 04/20/2019 FINDINGS: No visualized pneumothorax post biopsy. Nodule in the left mid lung again seen. Nodular opacities in the right suprahilar region correspond to nodules on CT. Mild cardiomegaly. Unchanged mediastinal contours with aortic atherosclerosis. No significant pleural effusion. There is mild peribronchial thickening. IMPRESSION: 1. No evidence of pneumothorax post biopsy. 2. Left midlung nodule and right suprahilar nodules, as seen on recent CT. Aortic Atherosclerosis (ICD10-I70.0). Electronically Signed   By: Keith Rake M.D.   On: 04/28/2019 17:19   DG Chest Port 1 View  Result Date: 04/25/2019 CLINICAL DATA:  Chest pain and shortness of breath. EXAM: PORTABLE CHEST 1 VIEW COMPARISON:  Chest plain film and chest CT, dated April 20, 2019, are available for comparison. FINDINGS: A stable patchy area of increased opacification is again seen within the mid  left lung. A stable similar-appearing area is seen overlying the upper right lung. There is no evidence of a pleural effusion or pneumothorax. The heart size and mediastinal contours are within normal limits. The visualized skeletal structures are unremarkable. IMPRESSION: Stable patchy opacities within the mid left lung and right upper lobe, consistent with the bilateral lung masses seen within these regions on the prior chest CT dated April 20, 2019. Electronically Signed   By: Virgina Norfolk M.D.   On: 04/25/2019 18:09   DG ERCP BILIARY & PANCREATIC DUCTS  Result Date: 04/27/2019 CLINICAL DATA:  ERCP with sphincterotomy and balloon extraction. EXAM: ERCP TECHNIQUE: Multiple spot images obtained with the fluoroscopic device and submitted for interpretation post-procedure. COMPARISON:  MRCP - 04/26/2019 FLUOROSCOPY TIME:  4 minutes, 28 seconds FINDINGS: Twenty spot intraoperative fluoroscopic images of the right upper abdominal quadrant during ERCP are provided for review Initial image demonstrates an ERCP probe overlying the right upper abdominal quadrant Subsequent images demonstrate selective cannulation and opacification of the common bile duct which appears moderately dilated. There are 4 nonocclusive filling defects within the distal aspect of the CBD (image 4) compatible with choledocholithiasis demonstrated on preceding MRCP. Subsequent images demonstrate insufflation of a balloon within the central/mid aspect of the CBD with subsequent biliary sweeping and presumed sphincterotomy. There  is minimal opacification of the intrahepatic biliary tree which appears nondilated. There is no definitive opacification of either the cystic or pancreatic ducts. IMPRESSION: ERCP with findings of choledocholithiasis with subsequent biliary sweeping and sphincterotomy. These images were submitted for radiologic interpretation only. Please see the procedural report for the amount of contrast and the fluoroscopy time  utilized. Electronically Signed   By: Sandi Mariscal M.D.   On: 04/27/2019 11:32   DG Abd Portable 1V  Result Date: 05/01/2019 CLINICAL DATA:  Assess for constipation EXAM: PORTABLE ABDOMEN - 1 VIEW COMPARISON:  None. FINDINGS: A few mildly prominent loops of small bowel are seen in the left abdomen measuring up to 3 cm. Moderate fecal loading is seen throughout the colon. No other abnormalities are identified. IMPRESSION: Mildly prominent loops of small bowel in the left abdomen could represent mild ileus or early obstruction in the appropriate clinical setting. Moderate fecal loading in the colon. Electronically Signed   By: Dorise Bullion III M.D   On: 05/01/2019 12:20   MR ABDOMEN MRCP W WO CONTAST  Result Date: 04/26/2019 CLINICAL DATA:  Evaluate for choledocholithiasis. EXAM: MRI ABDOMEN WITHOUT AND WITH CONTRAST (INCLUDING MRCP) TECHNIQUE: Multiplanar multisequence MR imaging of the abdomen was performed both before and after the administration of intravenous contrast. Heavily T2-weighted images of the biliary and pancreatic ducts were obtained, and three-dimensional MRCP images were rendered by post processing. CONTRAST:  39mL GADAVIST GADOBUTROL 1 MMOL/ML IV SOLN COMPARISON:  CT AP 04/20/2019 FINDINGS: Lower chest: Trace bilateral pleural effusions. Hepatobiliary: Motion artifact diminishes exam detail within the liver. Corresponding to the CT abnormality is a focal area of increased T2 signal with restricted diffusion within segment 8/4. This measures 1.5 cm. A second more subtle area of increased T2 signal is noted within segment 7, image 12/3 and image 57/5. Enhancement characteristics of both of these lesions are difficult to assess due motion artifact. There is mild diffuse gallbladder wall thickening. Sludge is identified within the neck of the gallbladder. The common bile duct measures 1.1 cm in diameter. At least 4 stones are identified within the distal common bile duct which measure up to 7  mm, image 18/8. Pancreas: Mild diffuse edema is identified. No main duct dilatation or mass identified. No pancreatic necrosis or pseudocyst formation. Spleen:  Within normal limits in size and appearance. Adrenals/Urinary Tract:  Normal appearance of the adrenal glands. No hydronephrosis identified bilaterally. Bilateral perinephric fat stranding is identified, nonspecific in may be related to prior insult. Several T2 hyperintense kidney lesions are identified. The largest arises from the posterior cortex of the upper pole of left kidney measuring 4.5 cm and is compatible with a simple cyst. No solid enhancing mass or hydronephrosis identified bilaterally. No abnormal signal in or enhancement noted in the region of the recently characterized 10 mm right mid pole lesion. Stomach/Bowel: Visualized portions within the abdomen are unremarkable. Vascular/Lymphatic: Aortic atherosclerosis. No aneurysm. The portal vein remains patent. The splenic vein is also patent. No adenopathy Other:  No free fluid or fluid collections. Musculoskeletal: No suspicious bone lesions identified. IMPRESSION: 1. Exam is positive for choledocholithiasis. At least 4 stones are noted within the distal measuring up to 7 mm. There is increase caliber of the CBD which measures 1.1 cm in maximum dimension. 2. Gallbladder sludge and mild diffuse gallbladder wall thickening. Cannot rule out cholecystitis 3. Two areas of abnormal increased T2 signal are identified within segment 8/4 and segment 7. The enhancement characteristics of these lesions are difficult to  assess due to abnormal motion artifact on the postcontrast images. As mentioned on exam from 04/20/2019 these are new when compared with 01/20/2019. Metastatic disease cannot be excluded. 4. Mild diffuse pancreatic edema compatible with uncomplicated acute pancreatitis. No pancreatic necrosis, main duct dilatation or pseudocyst identified at this time. 5. No MRI correlate to the suspicious  right kidney lesion identified on recent CT. This remains indeterminate and may not be seen due to motion artifact on today's study. Recommend follow-up imaging after resolution of this acute episode. Ideally this should be performed as an outpatient when the patient is able to lay motionless and breath hold. 6. These results will be called to the ordering clinician or representative by the Radiologist Assistant, and communication documented in the PACS or [employer]. Electronically Signed   By: Kerby Moors M.D.   On: 04/26/2019 19:40   US Abdomen Limited RUQ  Result Date: 04/26/2019 CLINICAL DATA:  Pancreatitis.  Hypertension.  Diabetes. EXAM: ULTRASOUND ABDOMEN LIMITED RIGHT UPPER QUADRANT COMPARISON:  04/20/2019 abdominopelvic CT FINDINGS: Gallbladder: No gallstones or wall thickening visualized. No sonographic Murphy sign noted by sonographer. Common bile duct: Diameter: Dilated, including at 1.3 cm. compared to the recent CT, common duct stone or stones are present distally. Liver: Central right hepatic lobe hypoechoic lesion of 2.0 cm, as on CT. Portal vein is patent on color Doppler imaging with normal direction of blood flow towards the liver. Other: No ascites. IMPRESSION: 1. Normal gallbladder. 2. Biliary duct dilatation with choledocholithiasis. Consider further evaluation with ERCP. 3. Right hepatic lobe lesion as on CT. These results will be called to the ordering clinician or representative by the Radiologist Assistant, and communication documented in the PACS or [employer]. Electronically Signed   By: Abigail Miyamoto M.D.   On: 04/26/2019 07:16   DG C-ARM BRONCHOSCOPY  Result Date: 04/28/2019 C-ARM BRONCHOSCOPY: Fluoroscopy was utilized by the requesting physician.  No radiographic interpretation.    Labs:  CBC: Recent Labs    05/05/19 0422 05/06/19 0535 05/12/19 0943 05/19/19 0735  WBC 12.9* 9.5 6.3 5.2  HGB 7.5* 7.7* 9.3* 10.5*  HCT 22.4* 23.5* 28.7* 32.6*  PLT 240  262 340 260    COAGS: Recent Labs    04/26/19 0532 04/26/19 1429 04/27/19 0201 04/27/19 1824 04/28/19 0109  INR 1.2  --   --  1.2  --   APTT  --  38* 77* 46* 56*    BMP: Recent Labs    05/04/19 0345 05/05/19 0422 05/12/19 0943 05/19/19 0735  NA 131* 131* 136 139  K 4.4 4.3 4.6 4.4  CL 103 105 105 109  CO2 21* 21* 21* 18*  GLUCOSE 144* 133* 173* 221*  BUN 45* 22 10 12   CALCIUM 7.9* 7.8* 8.5* 8.6*  CREATININE 0.98 0.93 0.98 0.94  GFRNONAA >60 >60 >60 >60  GFRAA >60 >60 >60 >60    LIVER FUNCTION TESTS: Recent Labs    05/04/19 0345 05/05/19 0422 05/12/19 0943 05/19/19 0735  BILITOT 1.0 1.0 0.7 0.6  AST 48* 61* 39 25  ALT 65* 71* 51* 27  ALKPHOS 198* 216* 287* 215*  PROT 4.7* 4.8* 5.8* 6.2*  ALBUMIN 2.0* 2.1* 2.6* 2.8*    TUMOR MARKERS: No results for input(s): AFPTM, CEA, CA199, CHROMGRNA in the last 8760 hours.  Assessment and Plan: 86 y.o. male , ex smoker, with history of stage IV squamous cell carcinoma of the lung who initially presented with bilateral pulmonary nodules in addition to mediastinal lymphadenopathy and liver  metastasis in March 2021.  He is scheduled today for Port-A-Cath placement for palliative chemotherapy.Risks and benefits of image guided port-a-catheter placement was discussed with the patient including, but not limited to bleeding, infection, pneumothorax, or fibrin sheath development and need for additional procedures.  All of the patient's questions were answered, patient is agreeable to proceed. Consent signed and in chart.     Thank you for this interesting consult.  I greatly enjoyed meeting Dennis Stephens and look forward to participating in their care.  A copy of this report was sent to the requesting provider on this date.  Electronically Signed: D. Rowe Robert, PA-C 05/21/2019, 1:09 PM   I spent a total of  25 minutes   in face to face in clinical consultation, greater than 50% of which was counseling/coordinating care  for Port-A-Cath placement

## 2019-05-24 NOTE — Progress Notes (Signed)
[facility] [address] [place] [address]  DIAGNOSIS: Stage IV (T1c, N3, M1 C) non-small cell lung cancer, squamous cell carcinoma presented with bilateral pulmonary nodules in addition to mediastinal lymphadenopathy and liver metastasis diagnosed in March 2021.  PRIOR THERAPY: None  CURRENT THERAPY: Systemic chemotherapy with carboplatin for AUC of 5, paclitaxel 175 mg/M2 and Keytruda 200 mg IV every 3 weeks.  First dose May 19, 2019. Status post 1 cycle.   INTERVAL HISTORY: Dennis Stephens 86 y.o. male returns to the clinic today for a follow-up visit. The patient is feeling well today without any concerning complaints except he occasionally feels a sharp pain in his RUQ. He recently had a gallstone extraction. He denies any nausea, vomiting, diarrhea, or constipation. The patient underwent  well without any adverse effects except he is reporting bilateral anterior thigh numbness since Friday when he woke up from his port-a-cath placement. He denies any weakness in his legs, urinary retention/incontience, or bowel incontience. Denies any fever, chills, night sweats, or weight loss. Denies any chest pain, cough, or hemoptysis. He reports his baseline dyspnea on exertion. Denies any headache or visual changes but reports mild light headedness. His initial brain MRI was negative for metastatic disease to the brain. He also has been taking benadryl recently which may be contributing. Denies any rashes or skin changes. He recently had his initial staging PET scan performed. The patient is here today for evaluation and a 1 week follow up visit.    MEDICAL HISTORY: Past Medical History:  Diagnosis Date  . A-fib ([place] [place])   . DM2 (diabetes mellitus, type 2) ([facility])   . Hypertension     ALLERGIES:  has No Known Allergies.  MEDICATIONS:  Current Outpatient Medications  Medication Sig Dispense Refill  . amLODipine (NORVASC) 2.5 MG  tablet Take 2.5 mg by mouth daily.    Marland Kitchen apixaban (ELIQUIS) 5 MG TABS tablet Take 1 tablet (5 mg total) by mouth 2 (two) times daily. 60 tablet 2  . atorvastatin (LIPITOR) 40 MG tablet Take 40 mg by mouth daily.    . carvedilol (COREG) 12.5 MG tablet Take 12.5 mg by mouth 2 (two) times daily with a meal.    . doxazosin (CARDURA) 8 MG tablet Take 8 mg by mouth daily.    Marland Kitchen glimepiride (AMARYL) 2 MG tablet Take 1 tablet (2 mg total) by mouth daily with breakfast. 30 tablet 11  . lidocaine-prilocaine (EMLA) cream Apply to the Port-A-Cath site 30-60-minute before treatment. 30 g 1  . loratadine (CLARITIN) 10 MG tablet Take 1 tablet (10 mg total) by mouth daily as needed for allergies or rhinitis. 30 tablet 4  . losartan (COZAAR) 100 MG tablet Take 100 mg by mouth daily.    Marland Kitchen omeprazole (PRILOSEC) 20 MG capsule Take 20 mg by mouth daily.    . prochlorperazine (COMPAZINE) 10 MG tablet Take 1 tablet (10 mg total) by mouth every 6 (six) hours as needed for nausea or vomiting. 30 tablet 0  . sertraline (ZOLOFT) 100 MG tablet Take 100 mg by mouth daily.    . temazepam (RESTORIL) 30 MG capsule Take 30 mg by mouth at bedtime as needed for sleep.     No current facility-administered medications for this visit.    SURGICAL HISTORY:  Past Surgical History:  Procedure Laterality Date  . BRONCHIAL BIOPSY  04/28/2019   Procedure: BRONCHIAL BIOPSIES;  Surgeon: Garner Nash, DO;  Location: [facility];  Service: Pulmonary;;  . BRONCHIAL BRUSHINGS  04/28/2019   Procedure: BRONCHIAL BRUSHINGS;  Surgeon: Garner Nash, DO;  Location: [facility];  Service: Pulmonary;;  . BRONCHIAL NEEDLE ASPIRATION BIOPSY  04/28/2019   Procedure: BRONCHIAL NEEDLE ASPIRATION BIOPSIES;  Surgeon: Garner Nash, DO;  Location: [facility];  Service: Pulmonary;;  . BRONCHIAL WASHINGS  04/28/2019   Procedure: BRONCHIAL WASHINGS;  Surgeon: Garner Nash, DO;  Location: [facility];  Service: Pulmonary;;  . ENDOBRONCHIAL  ULTRASOUND N/A 04/28/2019   Procedure: ENDOBRONCHIAL ULTRASOUND;  Surgeon: Garner Nash, DO;  Location: [facility];  Service: Pulmonary;  Laterality: N/A;  . ERCP N/A 04/27/2019   Procedure: ENDOSCOPIC RETROGRADE CHOLANGIOPANCREATOGRAPHY (ERCP);  Surgeon: Irene Shipper, MD;  Location: [facility];  Service: Endoscopy;  Laterality: N/A;  . ESOPHAGOGASTRODUODENOSCOPY (EGD) WITH PROPOFOL N/A 05/04/2019   Procedure: ESOPHAGOGASTRODUODENOSCOPY (EGD) WITH PROPOFOL;  Surgeon: Irene Shipper, MD;  Location: [facility];  Service: Endoscopy;  Laterality: N/A;  . IR IMAGING GUIDED PORT INSERTION  05/21/2019  . LIPOMA EXCISION    . REMOVAL OF STONES  04/27/2019   Procedure: REMOVAL OF STONES;  Surgeon: Irene Shipper, MD;  Location: [facility];  Service: Endoscopy;;  . Joan Mayans  04/27/2019   Procedure: Joan Mayans;  Surgeon: Irene Shipper, MD;  Location: [facility];  Service: Endoscopy;;  . VIDEO BRONCHOSCOPY WITH ENDOBRONCHIAL NAVIGATION N/A 04/28/2019   Procedure: VIDEO BRONCHOSCOPY WITH ENDOBRONCHIAL NAVIGATION;  Surgeon: Garner Nash, DO;  Location: [facility];  Service: Pulmonary;  Laterality: N/A;    REVIEW OF SYSTEMS:   Review of Systems  Constitutional: Positive for decreased appetite. Negative for chills, fatigue, fever and unexpected weight change.  HENT: Negative for mouth sores, nosebleeds, sore throat and trouble swallowing.   Eyes: Negative for eye problems and icterus.  Respiratory: Positive for baseline dyspnea on exertion. Negative for cough, hemoptysis, and wheezing.   Cardiovascular: Negative for chest pain and leg swelling.  Gastrointestinal: Positive for intermittent right upper quadrant pain. Negative for  constipation, diarrhea, nausea and vomiting.  Genitourinary: Negative for bladder incontinence, difficulty urinating, dysuria, frequency and hematuria.   Musculoskeletal: Negative for back pain, gait problem, neck pain and neck stiffness.  Skin: Negative for  itching and rash.  Neurological: Positive for mild light headedness. Positive for bilateral anterior thigh numbness. Negative for dizziness, extremity weakness, gait problem, headaches, light-headedness and seizures.  Hematological: Negative for adenopathy. Does not bruise/bleed easily.  Psychiatric/Behavioral: Negative for confusion, depression and sleep disturbance. The patient is not nervous/anxious.     PHYSICAL EXAMINATION:  Blood pressure 137/71, pulse (!) 103, temperature 97.8 F (36.6 C), temperature source Oral, resp. rate 20, height 5\' 8"  (1.727 m), weight 202 lb 12.8 oz (92 kg), SpO2 97 %.  ECOG PERFORMANCE STATUS: 1 - Symptomatic but completely ambulatory  Physical Exam  Constitutional: Oriented to person, place, and time and well-developed, well-nourished, and in no distress.  HENT:  Head: Normocephalic and atraumatic.  Mouth/Throat: Oropharynx is clear and moist. No oropharyngeal exudate.  Eyes: Conjunctivae are normal. Right eye exhibits no discharge. Left eye exhibits no discharge. No scleral icterus.  Neck: Normal range of motion. Neck supple.  Cardiovascular: Normal rate, irregular rhythm, normal heart sounds and intact distal pulses.   Pulmonary/Chest: Effort normal and breath sounds normal. No respiratory distress. No wheezes. No rales.  Abdominal: Soft. Bowel sounds are normal. Exhibits no distension and no mass. There is no tenderness.  Musculoskeletal: Normal range of motion. Exhibits no edema.  Lymphadenopathy:  No cervical adenopathy.  Neurological: Alert and oriented to person, place, and time. Exhibits normal muscle tone. Normal strength bilaterally in lower extremities. Numbness on anterior thigh.  Skin: Skin is warm and dry. No rash noted. Not diaphoretic. No erythema. No pallor.  Psychiatric: Mood, memory and judgment normal.  Vitals reviewed.  LABORATORY DATA: Lab Results  Component Value Date   WBC 14.6 (H) 05/26/2019   HGB 10.7 (L) 05/26/2019    HCT 33.5 (L) 05/26/2019   MCV 97.1 05/26/2019   PLT 196 05/26/2019      Chemistry      Component Value Date/Time   NA 140 05/26/2019 1437   K 4.5 05/26/2019 1437   CL 106 05/26/2019 1437   CO2 22 05/26/2019 1437   BUN 12 05/26/2019 1437   CREATININE 1.02 05/26/2019 1437      Component Value Date/Time   CALCIUM 8.9 05/26/2019 1437   ALKPHOS 233 (H) 05/26/2019 1437   AST 22 05/26/2019 1437   ALT 20 05/26/2019 1437   BILITOT 0.4 05/26/2019 1437       RADIOGRAPHIC STUDIES:  CT ABDOMEN PELVIS W CONTRAST  Result Date: 05/02/2019 CLINICAL DATA:  86 year old male with history of nausea and vomiting. Suspected bowel obstruction or abdominal abscess/infection. EXAM: CT ABDOMEN AND PELVIS WITH CONTRAST TECHNIQUE: Multidetector CT imaging of the abdomen and pelvis was performed using the standard protocol following bolus administration of intravenous contrast. CONTRAST:  159mL OMNIPAQUE IOHEXOL 300 MG/ML  SOLN COMPARISON:  CT the abdomen and pelvis 04/20/2019. FINDINGS: Lower chest: Trace bilateral pleural effusions lying dependently. Atherosclerotic calcifications in the descending thoracic aorta as well as the right coronary artery. Mild fibrotic changes in the lung bases bilaterally, suggestive of nonspecific interstitial pneumonia. Hepatobiliary: Ill-defined hypovascular areas in the liver, best demonstrated by a 1.6 x 1.5 cm lesion in segment 4B (axial image 21 of series 2), similar to the recent prior examination. No intra or extrahepatic biliary ductal dilatation. Gallbladder is nearly completely decompressed, but the gallbladder wall appears thickened with avid enhancement of the gallbladder mucosa and mild diffuse gallbladder wall edema and subtle surrounding inflammatory changes. Pancreas: No pancreatic mass. No pancreatic ductal dilatation. No pancreatic or peripancreatic fluid collections or inflammatory changes. Spleen: Unremarkable. Adrenals/Urinary Tract: Low-attenuation lesions in  the right kidney, compatible with simple cysts, largest of which is an exophytic lesion in the upper pole measuring 4.8 cm. Other subcentimeter low-attenuation lesions in the kidneys bilaterally, too small to characterize, but statistically likely to represent tiny cysts. Bilateral adrenal glands are normal in appearance. No hydroureteronephrosis. Urinary bladder is normal in appearance. Stomach/Bowel: Normal appearance of the stomach. No pathologic dilatation of small bowel or colon. Numerous colonic diverticulae are noted, particularly in the descending colon and sigmoid colon, without surrounding inflammatory changes to suggest an acute diverticulitis at this time. Normal appendix. Vascular/Lymphatic: Aortic atherosclerosis. 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 No lymphadenopathy noted in the abdomen or pelvis. No lymphadenopathy noted in the abdomen or pelvis. Reproductive: Prostate gland and seminal vesicles are unremarkable in appearance. Other: No significant volume of ascites.  No pneumoperitoneum. Musculoskeletal: There are no aggressive appearing lytic or blastic lesions noted in the visualized portions of the skeleton. IMPRESSION: 1. Although the gallbladder is nearly decompressed, and there are no definite calcified gallstones, the gallbladder wall appears thickened and edematous with hyperenhancement and subtle surrounding inflammatory changes. Clinical correlation for signs and symptoms  of a calculus cholecystitis is suggested. 2. Ill-defined hepatic lesions again noted, the possibility of metastatic disease should be considered. This could be further evaluated with nonemergent abdominal MRI with and without IV gadolinium if clinically appropriate. 3. Colonic diverticulosis without evidence of acute diverticulitis at this time. 4. Aortic atherosclerosis with small aneurysm of the right common femoral artery  measuring 1.9 x 1.2 x 0.9 cm. 5. Trace bilateral pleural effusions lying dependently. 6. The appearance of the lung bases suggests interstitial lung disease such as nonspecific interstitial pneumonia (NSIP). Outpatient referral to Pulmonology should be considered in the near future to for further evaluation. 7. Additional incidental findings, as above. Electronically Signed   By: Vinnie Langton M.D.   On: 05/02/2019 18:44   MR 3D Recon At Scanner  Result Date: 04/26/2019 CLINICAL DATA:  Evaluate for choledocholithiasis. EXAM: MRI ABDOMEN WITHOUT AND WITH CONTRAST (INCLUDING MRCP) TECHNIQUE: Multiplanar multisequence MR imaging of the abdomen was performed both before and after the administration of intravenous contrast. Heavily T2-weighted images of the biliary and pancreatic ducts were obtained, and three-dimensional MRCP images were rendered by post processing. CONTRAST:  46mL GADAVIST GADOBUTROL 1 MMOL/ML IV SOLN COMPARISON:  CT AP 04/20/2019 FINDINGS: Lower chest: Trace bilateral pleural effusions. Hepatobiliary: Motion artifact diminishes exam detail within the liver. Corresponding to the CT abnormality is a focal area of increased T2 signal with restricted diffusion within segment 8/4. This measures 1.5 cm. A second more subtle area of increased T2 signal is noted within segment 7, image 12/3 and image 57/5. Enhancement characteristics of both of these lesions are difficult to assess due motion artifact. There is mild diffuse gallbladder wall thickening. Sludge is identified within the neck of the gallbladder. The common bile duct measures 1.1 cm in diameter. At least 4 stones are identified within the distal common bile duct which measure up to 7 mm, image 18/8. Pancreas: Mild diffuse edema is identified. No main duct dilatation or mass identified. No pancreatic necrosis or pseudocyst formation. Spleen:  Within normal limits in size and appearance. Adrenals/Urinary Tract:  Normal appearance of the  adrenal glands. No hydronephrosis identified bilaterally. Bilateral perinephric fat stranding is identified, nonspecific in may be related to prior insult. Several T2 hyperintense kidney lesions are identified. The largest arises from the posterior cortex of the upper pole of left kidney measuring 4.5 cm and is compatible with a simple cyst. No solid enhancing mass or hydronephrosis identified bilaterally. No abnormal signal in or enhancement noted in the region of the recently characterized 10 mm right mid pole lesion. Stomach/Bowel: Visualized portions within the abdomen are unremarkable. Vascular/Lymphatic: Aortic atherosclerosis. No aneurysm. The portal vein remains patent. The splenic vein is also patent. No adenopathy Other:  No free fluid or fluid collections. Musculoskeletal: No suspicious bone lesions identified. IMPRESSION: 1. Exam is positive for choledocholithiasis. At least 4 stones are noted within the distal measuring up to 7 mm. There is increase caliber of the CBD which measures 1.1 cm in maximum dimension. 2. Gallbladder sludge and mild diffuse gallbladder wall thickening. Cannot rule out cholecystitis 3. Two areas of abnormal increased T2 signal are identified within segment 8/4 and segment 7. The enhancement characteristics of these lesions are difficult to assess due to abnormal motion artifact on the postcontrast images. As mentioned on exam from 04/20/2019 these are new when compared with 01/20/2019. Metastatic disease cannot be excluded. 4. Mild diffuse pancreatic edema compatible with uncomplicated acute pancreatitis. No pancreatic necrosis, main duct dilatation or pseudocyst identified at this  time. 5. No MRI correlate to the suspicious right kidney lesion identified on recent CT. This remains indeterminate and may not be seen due to motion artifact on today's study. Recommend follow-up imaging after resolution of this acute episode. Ideally this should be performed as an outpatient when  the patient is able to lay motionless and breath hold. 6. These results will be called to the ordering clinician or representative by the Radiologist Assistant, and communication documented in the PACS or [employer]. Electronically Signed   By: Kerby Moors M.D.   On: 04/26/2019 19:40   NM PET Image Initial (PI) Skull Base To Thigh  Result Date: 05/20/2019 CLINICAL DATA:  Initial treatment strategy for non-small cell lung cancer. EXAM: NUCLEAR MEDICINE PET SKULL BASE TO THIGH TECHNIQUE: 9.94 mCi F-18 FDG was injected intravenously. Full-ring PET imaging was performed from the skull base to thigh after the radiotracer. CT data was obtained and used for attenuation correction and anatomic localization. Fasting blood glucose: 138 mg/dl COMPARISON:  CT abdomen and pelvis 05/02/2019, MR abdomen of 04/26/2019, CT chest of 04/20/2019. FINDINGS: Mediastinal blood pool activity: SUV max 1.96 Liver activity: SUV max NA NECK: No hypermetabolic lymph nodes in the neck. Incidental CT findings: None CHEST: Nodal disease and parenchymal abnormalities in the chest show hypermetabolic activity. (Image 68, series 4): Spiculated left upper lobe mass measuring 3 x 2.5 cm (SUVmax = 14.7) Anterior mediastinal adenopathy (image 60, series 4): 1.6 cm short axis dimension (SUVmax = 8.5) (Image 61, series 4): 16 mm right paratracheal lymph node (SUVmax = 8.6) Similar size and similar metabolic activity shown in a precarinal lymph node as well. Right upper lobe mass with irregular margins (image 19, series 8): 3.0 x 2.3 cm (SUVmax = 8.3) Mildly hypermetabolic right hilar lymph node less than a cm in size (SUVmax = 5.1) Incidental CT findings: Atherosclerotic changes and coronary artery calcification. Moderate cardiac enlargement. Pulmonary emphysema. ABDOMEN/PELVIS: Focus of hypermetabolic change in the right hepatic lobe (image 104, series 4) 15 mm (SUVmax = 5.5) Tiny focus of increased metabolic activity along the capsule of the  right hemi liver (image 115, series 3, no corresponding CT abnormality. Only slightly above background hepatic activity. Adrenal glands are normal. Incidental CT findings: Pneumobilia. Likely related to recent ERCP. Spleen, pancreas, adrenal glands and kidneys without signs of acute abnormality. Cyst in the upper pole of the right kidney. Colonic diverticulosis without acute bowel process. Normal appendix. Calcified atherosclerotic changes throughout the abdominal aorta. Prostate is unremarkable by CT. SKELETON: No focal hypermetabolic activity to suggest skeletal metastasis. Extensive muscular uptake. Incidental CT findings: Spinal degenerative changes. No acute or destructive bone finding. IMPRESSION: Bilateral pulmonary masses compatible with known pulmonary neoplasm and associated with metastatic nodes in the chest and a focus of metastatic disease to the liver Second small focus along the right liver margin raising the question of additional site of metastasis to the liver, attention on follow-up. Aortic Atherosclerosis (ICD10-I70.0) and Emphysema (ICD10-J43.9). Electronically Signed   By: Zetta Bills M.D.   On: 05/20/2019 17:37   DG CHEST PORT 1 VIEW  Result Date: 04/28/2019 CLINICAL DATA:  Postop bronchoscopy. EXAM: PORTABLE CHEST 1 VIEW COMPARISON:  Chest radiograph and CT 04/20/2019 FINDINGS: No visualized pneumothorax post biopsy. Nodule in the left mid lung again seen. Nodular opacities in the right suprahilar region correspond to nodules on CT. Mild cardiomegaly. Unchanged mediastinal contours with aortic atherosclerosis. No significant pleural effusion. There is mild peribronchial thickening. IMPRESSION: 1. No evidence of pneumothorax post  biopsy. 2. Left midlung nodule and right suprahilar nodules, as seen on recent CT. Aortic Atherosclerosis (ICD10-I70.0). Electronically Signed   By: Keith Rake M.D.   On: 04/28/2019 17:19   DG ERCP BILIARY & PANCREATIC DUCTS  Result Date:  04/27/2019 CLINICAL DATA:  ERCP with sphincterotomy and balloon extraction. EXAM: ERCP TECHNIQUE: Multiple spot images obtained with the fluoroscopic device and submitted for interpretation post-procedure. COMPARISON:  MRCP - 04/26/2019 FLUOROSCOPY TIME:  4 minutes, 28 seconds FINDINGS: Twenty spot intraoperative fluoroscopic images of the right upper abdominal quadrant during ERCP are provided for review Initial image demonstrates an ERCP probe overlying the right upper abdominal quadrant Subsequent images demonstrate selective cannulation and opacification of the common bile duct which appears moderately dilated. There are 4 nonocclusive filling defects within the distal aspect of the CBD (image 4) compatible with choledocholithiasis demonstrated on preceding MRCP. Subsequent images demonstrate insufflation of a balloon within the central/mid aspect of the CBD with subsequent biliary sweeping and presumed sphincterotomy. There is minimal opacification of the intrahepatic biliary tree which appears nondilated. There is no definitive opacification of either the cystic or pancreatic ducts. IMPRESSION: ERCP with findings of choledocholithiasis with subsequent biliary sweeping and sphincterotomy. These images were submitted for radiologic interpretation only. Please see the procedural report for the amount of contrast and the fluoroscopy time utilized. Electronically Signed   By: Sandi Mariscal M.D.   On: 04/27/2019 11:32   DG Abd Portable 1V  Result Date: 05/01/2019 CLINICAL DATA:  Assess for constipation EXAM: PORTABLE ABDOMEN - 1 VIEW COMPARISON:  None. FINDINGS: A few mildly prominent loops of small bowel are seen in the left abdomen measuring up to 3 cm. Moderate fecal loading is seen throughout the colon. No other abnormalities are identified. IMPRESSION: Mildly prominent loops of small bowel in the left abdomen could represent mild ileus or early obstruction in the appropriate clinical setting. Moderate fecal  loading in the colon. Electronically Signed   By: Dorise Bullion III M.D   On: 05/01/2019 12:20   MR ABDOMEN MRCP W WO CONTAST  Result Date: 04/26/2019 CLINICAL DATA:  Evaluate for choledocholithiasis. EXAM: MRI ABDOMEN WITHOUT AND WITH CONTRAST (INCLUDING MRCP) TECHNIQUE: Multiplanar multisequence MR imaging of the abdomen was performed both before and after the administration of intravenous contrast. Heavily T2-weighted images of the biliary and pancreatic ducts were obtained, and three-dimensional MRCP images were rendered by post processing. CONTRAST:  50mL GADAVIST GADOBUTROL 1 MMOL/ML IV SOLN COMPARISON:  CT AP 04/20/2019 FINDINGS: Lower chest: Trace bilateral pleural effusions. Hepatobiliary: Motion artifact diminishes exam detail within the liver. Corresponding to the CT abnormality is a focal area of increased T2 signal with restricted diffusion within segment 8/4. This measures 1.5 cm. A second more subtle area of increased T2 signal is noted within segment 7, image 12/3 and image 57/5. Enhancement characteristics of both of these lesions are difficult to assess due motion artifact. There is mild diffuse gallbladder wall thickening. Sludge is identified within the neck of the gallbladder. The common bile duct measures 1.1 cm in diameter. At least 4 stones are identified within the distal common bile duct which measure up to 7 mm, image 18/8. Pancreas: Mild diffuse edema is identified. No main duct dilatation or mass identified. No pancreatic necrosis or pseudocyst formation. Spleen:  Within normal limits in size and appearance. Adrenals/Urinary Tract:  Normal appearance of the adrenal glands. No hydronephrosis identified bilaterally. Bilateral perinephric fat stranding is identified, nonspecific in may be related to prior insult. Several T2  hyperintense kidney lesions are identified. The largest arises from the posterior cortex of the upper pole of left kidney measuring 4.5 cm and is compatible with a  simple cyst. No solid enhancing mass or hydronephrosis identified bilaterally. No abnormal signal in or enhancement noted in the region of the recently characterized 10 mm right mid pole lesion. Stomach/Bowel: Visualized portions within the abdomen are unremarkable. Vascular/Lymphatic: Aortic atherosclerosis. No aneurysm. The portal vein remains patent. The splenic vein is also patent. No adenopathy Other:  No free fluid or fluid collections. Musculoskeletal: No suspicious bone lesions identified. IMPRESSION: 1. Exam is positive for choledocholithiasis. At least 4 stones are noted within the distal measuring up to 7 mm. There is increase caliber of the CBD which measures 1.1 cm in maximum dimension. 2. Gallbladder sludge and mild diffuse gallbladder wall thickening. Cannot rule out cholecystitis 3. Two areas of abnormal increased T2 signal are identified within segment 8/4 and segment 7. The enhancement characteristics of these lesions are difficult to assess due to abnormal motion artifact on the postcontrast images. As mentioned on exam from 04/20/2019 these are new when compared with 01/20/2019. Metastatic disease cannot be excluded. 4. Mild diffuse pancreatic edema compatible with uncomplicated acute pancreatitis. No pancreatic necrosis, main duct dilatation or pseudocyst identified at this time. 5. No MRI correlate to the suspicious right kidney lesion identified on recent CT. This remains indeterminate and may not be seen due to motion artifact on today's study. Recommend follow-up imaging after resolution of this acute episode. Ideally this should be performed as an outpatient when the patient is able to lay motionless and breath hold. 6. These results will be called to the ordering clinician or representative by the Radiologist Assistant, and communication documented in the PACS or [employer]. Electronically Signed   By: Kerby Moors M.D.   On: 04/26/2019 19:40   IR IMAGING GUIDED PORT  INSERTION  Result Date: 05/21/2019 INDICATION: 86 year old male with a history of lung cancer in need of durable venous access for chemotherapy. EXAM: IMPLANTED PORT A CATH PLACEMENT WITH ULTRASOUND AND FLUOROSCOPIC GUIDANCE MEDICATIONS: 1 g Ancef; The antibiotic was administered within an appropriate time interval prior to skin puncture. ANESTHESIA/SEDATION: Versed 2 mg IV; Fentanyl 100 mcg IV; Moderate Sedation Time:  18 minutes The patient was continuously monitored during the procedure by the interventional radiology nurse under my direct supervision. FLUOROSCOPY TIME:  0 minutes, 24 seconds (7 mGy) COMPLICATIONS: None immediate. PROCEDURE: The right neck and chest was prepped with chlorhexidine, and draped in the usual sterile fashion using maximum barrier technique (cap and mask, sterile gown, sterile gloves, large sterile sheet, hand hygiene and cutaneous antiseptic). Local anesthesia was attained by infiltration with 1% lidocaine with epinephrine. Ultrasound demonstrated patency of the right internal jugular vein, and this was documented with an image. Under real-time ultrasound guidance, this vein was accessed with a 21 gauge micropuncture needle and image documentation was performed. A small dermatotomy was made at the access site with an 11 scalpel. A 0.018" wire was advanced into the SVC and the access needle exchanged for a 55F micropuncture vascular sheath. The 0.018" wire was then removed and a 0.035" wire advanced into the IVC. An appropriate location for the subcutaneous reservoir was selected below the clavicle and an incision was made through the skin and underlying soft tissues. The subcutaneous tissues were then dissected using a combination of blunt and sharp surgical technique and a pocket was formed. A single lumen power injectable portacatheter was then tunneled through  the subcutaneous tissues from the pocket to the dermatotomy and the port reservoir placed within the subcutaneous pocket.  The venous access site was then serially dilated and a peel away vascular sheath placed over the wire. The wire was removed and the port catheter advanced into position under fluoroscopic guidance. The catheter tip is positioned in the superior cavoatrial junction. This was documented with a spot image. The portacatheter was then tested and found to flush and aspirate well. The port was flushed with saline followed by 100 units/mL heparinized saline. The pocket was then closed in two layers using first subdermal inverted interrupted absorbable sutures followed by a running subcuticular suture. The epidermis was then sealed with Dermabond. The dermatotomy at the venous access site was also closed with Dermabond. IMPRESSION: Successful placement of a right IJ approach Power Port with ultrasound and fluoroscopic guidance. The catheter is ready for use. Electronically Signed   By: Jacqulynn Cadet M.D.   On: 05/21/2019 15:53   DG C-ARM BRONCHOSCOPY  Result Date: 04/28/2019 C-ARM BRONCHOSCOPY: Fluoroscopy was utilized by the requesting physician.  No radiographic interpretation.     ASSESSMENT/PLAN:  This is a very pleasant 86 year old Caucasian male recently diagnosed with stage IV (T1c, and 3, and 1C) non-small cell lung cancer, squamous cell carcinoma.  He presented with bilateral pulmonary nodules in addition to bilateral mediastinal lymphadenopathy and suspicious liver metastases.  He was diagnosed in March 2021.   The patient is currently undergoing treatment with systemic chemotherapy with carboplatin for an AUC of 5, paclitaxel 175 mg/m, Keytruda 200 mg IV every 3 weeks with Neulasta support.  He is status post 1 cycle and he tolerated it well.   The patient was seen with Dr. Julien Nordmann today.  Labs were reviewed as well as his initial staging PET scan.  Dr. Julien Nordmann recommends that he continue on the same treatment at the same dose.   We will see him back for follow-up visit in 2 weeks for  evaluation before starting cycle #3.  Discussed his anterior thigh numbness with Dr. Julien Nordmann who believes this may be secondary to his neulasta injection. Will monitor for now.   Patient advised to hydrate and to avoid taking benadryl which may be contributing to his dizziness. Advised to take Claritin instead.   The patient was advised to call immediately if he has any concerning symptoms in the interval. The patient voices understanding of current disease status and treatment options and is in agreement with the current care plan. All questions were answered. The patient knows to call the clinic with any problems, questions or concerns. We can certainly see the patient much sooner if necessary      No orders of the defined types were placed in this encounter.    Diogenes Whirley L Kytzia Gienger, PA-C 05/26/19  ADDENDUM: Hematology/Oncology Attending: I had a face-to-face encounter with the patient today.  I recommended his care plan.  This is a very pleasant 86 years old white male recently diagnosed with metastatic non-small cell lung cancer, squamous cell carcinoma.  The patient had a PET scan performed recently.  I personally and independently reviewed the scan images and discussed the result and showed the images to the patient.  He has a scan showed the bilateral pulmonary nodules in addition to mediastinal lymphadenopathy and metastatic liver lesion. The patient started treatment with systemic chemotherapy with carboplatin for AUC of 5, paclitaxel 175 mg/M2 and Keytruda 200 mg IV every 3 weeks with Neulasta support status post 1 cycle last  week. He tolerated the first cycle of his treatment well except for mild numbness in the anterior part of his thighs bilaterally after the Port-A-Cath placement.  This could be incidental finding from the chemotherapy as well as the Neulasta injection. I recommended for the patient to continue with the same treatment regimen for now. We will see him back  for follow-up visit in 2 weeks for evaluation before starting cycle #2 of his treatment. The patient was advised to call immediately if he has any concerning symptoms in the interval.  Disclaimer: This note was dictated with voice recognition software. Similar sounding words can inadvertently be transcribed and may be missed upon review. Eilleen Kempf, MD 05/26/19

## 2019-05-25 ENCOUNTER — Other Ambulatory Visit: Payer: Self-pay | Admitting: *Deleted

## 2019-05-25 DIAGNOSIS — C3491 Malignant neoplasm of unspecified part of right bronchus or lung: Secondary | ICD-10-CM

## 2019-05-26 ENCOUNTER — Inpatient Hospital Stay (HOSPITAL_BASED_OUTPATIENT_CLINIC_OR_DEPARTMENT_OTHER): Payer: No Typology Code available for payment source | Admitting: Physician Assistant

## 2019-05-26 ENCOUNTER — Inpatient Hospital Stay: Payer: No Typology Code available for payment source

## 2019-05-26 ENCOUNTER — Encounter: Payer: Self-pay | Admitting: Physician Assistant

## 2019-05-26 ENCOUNTER — Other Ambulatory Visit: Payer: Self-pay

## 2019-05-26 ENCOUNTER — Encounter: Payer: Self-pay | Admitting: Internal Medicine

## 2019-05-26 VITALS — BP 137/71 | HR 103 | Temp 97.8°F | Resp 20 | Ht 68.0 in | Wt 202.8 lb

## 2019-05-26 DIAGNOSIS — Z5111 Encounter for antineoplastic chemotherapy: Secondary | ICD-10-CM | POA: Diagnosis not present

## 2019-05-26 DIAGNOSIS — C3491 Malignant neoplasm of unspecified part of right bronchus or lung: Secondary | ICD-10-CM | POA: Diagnosis not present

## 2019-05-26 DIAGNOSIS — Z95828 Presence of other vascular implants and grafts: Secondary | ICD-10-CM

## 2019-05-26 LAB — CMP (CANCER CENTER ONLY)
ALT: 20 U/L (ref 0–44)
AST: 22 U/L (ref 15–41)
Albumin: 3 g/dL — ABNORMAL LOW (ref 3.5–5.0)
Alkaline Phosphatase: 233 U/L — ABNORMAL HIGH (ref 38–126)
Anion gap: 12 (ref 5–15)
BUN: 12 mg/dL (ref 8–23)
CO2: 22 mmol/L (ref 22–32)
Calcium: 8.9 mg/dL (ref 8.9–10.3)
Chloride: 106 mmol/L (ref 98–111)
Creatinine: 1.02 mg/dL (ref 0.61–1.24)
GFR, Est AFR Am: >60 mL/min (ref >60)
GFR, Estimated: >60 mL/min (ref >60)
Glucose, Bld: 142 mg/dL — ABNORMAL HIGH (ref 70–99)
Potassium: 4.5 mmol/L (ref 3.5–5.1)
Sodium: 140 mmol/L (ref 135–145)
Total Bilirubin: 0.4 mg/dL (ref 0.3–1.2)
Total Protein: 6.2 g/dL — ABNORMAL LOW (ref 6.5–8.1)

## 2019-05-26 LAB — CBC WITH DIFFERENTIAL (CANCER CENTER ONLY)
Abs Immature Granulocytes: 0.71 10*3/uL — ABNORMAL HIGH (ref 0.00–0.07)
Basophils Absolute: 0.1 10*3/uL (ref 0.0–0.1)
Basophils Relative: 1 %
Eosinophils Absolute: 0.4 10*3/uL (ref 0.0–0.5)
Eosinophils Relative: 3 %
HCT: 33.5 % — ABNORMAL LOW (ref 39.0–52.0)
Hemoglobin: 10.7 g/dL — ABNORMAL LOW (ref 13.0–17.0)
Immature Granulocytes: 5 %
Lymphocytes Relative: 13 %
Lymphs Abs: 1.9 10*3/uL (ref 0.7–4.0)
MCH: 31 pg (ref 26.0–34.0)
MCHC: 31.9 g/dL (ref 30.0–36.0)
MCV: 97.1 fL (ref 80.0–100.0)
Monocytes Absolute: 1.6 10*3/uL — ABNORMAL HIGH (ref 0.1–1.0)
Monocytes Relative: 11 %
Neutro Abs: 9.8 10*3/uL — ABNORMAL HIGH (ref 1.7–7.7)
Neutrophils Relative %: 67 %
Platelet Count: 196 10*3/uL (ref 150–400)
RBC: 3.45 MIL/uL — ABNORMAL LOW (ref 4.22–5.81)
RDW: 15 % (ref 11.5–15.5)
WBC Count: 14.6 10*3/uL — ABNORMAL HIGH (ref 4.0–10.5)
nRBC: 0 % (ref 0.0–0.2)

## 2019-05-26 MED ORDER — SODIUM CHLORIDE 0.9% FLUSH
10.0000 mL | INTRAVENOUS | Status: DC | PRN
  Filled 2019-05-26: qty 10

## 2019-05-26 MED ORDER — HEPARIN SOD (PORK) LOCK FLUSH 100 UNIT/ML IV SOLN
500.0000 [IU] | Freq: Once | INTRAVENOUS | Status: DC
  Filled 2019-05-26: qty 5

## 2019-05-26 NOTE — Progress Notes (Signed)
Called pt to introduce myself as his Arboriculturist and to discuss the [employer].  Pt has 2 insurances so copay assistance shouldn't be needed.  I wasn't able to leave a msg because mailbox was full.  I will try and see pt in person.

## 2019-05-27 LAB — TSH: TSH: 1.061 u[IU]/mL (ref 0.320–4.118)

## 2019-06-02 ENCOUNTER — Inpatient Hospital Stay: Payer: No Typology Code available for payment source

## 2019-06-02 ENCOUNTER — Inpatient Hospital Stay: Payer: No Typology Code available for payment source | Admitting: Pulmonary Disease

## 2019-06-02 ENCOUNTER — Other Ambulatory Visit: Payer: Self-pay

## 2019-06-02 DIAGNOSIS — Z95828 Presence of other vascular implants and grafts: Secondary | ICD-10-CM

## 2019-06-02 DIAGNOSIS — Z5111 Encounter for antineoplastic chemotherapy: Secondary | ICD-10-CM | POA: Diagnosis not present

## 2019-06-02 DIAGNOSIS — C3491 Malignant neoplasm of unspecified part of right bronchus or lung: Secondary | ICD-10-CM

## 2019-06-02 LAB — CMP (CANCER CENTER ONLY)
ALT: 18 U/L (ref 0–44)
AST: 21 U/L (ref 15–41)
Albumin: 2.8 g/dL — ABNORMAL LOW (ref 3.5–5.0)
Alkaline Phosphatase: 173 U/L — ABNORMAL HIGH (ref 38–126)
Anion gap: 10 (ref 5–15)
BUN: 13 mg/dL (ref 8–23)
CO2: 21 mmol/L — ABNORMAL LOW (ref 22–32)
Calcium: 7.8 mg/dL — ABNORMAL LOW (ref 8.9–10.3)
Chloride: 107 mmol/L (ref 98–111)
Creatinine: 1.08 mg/dL (ref 0.61–1.24)
GFR, Est AFR Am: >60 mL/min (ref >60)
GFR, Estimated: >60 mL/min (ref >60)
Glucose, Bld: 203 mg/dL — ABNORMAL HIGH (ref 70–99)
Potassium: 4.3 mmol/L (ref 3.5–5.1)
Sodium: 138 mmol/L (ref 135–145)
Total Bilirubin: 0.4 mg/dL (ref 0.3–1.2)
Total Protein: 6.2 g/dL — ABNORMAL LOW (ref 6.5–8.1)

## 2019-06-02 LAB — CBC WITH DIFFERENTIAL (CANCER CENTER ONLY)
Abs Immature Granulocytes: 0.73 10*3/uL — ABNORMAL HIGH (ref 0.00–0.07)
Basophils Absolute: 0.1 10*3/uL (ref 0.0–0.1)
Basophils Relative: 1 %
Eosinophils Absolute: 0.1 10*3/uL (ref 0.0–0.5)
Eosinophils Relative: 1 %
HCT: 32.1 % — ABNORMAL LOW (ref 39.0–52.0)
Hemoglobin: 10.6 g/dL — ABNORMAL LOW (ref 13.0–17.0)
Immature Granulocytes: 5 %
Lymphocytes Relative: 9 %
Lymphs Abs: 1.3 10*3/uL (ref 0.7–4.0)
MCH: 30.8 pg (ref 26.0–34.0)
MCHC: 33 g/dL (ref 30.0–36.0)
MCV: 93.3 fL (ref 80.0–100.0)
Monocytes Absolute: 1.1 10*3/uL — ABNORMAL HIGH (ref 0.1–1.0)
Monocytes Relative: 8 %
Neutro Abs: 10.1 10*3/uL — ABNORMAL HIGH (ref 1.7–7.7)
Neutrophils Relative %: 76 %
Platelet Count: 203 10*3/uL (ref 150–400)
RBC: 3.44 MIL/uL — ABNORMAL LOW (ref 4.22–5.81)
RDW: 15.5 % (ref 11.5–15.5)
WBC Count: 13.4 10*3/uL — ABNORMAL HIGH (ref 4.0–10.5)
nRBC: 0 % (ref 0.0–0.2)

## 2019-06-02 MED ORDER — HEPARIN SOD (PORK) LOCK FLUSH 100 UNIT/ML IV SOLN
500.0000 [IU] | Freq: Once | INTRAVENOUS | Status: AC
  Administered 2019-06-02: 500 [IU] via INTRAVENOUS
  Filled 2019-06-02: qty 5

## 2019-06-02 MED ORDER — SODIUM CHLORIDE 0.9% FLUSH
10.0000 mL | INTRAVENOUS | Status: DC | PRN
  Administered 2019-06-02: 10 mL via INTRAVENOUS
  Filled 2019-06-02: qty 10

## 2019-06-02 NOTE — Patient Instructions (Signed)

## 2019-06-03 ENCOUNTER — Ambulatory Visit (INDEPENDENT_AMBULATORY_CARE_PROVIDER_SITE_OTHER): Payer: No Typology Code available for payment source | Admitting: Pulmonary Disease

## 2019-06-03 ENCOUNTER — Encounter: Payer: Self-pay | Admitting: Pulmonary Disease

## 2019-06-03 DIAGNOSIS — C3491 Malignant neoplasm of unspecified part of right bronchus or lung: Secondary | ICD-10-CM

## 2019-06-03 DIAGNOSIS — C3492 Malignant neoplasm of unspecified part of left bronchus or lung: Secondary | ICD-10-CM

## 2019-06-03 DIAGNOSIS — Z7901 Long term (current) use of anticoagulants: Secondary | ICD-10-CM | POA: Diagnosis not present

## 2019-06-03 DIAGNOSIS — Z87891 Personal history of nicotine dependence: Secondary | ICD-10-CM | POA: Diagnosis not present

## 2019-06-03 DIAGNOSIS — Z86711 Personal history of pulmonary embolism: Secondary | ICD-10-CM

## 2019-06-03 DIAGNOSIS — Z9109 Other allergy status, other than to drugs and biological substances: Secondary | ICD-10-CM

## 2019-06-03 NOTE — Progress Notes (Signed)
Virtual Visit via Telephone Note  I connected with Dennis Stephens on 06/03/19 at 11:00 AM EDT by telephone and verified that I am speaking with the correct person using two identifiers.  Location: Patient: Dennis Stephens Provider: Garner Nash, DO    I discussed the limitations, risks, security and privacy concerns of performing an evaluation and management service by telephone and the availability of in person appointments. I also discussed with the patient that there may be a patient responsible charge related to this service. The patient expressed understanding and agreed to proceed.   History of Present Illness:  This is an 86 year old gentleman who was recently seen in the hospital in the middle of March with a newly found lung mass.  Prior tobacco abuse history and a newly recent diagnosed bilateral pulmonary emboli started on Xarelto.  Patient was taken for video bronchoscopy with endobronchial ultrasound and transbronchial needle aspirations as well as navigational bronchoscopy to biopsy lung mass.  I took the patient to endoscopy on 04/28/2019.  We took transbronchial needle brushings of the left upper lobe, Wang needle biopsies of the left upper lobe transbronchial forcep biopsies of the left upper lobe as well as BAL to the left upper lobe and mediastinal staging of the enlarged station 4R lymphadenopathy.  Patient was diagnosed with stage IV T1c, N3, M1C non-small cell lung cancer, squamous cell carcinoma.  Patient was seen by Dr. Earlie Server in the oncology office.  Decision was made to start paclitaxel plus carboplatinum plus Keytruda.  First cycle on April seventh 2021.  Patient last seen in the oncology office on 05/26/2019.  Office notes reviewed.   Observations/Objective: Overall doing well.  Has no respiratory complaints except for daily sputum production. Able to speak in complete sentences.  05/20/2019 nuclear medicine pet imaging PET avid bilateral multiple masses. Right  liver lesion PET avid. Concerning for metastatic cancer. The patient's images have been independently reviewed by me.    Assessment and Plan: Stage IV squamous cell carcinoma History of acute PE on anticoagulation Undergoing immunotherapy and chemotherapy. Allergies  Plan: Continue anticoagulation Follow-up with medical oncology for continued chemotherapy and immunotherapy. Please call our office with any concerns or changes in your shortness of breath or respiratory symptoms. Continues of bronchodilators as needed Use daily antihistamine, discussed Claritin and/or alternative such as leg graft  Follow Up Instructions:  As needed based on symptoms. Would like to follow-up and see how he is doing either with telephone visit or in person visit in approximately 4 months.  I discussed the assessment and treatment plan with the patient. The patient was provided an opportunity to ask questions and all were answered. The patient agreed with the plan and demonstrated an understanding of the instructions.   The patient was advised to call back or seek an in-person evaluation if the symptoms worsen or if the condition fails to improve as anticipated.  I provided 22 minutes of non-face-to-face time during this encounter.   Garner Nash, DO

## 2019-06-04 NOTE — Progress Notes (Signed)
Pharmacist Chemotherapy Monitoring - Follow Up Assessment    I verify that I have reviewed each item in the below checklist:  . Regimen for the patient is scheduled for the appropriate day and plan matches scheduled date. Marland Kitchen Appropriate non-routine labs are ordered dependent on drug ordered. . If applicable, additional medications reviewed and ordered per protocol based on lifetime cumulative doses and/or treatment regimen.   Plan for follow-up and/or issues identified: No . I-vent associated with next due treatment: No . MD and/or nursing notified: No  Christain Niznik D 06/04/2019 4:24 PM

## 2019-06-10 ENCOUNTER — Inpatient Hospital Stay: Payer: No Typology Code available for payment source

## 2019-06-10 ENCOUNTER — Encounter: Payer: Self-pay | Admitting: Internal Medicine

## 2019-06-10 ENCOUNTER — Inpatient Hospital Stay (HOSPITAL_BASED_OUTPATIENT_CLINIC_OR_DEPARTMENT_OTHER): Payer: No Typology Code available for payment source | Admitting: Internal Medicine

## 2019-06-10 ENCOUNTER — Other Ambulatory Visit: Payer: Self-pay

## 2019-06-10 VITALS — BP 144/62 | HR 86 | Temp 98.7°F | Resp 20 | Ht 68.0 in | Wt 206.7 lb

## 2019-06-10 DIAGNOSIS — Z5111 Encounter for antineoplastic chemotherapy: Secondary | ICD-10-CM

## 2019-06-10 DIAGNOSIS — C3491 Malignant neoplasm of unspecified part of right bronchus or lung: Secondary | ICD-10-CM

## 2019-06-10 DIAGNOSIS — I10 Essential (primary) hypertension: Secondary | ICD-10-CM | POA: Diagnosis not present

## 2019-06-10 DIAGNOSIS — Z5112 Encounter for antineoplastic immunotherapy: Secondary | ICD-10-CM

## 2019-06-10 DIAGNOSIS — Z95828 Presence of other vascular implants and grafts: Secondary | ICD-10-CM

## 2019-06-10 LAB — CBC WITH DIFFERENTIAL (CANCER CENTER ONLY)
Abs Immature Granulocytes: 0.07 10*3/uL (ref 0.00–0.07)
Basophils Absolute: 0.1 10*3/uL (ref 0.0–0.1)
Basophils Relative: 1 %
Eosinophils Absolute: 0 10*3/uL (ref 0.0–0.5)
Eosinophils Relative: 1 %
HCT: 32.6 % — ABNORMAL LOW (ref 39.0–52.0)
Hemoglobin: 10.8 g/dL — ABNORMAL LOW (ref 13.0–17.0)
Immature Granulocytes: 1 %
Lymphocytes Relative: 14 %
Lymphs Abs: 1 10*3/uL (ref 0.7–4.0)
MCH: 31.2 pg (ref 26.0–34.0)
MCHC: 33.1 g/dL (ref 30.0–36.0)
MCV: 94.2 fL (ref 80.0–100.0)
Monocytes Absolute: 0.9 10*3/uL (ref 0.1–1.0)
Monocytes Relative: 12 %
Neutro Abs: 5.3 10*3/uL (ref 1.7–7.7)
Neutrophils Relative %: 71 %
Platelet Count: 281 10*3/uL (ref 150–400)
RBC: 3.46 MIL/uL — ABNORMAL LOW (ref 4.22–5.81)
RDW: 15.3 % (ref 11.5–15.5)
WBC Count: 7.4 10*3/uL (ref 4.0–10.5)
nRBC: 0 % (ref 0.0–0.2)

## 2019-06-10 LAB — CMP (CANCER CENTER ONLY)
ALT: 19 U/L (ref 0–44)
AST: 18 U/L (ref 15–41)
Albumin: 2.9 g/dL — ABNORMAL LOW (ref 3.5–5.0)
Alkaline Phosphatase: 150 U/L — ABNORMAL HIGH (ref 38–126)
Anion gap: 8 (ref 5–15)
BUN: 13 mg/dL (ref 8–23)
CO2: 20 mmol/L — ABNORMAL LOW (ref 22–32)
Calcium: 8.6 mg/dL — ABNORMAL LOW (ref 8.9–10.3)
Chloride: 107 mmol/L (ref 98–111)
Creatinine: 1.18 mg/dL (ref 0.61–1.24)
GFR, Est AFR Am: >60 mL/min (ref >60)
GFR, Estimated: 57 mL/min — ABNORMAL LOW (ref >60)
Glucose, Bld: 288 mg/dL — ABNORMAL HIGH (ref 70–99)
Potassium: 4.5 mmol/L (ref 3.5–5.1)
Sodium: 135 mmol/L (ref 135–145)
Total Bilirubin: 0.4 mg/dL (ref 0.3–1.2)
Total Protein: 6.1 g/dL — ABNORMAL LOW (ref 6.5–8.1)

## 2019-06-10 MED ORDER — DIPHENHYDRAMINE HCL 50 MG/ML IJ SOLN
INTRAMUSCULAR | Status: AC
  Filled 2019-06-10: qty 1

## 2019-06-10 MED ORDER — DIPHENHYDRAMINE HCL 50 MG/ML IJ SOLN
50.0000 mg | Freq: Once | INTRAMUSCULAR | Status: AC
  Administered 2019-06-10: 50 mg via INTRAVENOUS

## 2019-06-10 MED ORDER — HEPARIN SOD (PORK) LOCK FLUSH 100 UNIT/ML IV SOLN
500.0000 [IU] | Freq: Once | INTRAVENOUS | Status: AC | PRN
  Administered 2019-06-10: 500 [IU]
  Filled 2019-06-10: qty 5

## 2019-06-10 MED ORDER — SODIUM CHLORIDE 0.9 % IV SOLN
200.0000 mg | Freq: Once | INTRAVENOUS | Status: AC
  Administered 2019-06-10: 200 mg via INTRAVENOUS
  Filled 2019-06-10: qty 8

## 2019-06-10 MED ORDER — PALONOSETRON HCL INJECTION 0.25 MG/5ML
INTRAVENOUS | Status: AC
  Filled 2019-06-10: qty 5

## 2019-06-10 MED ORDER — FAMOTIDINE IN NACL 20-0.9 MG/50ML-% IV SOLN
INTRAVENOUS | Status: AC
  Filled 2019-06-10: qty 50

## 2019-06-10 MED ORDER — SODIUM CHLORIDE 0.9 % IV SOLN
10.0000 mg | Freq: Once | INTRAVENOUS | Status: AC
  Administered 2019-06-10: 10 mg via INTRAVENOUS
  Filled 2019-06-10: qty 10

## 2019-06-10 MED ORDER — PALONOSETRON HCL INJECTION 0.25 MG/5ML
0.2500 mg | Freq: Once | INTRAVENOUS | Status: AC
  Administered 2019-06-10: 0.25 mg via INTRAVENOUS

## 2019-06-10 MED ORDER — SODIUM CHLORIDE 0.9 % IV SOLN
150.0000 mg | Freq: Once | INTRAVENOUS | Status: AC
  Administered 2019-06-10: 150 mg via INTRAVENOUS
  Filled 2019-06-10: qty 150

## 2019-06-10 MED ORDER — SODIUM CHLORIDE 0.9% FLUSH
10.0000 mL | INTRAVENOUS | Status: DC | PRN
  Administered 2019-06-10: 10 mL
  Filled 2019-06-10: qty 10

## 2019-06-10 MED ORDER — FAMOTIDINE IN NACL 20-0.9 MG/50ML-% IV SOLN
20.0000 mg | Freq: Once | INTRAVENOUS | Status: AC
  Administered 2019-06-10: 20 mg via INTRAVENOUS

## 2019-06-10 MED ORDER — SODIUM CHLORIDE 0.9% FLUSH
10.0000 mL | INTRAVENOUS | Status: DC | PRN
  Administered 2019-06-10: 10 mL via INTRAVENOUS
  Filled 2019-06-10: qty 10

## 2019-06-10 MED ORDER — SODIUM CHLORIDE 0.9 % IV SOLN
175.0000 mg/m2 | Freq: Once | INTRAVENOUS | Status: AC
  Administered 2019-06-10: 372 mg via INTRAVENOUS
  Filled 2019-06-10: qty 62

## 2019-06-10 MED ORDER — SODIUM CHLORIDE 0.9 % IV SOLN
Freq: Once | INTRAVENOUS | Status: AC
  Filled 2019-06-10: qty 250

## 2019-06-10 MED ORDER — SODIUM CHLORIDE 0.9 % IV SOLN
440.0000 mg | Freq: Once | INTRAVENOUS | Status: AC
  Administered 2019-06-10: 440 mg via INTRAVENOUS
  Filled 2019-06-10: qty 44

## 2019-06-10 NOTE — Patient Instructions (Signed)
[facility] Discharge Instructions for Patients Receiving Chemotherapy  Today you received the following chemotherapy agents Keytruda, Paclitaxel, Carboplatin  To help prevent nausea and vomiting after your treatment, we encourage you to take your nausea medication as prescribed.   If you develop nausea and vomiting that is not controlled by your nausea medication, call the clinic.   BELOW ARE SYMPTOMS THAT SHOULD BE REPORTED IMMEDIATELY:  *FEVER GREATER THAN 100.5 F  *CHILLS WITH OR WITHOUT FEVER  NAUSEA AND VOMITING THAT IS NOT CONTROLLED WITH YOUR NAUSEA MEDICATION  *UNUSUAL SHORTNESS OF BREATH  *UNUSUAL BRUISING OR BLEEDING  TENDERNESS IN MOUTH AND THROAT WITH OR WITHOUT PRESENCE OF ULCERS  *URINARY PROBLEMS  *BOWEL PROBLEMS  UNUSUAL RASH Items with * indicate a potential emergency and should be followed up as soon as possible.  Feel free to call the clinic should you have any questions or concerns. The clinic phone number is [phone].  Please show the [facility] at check-in to the Emergency Department and triage nurse.  Pembrolizumab injection What is this medicine? PEMBROLIZUMAB (pem broe liz ue mab) is a monoclonal antibody. It is used to treat certain types of cancer. This medicine may be used for other purposes; ask your health care provider or pharmacist if you have questions. COMMON BRAND NAME(S): Keytruda What should I tell my health care provider before I take this medicine? They need to know if you have any of these conditions:  diabetes  immune system problems  inflammatory bowel disease  liver disease  lung or breathing disease  lupus  received or scheduled to receive an organ transplant or a stem-cell transplant that uses donor stem cells  an unusual or allergic reaction to pembrolizumab, other medicines, foods, dyes, or preservatives  pregnant or trying to get pregnant  breast-feeding How should I use this  medicine? This medicine is for infusion into a vein. It is given by a health care professional in a hospital or clinic setting. A special MedGuide will be given to you before each treatment. Be sure to read this information carefully each time. Talk to your pediatrician regarding the use of this medicine in children. While this drug may be prescribed for children as young as 6 months for selected conditions, precautions do apply. Overdosage: If you think you have taken too much of this medicine contact a poison control center or emergency room at once. NOTE: This medicine is only for you. Do not share this medicine with others. What if I miss a dose? It is important not to miss your dose. Call your doctor or health care professional if you are unable to keep an appointment. What may interact with this medicine? Interactions have not been studied. Give your health care provider a list of all the medicines, herbs, non-prescription drugs, or dietary supplements you use. Also tell them if you smoke, drink alcohol, or use illegal drugs. Some items may interact with your medicine. This list may not describe all possible interactions. Give your health care provider a list of all the medicines, herbs, non-prescription drugs, or dietary supplements you use. Also tell them if you smoke, drink alcohol, or use illegal drugs. Some items may interact with your medicine. What should I watch for while using this medicine? Your condition will be monitored carefully while you are receiving this medicine. You may need blood work done while you are taking this medicine. Do not become pregnant while taking this medicine or for 4 months after stopping it.  Women should inform their doctor if they wish to become pregnant or think they might be pregnant. There is a potential for serious side effects to an unborn child. Talk to your health care professional or pharmacist for more information. Do not breast-feed an infant while  taking this medicine or for 4 months after the last dose. What side effects may I notice from receiving this medicine? Side effects that you should report to your doctor or health care professional as soon as possible:  allergic reactions like skin rash, itching or hives, swelling of the face, lips, or tongue  bloody or black, tarry  breathing problems  changes in vision  chest pain  chills  confusion  constipation  cough  diarrhea  dizziness or feeling faint or lightheaded  fast or irregular heartbeat  fever  flushing  joint pain  low blood counts - this medicine may decrease the number of white blood cells, red blood cells and platelets. You may be at increased risk for infections and bleeding.  muscle pain  muscle weakness  pain, tingling, numbness in the hands or feet  persistent headache  redness, blistering, peeling or loosening of the skin, including inside the mouth  signs and symptoms of high blood sugar such as dizziness; dry mouth; dry skin; fruity breath; nausea; stomach pain; increased hunger or thirst; increased urination  signs and symptoms of kidney injury like trouble passing urine or change in the amount of urine  signs and symptoms of liver injury like dark urine, light-colored stools, loss of appetite, nausea, right upper belly pain, yellowing of the eyes or skin  sweating  swollen lymph nodes  weight loss Side effects that usually do not require medical attention (report to your doctor or health care professional if they continue or are bothersome):  decreased appetite  hair loss  muscle pain  tiredness This list may not describe all possible side effects. Call your doctor for medical advice about side effects. You may report side effects to FDA at 1-800-FDA-1088. Where should I keep my medicine? This drug is given in a hospital or clinic and will not be stored at home. NOTE: This sheet is a summary. It may not cover all  possible information. If you have questions about this medicine, talk to your doctor, pharmacist, or health care provider.  2020 Elsevier/Gold Standard (2018-12-04 18:07:58)  Paclitaxel injection What is this medicine? PACLITAXEL (PAK li TAX el) is a chemotherapy drug. It targets fast dividing cells, like cancer cells, and causes these cells to die. This medicine is used to treat ovarian cancer, breast cancer, lung cancer, Kaposi's sarcoma, and other cancers. This medicine may be used for other purposes; ask your health care provider or pharmacist if you have questions. COMMON BRAND NAME(S): Onxol, Taxol What should I tell my health care provider before I take this medicine? They need to know if you have any of these conditions:  history of irregular heartbeat  liver disease  low blood counts, like low white cell, platelet, or red cell counts  lung or breathing disease, like asthma  tingling of the fingers or toes, or other nerve disorder  an unusual or allergic reaction to paclitaxel, alcohol, polyoxyethylated castor oil, other chemotherapy, other medicines, foods, dyes, or preservatives  pregnant or trying to get pregnant  breast-feeding How should I use this medicine? This drug is given as an infusion into a vein. It is administered in a hospital or clinic by a specially trained health care professional. Talk to  your pediatrician regarding the use of this medicine in children. Special care may be needed. Overdosage: If you think you have taken too much of this medicine contact a poison control center or emergency room at once. NOTE: This medicine is only for you. Do not share this medicine with others. What if I miss a dose? It is important not to miss your dose. Call your doctor or health care professional if you are unable to keep an appointment. What may interact with this medicine? Do not take this medicine with any of the following  medications:  disulfiram  metronidazole This medicine may also interact with the following medications:  antiviral medicines for hepatitis, HIV or AIDS  certain antibiotics like erythromycin and clarithromycin  certain medicines for fungal infections like ketoconazole and itraconazole  certain medicines for seizures like carbamazepine, phenobarbital, phenytoin  gemfibrozil  nefazodone  rifampin  St. John's wort This list may not describe all possible interactions. Give your health care provider a list of all the medicines, herbs, non-prescription drugs, or dietary supplements you use. Also tell them if you smoke, drink alcohol, or use illegal drugs. Some items may interact with your medicine. What should I watch for while using this medicine? Your condition will be monitored carefully while you are receiving this medicine. You will need important blood work done while you are taking this medicine. This medicine can cause serious allergic reactions. To reduce your risk you will need to take other medicine(s) before treatment with this medicine. If you experience allergic reactions like skin rash, itching or hives, swelling of the face, lips, or tongue, tell your doctor or health care professional right away. In some cases, you may be given additional medicines to help with side effects. Follow all directions for their use. This drug may make you feel generally unwell. This is not uncommon, as chemotherapy can affect healthy cells as well as cancer cells. Report any side effects. Continue your course of treatment even though you feel ill unless your doctor tells you to stop. Call your doctor or health care professional for advice if you get a fever, chills or sore throat, or other symptoms of a cold or flu. Do not treat yourself. This drug decreases your body's ability to fight infections. Try to avoid being around people who are sick. This medicine may increase your risk to bruise or  bleed. Call your doctor or health care professional if you notice any unusual bleeding. Be careful brushing and flossing your teeth or using a toothpick because you may get an infection or bleed more easily. If you have any dental work done, tell your dentist you are receiving this medicine. Avoid taking products that contain aspirin, acetaminophen, ibuprofen, naproxen, or ketoprofen unless instructed by your doctor. These medicines may hide a fever. Do not become pregnant while taking this medicine. Women should inform their doctor if they wish to become pregnant or think they might be pregnant. There is a potential for serious side effects to an unborn child. Talk to your health care professional or pharmacist for more information. Do not breast-feed an infant while taking this medicine. Men are advised not to father a child while receiving this medicine. This product may contain alcohol. Ask your pharmacist or healthcare provider if this medicine contains alcohol. Be sure to tell all healthcare providers you are taking this medicine. Certain medicines, like metronidazole and disulfiram, can cause an unpleasant reaction when taken with alcohol. The reaction includes flushing, headache, nausea, vomiting, sweating, and  increased thirst. The reaction can last from 30 minutes to several hours. What side effects may I notice from receiving this medicine? Side effects that you should report to your doctor or health care professional as soon as possible:  allergic reactions like skin rash, itching or hives, swelling of the face, lips, or tongue  breathing problems  changes in vision  fast, irregular heartbeat  high or low blood pressure  mouth sores  pain, tingling, numbness in the hands or feet  signs of decreased platelets or bleeding - bruising, pinpoint red spots on the skin, black, tarry stools, blood in the urine  signs of decreased red blood cells - unusually weak or tired, feeling faint  or lightheaded, falls  signs of infection - fever or chills, cough, sore throat, pain or difficulty passing urine  signs and symptoms of liver injury like dark yellow or brown urine; general ill feeling or flu-like symptoms; light-colored stools; loss of appetite; nausea; right upper belly pain; unusually weak or tired; yellowing of the eyes or skin  swelling of the ankles, feet, hands  unusually slow heartbeat Side effects that usually do not require medical attention (report to your doctor or health care professional if they continue or are bothersome):  diarrhea  hair loss  loss of appetite  muscle or joint pain  nausea, vomiting  pain, redness, or irritation at site where injected  tiredness This list may not describe all possible side effects. Call your doctor for medical advice about side effects. You may report side effects to FDA at 1-800-FDA-1088. Where should I keep my medicine? This drug is given in a hospital or clinic and will not be stored at home. NOTE: This sheet is a summary. It may not cover all possible information. If you have questions about this medicine, talk to your doctor, pharmacist, or health care provider.  2020 Elsevier/Gold Standard (2016-10-01 13:14:55)   Carboplatin injection What is this medicine? CARBOPLATIN (KAR boe pla tin) is a chemotherapy drug. It targets fast dividing cells, like cancer cells, and causes these cells to die. This medicine is used to treat ovarian cancer and many other cancers. This medicine may be used for other purposes; ask your health care provider or pharmacist if you have questions. COMMON BRAND NAME(S): Paraplatin What should I tell my health care provider before I take this medicine? They need to know if you have any of these conditions:  blood disorders  hearing problems  kidney disease  recent or ongoing radiation therapy  an unusual or allergic reaction to carboplatin, cisplatin, other chemotherapy,  other medicines, foods, dyes, or preservatives  pregnant or trying to get pregnant  breast-feeding How should I use this medicine? This drug is usually given as an infusion into a vein. It is administered in a hospital or clinic by a specially trained health care professional. Talk to your pediatrician regarding the use of this medicine in children. Special care may be needed. Overdosage: If you think you have taken too much of this medicine contact a poison control center or emergency room at once. NOTE: This medicine is only for you. Do not share this medicine with others. What if I miss a dose? It is important not to miss a dose. Call your doctor or health care professional if you are unable to keep an appointment. What may interact with this medicine?  medicines for seizures  medicines to increase blood counts like filgrastim, pegfilgrastim, sargramostim  some antibiotics like amikacin, gentamicin, neomycin, streptomycin, tobramycin  vaccines Talk to your doctor or health care professional before taking any of these medicines:  acetaminophen  aspirin  ibuprofen  ketoprofen  naproxen This list may not describe all possible interactions. Give your health care provider a list of all the medicines, herbs, non-prescription drugs, or dietary supplements you use. Also tell them if you smoke, drink alcohol, or use illegal drugs. Some items may interact with your medicine. What should I watch for while using this medicine? Your condition will be monitored carefully while you are receiving this medicine. You will need important blood work done while you are taking this medicine. This drug may make you feel generally unwell. This is not uncommon, as chemotherapy can affect healthy cells as well as cancer cells. Report any side effects. Continue your course of treatment even though you feel ill unless your doctor tells you to stop. In some cases, you may be given additional medicines to  help with side effects. Follow all directions for their use. Call your doctor or health care professional for advice if you get a fever, chills or sore throat, or other symptoms of a cold or flu. Do not treat yourself. This drug decreases your body's ability to fight infections. Try to avoid being around people who are sick. This medicine may increase your risk to bruise or bleed. Call your doctor or health care professional if you notice any unusual bleeding. Be careful brushing and flossing your teeth or using a toothpick because you may get an infection or bleed more easily. If you have any dental work done, tell your dentist you are receiving this medicine. Avoid taking products that contain aspirin, acetaminophen, ibuprofen, naproxen, or ketoprofen unless instructed by your doctor. These medicines may hide a fever. Do not become pregnant while taking this medicine. Women should inform their doctor if they wish to become pregnant or think they might be pregnant. There is a potential for serious side effects to an unborn child. Talk to your health care professional or pharmacist for more information. Do not breast-feed an infant while taking this medicine. What side effects may I notice from receiving this medicine? Side effects that you should report to your doctor or health care professional as soon as possible:  allergic reactions like skin rash, itching or hives, swelling of the face, lips, or tongue  signs of infection - fever or chills, cough, sore throat, pain or difficulty passing urine  signs of decreased platelets or bleeding - bruising, pinpoint red spots on the skin, black, tarry stools, nosebleeds  signs of decreased red blood cells - unusually weak or tired, fainting spells, lightheadedness  breathing problems  changes in hearing  changes in vision  chest pain  high blood pressure  low blood counts - This drug may decrease the number of white blood cells, red blood cells  and platelets. You may be at increased risk for infections and bleeding.  nausea and vomiting  pain, swelling, redness or irritation at the injection site  pain, tingling, numbness in the hands or feet  problems with balance, talking, walking  trouble passing urine or change in the amount of urine Side effects that usually do not require medical attention (report to your doctor or health care professional if they continue or are bothersome):  hair loss  loss of appetite  metallic taste in the mouth or changes in taste This list may not describe all possible side effects. Call your doctor for medical advice about side effects. You may report side  effects to FDA at 1-800-FDA-1088. Where should I keep my medicine? This drug is given in a hospital or clinic and will not be stored at home. NOTE: This sheet is a summary. It may not cover all possible information. If you have questions about this medicine, talk to your doctor, pharmacist, or health care provider.  2020 Elsevier/Gold Standard (2007-05-05 14:38:05)

## 2019-06-10 NOTE — Progress Notes (Signed)
[place] [phone]   Fax:(336) [place] [address] [place] [address]  DIAGNOSIS: Stage IV (T1c, N3, M1 C) non-small cell lung cancer, squamous cell carcinoma presented with bilateral pulmonary nodules in addition to mediastinal lymphadenopathy and liver metastasis diagnosed in March 2021.  PRIOR THERAPY: None  CURRENT THERAPY: Systemic chemotherapy with carboplatin for AUC of 5, paclitaxel 175 mg/M2 and Keytruda 200 mg IV every 3 weeks.  First dose May 19, 2019.  Status post 1 cycle.  INTERVAL HISTORY: Dennis Stephens 86 y.o. male returns to the clinic today for follow-up visit.  The patient is feeling fine today with no concerning complaints except for some numbness in the skin of the thigh anteriorly after the Port-A-Cath placement.  He denied having any current chest pain, shortness of breath except with exertion with no cough or hemoptysis.  He denied having any fever or chills.  He has no nausea, vomiting, diarrhea or constipation.  He denied having any headache or visual changes.  The patient tolerated the first cycle of his treatment well. He is here today for evaluation before starting cycle #2.   MEDICAL HISTORY: Past Medical History:  Diagnosis Date  . A-fib ([place])   . DM2 (diabetes mellitus, type 2) ([place])   . Hypertension     ALLERGIES:  has No Known Allergies.  MEDICATIONS:  Current Outpatient Medications  Medication Sig Dispense Refill  . amLODipine (NORVASC) 2.5 MG tablet Take 2.5 mg by mouth daily.    Marland Kitchen apixaban (ELIQUIS) 5 MG TABS tablet Take 1 tablet (5 mg total) by mouth 2 (two) times daily. 60 tablet 2  . atorvastatin (LIPITOR) 40 MG tablet Take 40 mg by mouth daily.    . carvedilol (COREG) 12.5 MG tablet Take 12.5 mg by mouth 2 (two) times daily with a meal.    . doxazosin (CARDURA) 8 MG tablet Take 8 mg by mouth daily.    Marland Kitchen glimepiride (AMARYL) 2 MG tablet Take 1  tablet (2 mg total) by mouth daily with breakfast. 30 tablet 11  . lidocaine-prilocaine (EMLA) cream Apply to the Port-A-Cath site 30-60-minute before treatment. 30 g 1  . loratadine (CLARITIN) 10 MG tablet Take 1 tablet (10 mg total) by mouth daily as needed for allergies or rhinitis. 30 tablet 4  . losartan (COZAAR) 100 MG tablet Take 100 mg by mouth daily.    Marland Kitchen omeprazole (PRILOSEC) 20 MG capsule Take 20 mg by mouth daily.    . prochlorperazine (COMPAZINE) 10 MG tablet Take 1 tablet (10 mg total) by mouth every 6 (six) hours as needed for nausea or vomiting. 30 tablet 0  . sertraline (ZOLOFT) 100 MG tablet Take 100 mg by mouth daily.    . temazepam (RESTORIL) 30 MG capsule Take 30 mg by mouth at bedtime as needed for sleep.     No current facility-administered medications for this visit.    SURGICAL HISTORY:  Past Surgical History:  Procedure Laterality Date  . BRONCHIAL BIOPSY  04/28/2019   Procedure: BRONCHIAL BIOPSIES;  Surgeon: Garner Nash, DO;  Location: [facility];  Service: Pulmonary;;  . BRONCHIAL BRUSHINGS  04/28/2019   Procedure: BRONCHIAL BRUSHINGS;  Surgeon: Garner Nash, DO;  Location: [facility];  Service: Pulmonary;;  . BRONCHIAL NEEDLE ASPIRATION BIOPSY  04/28/2019   Procedure: BRONCHIAL NEEDLE ASPIRATION BIOPSIES;  Surgeon: Garner Nash, DO;  Location: [facility];  Service: Pulmonary;;  . BRONCHIAL WASHINGS  04/28/2019   Procedure: BRONCHIAL WASHINGS;  Surgeon: Garner Nash, DO;  Location: [facility];  Service: Pulmonary;;  . ENDOBRONCHIAL ULTRASOUND N/A 04/28/2019   Procedure: ENDOBRONCHIAL ULTRASOUND;  Surgeon: Garner Nash, DO;  Location: [facility];  Service: Pulmonary;  Laterality: N/A;  . ERCP N/A 04/27/2019   Procedure: ENDOSCOPIC RETROGRADE CHOLANGIOPANCREATOGRAPHY (ERCP);  Surgeon: Irene Shipper, MD;  Location: [facility];  Service: Endoscopy;  Laterality: N/A;  . ESOPHAGOGASTRODUODENOSCOPY (EGD) WITH PROPOFOL N/A 05/04/2019    Procedure: ESOPHAGOGASTRODUODENOSCOPY (EGD) WITH PROPOFOL;  Surgeon: Irene Shipper, MD;  Location: [facility];  Service: Endoscopy;  Laterality: N/A;  . IR IMAGING GUIDED PORT INSERTION  05/21/2019  . LIPOMA EXCISION    . REMOVAL OF STONES  04/27/2019   Procedure: REMOVAL OF STONES;  Surgeon: Irene Shipper, MD;  Location: [facility];  Service: Endoscopy;;  . Joan Mayans  04/27/2019   Procedure: Joan Mayans;  Surgeon: Irene Shipper, MD;  Location: [facility];  Service: Endoscopy;;  . VIDEO BRONCHOSCOPY WITH ENDOBRONCHIAL NAVIGATION N/A 04/28/2019   Procedure: VIDEO BRONCHOSCOPY WITH ENDOBRONCHIAL NAVIGATION;  Surgeon: Garner Nash, DO;  Location: [facility];  Service: Pulmonary;  Laterality: N/A;    REVIEW OF SYSTEMS:  A comprehensive review of systems was negative except for: Constitutional: positive for fatigue Neurological: positive for paresthesia   PHYSICAL EXAMINATION: General appearance: alert, cooperative, fatigued and no distress Head: Normocephalic, without obvious abnormality, atraumatic Neck: no adenopathy, no JVD, supple, symmetrical, trachea midline and thyroid not enlarged, symmetric, no tenderness/mass/nodules Lymph nodes: Cervical, supraclavicular, and axillary nodes normal. Resp: clear to auscultation bilaterally and normal percussion bilaterally Back: symmetric, no curvature. ROM normal. No CVA tenderness. Cardio: regular rate and rhythm, S1, S2 normal, no murmur, click, rub or gallop GI: soft, non-tender; bowel sounds normal; no masses,  no organomegaly Extremities: extremities normal, atraumatic, no cyanosis or edema  ECOG PERFORMANCE STATUS: 1 - Symptomatic but completely ambulatory  Blood pressure (!) 144/62, pulse 86, temperature 98.7 F (37.1 C), temperature source Temporal, resp. rate 20, height 5\' 8"  (1.727 m), weight 206 lb 11.2 oz (93.8 kg), SpO2 98 %.  LABORATORY DATA: Lab Results  Component Value Date   WBC 7.4 06/10/2019   HGB 10.8 (L)  06/10/2019   HCT 32.6 (L) 06/10/2019   MCV 94.2 06/10/2019   PLT 281 06/10/2019      Chemistry      Component Value Date/Time   NA 138 06/02/2019 1030   K 4.3 06/02/2019 1030   CL 107 06/02/2019 1030   CO2 21 (L) 06/02/2019 1030   BUN 13 06/02/2019 1030   CREATININE 1.08 06/02/2019 1030      Component Value Date/Time   CALCIUM 7.8 (L) 06/02/2019 1030   ALKPHOS 173 (H) 06/02/2019 1030   AST 21 06/02/2019 1030   ALT 18 06/02/2019 1030   BILITOT 0.4 06/02/2019 1030       RADIOGRAPHIC STUDIES: NM PET Image Initial (PI) Skull Base To Thigh  Result Date: 05/20/2019 CLINICAL DATA:  Initial treatment strategy for non-small cell lung cancer. EXAM: NUCLEAR MEDICINE PET SKULL BASE TO THIGH TECHNIQUE: 9.94 mCi F-18 FDG was injected intravenously. Full-ring PET imaging was performed from the skull base to thigh after the radiotracer. CT data was obtained and used for attenuation correction and anatomic localization. Fasting blood glucose: 138 mg/dl COMPARISON:  CT abdomen and pelvis 05/02/2019, MR abdomen of 04/26/2019, CT chest of 04/20/2019. FINDINGS: Mediastinal blood pool activity: SUV max 1.96 Liver activity: SUV max NA NECK: No hypermetabolic  lymph nodes in the neck. Incidental CT findings: None CHEST: Nodal disease and parenchymal abnormalities in the chest show hypermetabolic activity. (Image 68, series 4): Spiculated left upper lobe mass measuring 3 x 2.5 cm (SUVmax = 14.7) Anterior mediastinal adenopathy (image 60, series 4): 1.6 cm short axis dimension (SUVmax = 8.5) (Image 61, series 4): 16 mm right paratracheal lymph node (SUVmax = 8.6) Similar size and similar metabolic activity shown in a precarinal lymph node as well. Right upper lobe mass with irregular margins (image 19, series 8): 3.0 x 2.3 cm (SUVmax = 8.3) Mildly hypermetabolic right hilar lymph node less than a cm in size (SUVmax = 5.1) Incidental CT findings: Atherosclerotic changes and coronary artery calcification. Moderate  cardiac enlargement. Pulmonary emphysema. ABDOMEN/PELVIS: Focus of hypermetabolic change in the right hepatic lobe (image 104, series 4) 15 mm (SUVmax = 5.5) Tiny focus of increased metabolic activity along the capsule of the right hemi liver (image 115, series 3, no corresponding CT abnormality. Only slightly above background hepatic activity. Adrenal glands are normal. Incidental CT findings: Pneumobilia. Likely related to recent ERCP. Spleen, pancreas, adrenal glands and kidneys without signs of acute abnormality. Cyst in the upper pole of the right kidney. Colonic diverticulosis without acute bowel process. Normal appendix. Calcified atherosclerotic changes throughout the abdominal aorta. Prostate is unremarkable by CT. SKELETON: No focal hypermetabolic activity to suggest skeletal metastasis. Extensive muscular uptake. Incidental CT findings: Spinal degenerative changes. No acute or destructive bone finding. IMPRESSION: Bilateral pulmonary masses compatible with known pulmonary neoplasm and associated with metastatic nodes in the chest and a focus of metastatic disease to the liver Second small focus along the right liver margin raising the question of additional site of metastasis to the liver, attention on follow-up. Aortic Atherosclerosis (ICD10-I70.0) and Emphysema (ICD10-J43.9). Electronically Signed   By: Zetta Bills M.D.   On: 05/20/2019 17:37   IR IMAGING GUIDED PORT INSERTION  Result Date: 05/21/2019 INDICATION: 86 year old male with a history of lung cancer in need of durable venous access for chemotherapy. EXAM: IMPLANTED PORT A CATH PLACEMENT WITH ULTRASOUND AND FLUOROSCOPIC GUIDANCE MEDICATIONS: 1 g Ancef; The antibiotic was administered within an appropriate time interval prior to skin puncture. ANESTHESIA/SEDATION: Versed 2 mg IV; Fentanyl 100 mcg IV; Moderate Sedation Time:  18 minutes The patient was continuously monitored during the procedure by the interventional radiology nurse under  my direct supervision. FLUOROSCOPY TIME:  0 minutes, 24 seconds (7 mGy) COMPLICATIONS: None immediate. PROCEDURE: The right neck and chest was prepped with chlorhexidine, and draped in the usual sterile fashion using maximum barrier technique (cap and mask, sterile gown, sterile gloves, large sterile sheet, hand hygiene and cutaneous antiseptic). Local anesthesia was attained by infiltration with 1% lidocaine with epinephrine. Ultrasound demonstrated patency of the right internal jugular vein, and this was documented with an image. Under real-time ultrasound guidance, this vein was accessed with a 21 gauge micropuncture needle and image documentation was performed. A small dermatotomy was made at the access site with an 11 scalpel. A 0.018" wire was advanced into the SVC and the access needle exchanged for a 64F micropuncture vascular sheath. The 0.018" wire was then removed and a 0.035" wire advanced into the IVC. An appropriate location for the subcutaneous reservoir was selected below the clavicle and an incision was made through the skin and underlying soft tissues. The subcutaneous tissues were then dissected using a combination of blunt and sharp surgical technique and a pocket was formed. A single lumen power injectable portacatheter was  then tunneled through the subcutaneous tissues from the pocket to the dermatotomy and the port reservoir placed within the subcutaneous pocket. The venous access site was then serially dilated and a peel away vascular sheath placed over the wire. The wire was removed and the port catheter advanced into position under fluoroscopic guidance. The catheter tip is positioned in the superior cavoatrial junction. This was documented with a spot image. The portacatheter was then tested and found to flush and aspirate well. The port was flushed with saline followed by 100 units/mL heparinized saline. The pocket was then closed in two layers using first subdermal inverted interrupted  absorbable sutures followed by a running subcuticular suture. The epidermis was then sealed with Dermabond. The dermatotomy at the venous access site was also closed with Dermabond. IMPRESSION: Successful placement of a right IJ approach Power Port with ultrasound and fluoroscopic guidance. The catheter is ready for use. Electronically Signed   By: Jacqulynn Cadet M.D.   On: 05/21/2019 15:53    ASSESSMENT AND PLAN: This is a very pleasant 86 years old white male recently diagnosed with a stage IV (T1c, N3, M1 C) non-small cell lung cancer, squamous cell carcinoma presented with bilateral pulmonary nodules in addition to bilateral mediastinal lymphadenopathy and liver metastasis in March 2021. The patient is currently undergoing systemic chemotherapy with carboplatin for AUC of 5, paclitaxel 175 mg/M2 and Keytruda 200 mg IV every 3 weeks status post 1 cycle.  He tolerated the first cycle of his treatment well with no concerning adverse effects. I recommended for the patient to proceed with cycle #2 today as planned. The patient mentioned that he may move to [facility] in June 2021 and he already established care with a primary care physician with the [facility] facility there.  He will also find a medical oncologist in the area to take care of his cancer needs after moving.  I gave him a list of the medical oncologist in the area and will be happy to transfer his records when he is ready to move. He will come back for follow-up visit in 3 weeks for evaluation before starting cycle #3. The patient was advised to call immediately if he has any concerning symptoms in the interval. The patient voices understanding of current disease status and treatment options and is in agreement with the current care plan.  All questions were answered. The patient knows to call the clinic with any problems, questions or concerns. We can certainly see the patient much sooner if necessary.   Disclaimer: This note was dictated  with voice recognition software. Similar sounding words can inadvertently be transcribed and may not be corrected upon review.

## 2019-06-11 ENCOUNTER — Telehealth: Payer: Self-pay | Admitting: Internal Medicine

## 2019-06-11 NOTE — Telephone Encounter (Signed)
Scheduled per los. Called and left msg. Mailed prntout

## 2019-06-12 ENCOUNTER — Other Ambulatory Visit: Payer: Self-pay

## 2019-06-12 ENCOUNTER — Inpatient Hospital Stay: Payer: No Typology Code available for payment source | Attending: Internal Medicine

## 2019-06-12 VITALS — BP 126/60 | HR 83 | Temp 98.5°F | Resp 20

## 2019-06-12 DIAGNOSIS — Z5111 Encounter for antineoplastic chemotherapy: Secondary | ICD-10-CM | POA: Diagnosis not present

## 2019-06-12 DIAGNOSIS — I10 Essential (primary) hypertension: Secondary | ICD-10-CM | POA: Diagnosis not present

## 2019-06-12 DIAGNOSIS — C787 Secondary malignant neoplasm of liver and intrahepatic bile duct: Secondary | ICD-10-CM | POA: Insufficient documentation

## 2019-06-12 DIAGNOSIS — Z7901 Long term (current) use of anticoagulants: Secondary | ICD-10-CM | POA: Diagnosis not present

## 2019-06-12 DIAGNOSIS — E119 Type 2 diabetes mellitus without complications: Secondary | ICD-10-CM | POA: Diagnosis not present

## 2019-06-12 DIAGNOSIS — Z7689 Persons encountering health services in other specified circumstances: Secondary | ICD-10-CM | POA: Diagnosis not present

## 2019-06-12 DIAGNOSIS — R5383 Other fatigue: Secondary | ICD-10-CM | POA: Diagnosis not present

## 2019-06-12 DIAGNOSIS — I4891 Unspecified atrial fibrillation: Secondary | ICD-10-CM | POA: Diagnosis not present

## 2019-06-12 DIAGNOSIS — C3491 Malignant neoplasm of unspecified part of right bronchus or lung: Secondary | ICD-10-CM | POA: Insufficient documentation

## 2019-06-12 DIAGNOSIS — Z79899 Other long term (current) drug therapy: Secondary | ICD-10-CM | POA: Diagnosis not present

## 2019-06-12 DIAGNOSIS — R1011 Right upper quadrant pain: Secondary | ICD-10-CM | POA: Diagnosis not present

## 2019-06-12 MED ORDER — PEGFILGRASTIM-JMDB 6 MG/0.6ML ~~LOC~~ SOSY
6.0000 mg | PREFILLED_SYRINGE | Freq: Once | SUBCUTANEOUS | Status: AC
  Administered 2019-06-12: 11:00:00 6 mg via SUBCUTANEOUS

## 2019-06-12 NOTE — Patient Instructions (Signed)

## 2019-06-14 ENCOUNTER — Other Ambulatory Visit: Payer: Self-pay | Admitting: Internal Medicine

## 2019-06-15 ENCOUNTER — Telehealth: Payer: Self-pay | Admitting: *Deleted

## 2019-06-15 NOTE — Telephone Encounter (Signed)
If he still has pain in the abdomen, he may need to go to the ED and get it done sooner or reach out to his surgeon. We can order scan next week but not sure if I can fix his gall bladder stones.

## 2019-06-15 NOTE — Telephone Encounter (Signed)
Patient called to question when and if he was going to have follow up via CT scan of his abdomen.  He reports he mentioned to Dr. Julien Nordmann that he is worried he might have a stone still in his bile duct because he still has pain the abdomen all the time, worse with twisting and moving, and mainly on the right side.  No notes to support, need clarification.  Patient is sl[place] out [place] 20th and would like this done prior to the move.    Routed to MD and PA for clarification.

## 2019-06-15 NOTE — Telephone Encounter (Signed)
Per Patient telephone call regarding abdominal pain, patient was referred to reach out to surgeon for follow up.  Patient is aware.

## 2019-06-16 ENCOUNTER — Other Ambulatory Visit: Payer: Self-pay

## 2019-06-16 ENCOUNTER — Inpatient Hospital Stay: Payer: No Typology Code available for payment source

## 2019-06-16 ENCOUNTER — Telehealth: Payer: Self-pay | Admitting: Internal Medicine

## 2019-06-16 DIAGNOSIS — R1011 Right upper quadrant pain: Secondary | ICD-10-CM

## 2019-06-16 DIAGNOSIS — Z5111 Encounter for antineoplastic chemotherapy: Secondary | ICD-10-CM | POA: Diagnosis not present

## 2019-06-16 DIAGNOSIS — Z95828 Presence of other vascular implants and grafts: Secondary | ICD-10-CM

## 2019-06-16 DIAGNOSIS — C3491 Malignant neoplasm of unspecified part of right bronchus or lung: Secondary | ICD-10-CM

## 2019-06-16 LAB — CBC WITH DIFFERENTIAL (CANCER CENTER ONLY)
Abs Immature Granulocytes: 0.13 10*3/uL — ABNORMAL HIGH (ref 0.00–0.07)
Basophils Absolute: 0.1 10*3/uL (ref 0.0–0.1)
Basophils Relative: 1 %
Eosinophils Absolute: 0.1 10*3/uL (ref 0.0–0.5)
Eosinophils Relative: 1 %
HCT: 32.9 % — ABNORMAL LOW (ref 39.0–52.0)
Hemoglobin: 10.9 g/dL — ABNORMAL LOW (ref 13.0–17.0)
Immature Granulocytes: 1 %
Lymphocytes Relative: 9 %
Lymphs Abs: 1.2 10*3/uL (ref 0.7–4.0)
MCH: 31.3 pg (ref 26.0–34.0)
MCHC: 33.1 g/dL (ref 30.0–36.0)
MCV: 94.5 fL (ref 80.0–100.0)
Monocytes Absolute: 0.4 10*3/uL (ref 0.1–1.0)
Monocytes Relative: 3 %
Neutro Abs: 11.2 10*3/uL — ABNORMAL HIGH (ref 1.7–7.7)
Neutrophils Relative %: 85 %
Platelet Count: 224 10*3/uL (ref 150–400)
RBC: 3.48 MIL/uL — ABNORMAL LOW (ref 4.22–5.81)
RDW: 15 % (ref 11.5–15.5)
WBC Count: 13 10*3/uL — ABNORMAL HIGH (ref 4.0–10.5)
nRBC: 0 % (ref 0.0–0.2)

## 2019-06-16 LAB — CMP (CANCER CENTER ONLY)
ALT: 17 U/L (ref 0–44)
AST: 16 U/L (ref 15–41)
Albumin: 3.1 g/dL — ABNORMAL LOW (ref 3.5–5.0)
Alkaline Phosphatase: 184 U/L — ABNORMAL HIGH (ref 38–126)
Anion gap: 8 (ref 5–15)
BUN: 17 mg/dL (ref 8–23)
CO2: 22 mmol/L (ref 22–32)
Calcium: 8.7 mg/dL — ABNORMAL LOW (ref 8.9–10.3)
Chloride: 105 mmol/L (ref 98–111)
Creatinine: 1.01 mg/dL (ref 0.61–1.24)
GFR, Est AFR Am: >60 mL/min (ref >60)
GFR, Estimated: >60 mL/min (ref >60)
Glucose, Bld: 206 mg/dL — ABNORMAL HIGH (ref 70–99)
Potassium: 4.7 mmol/L (ref 3.5–5.1)
Sodium: 135 mmol/L (ref 135–145)
Total Bilirubin: 0.3 mg/dL (ref 0.3–1.2)
Total Protein: 6.3 g/dL — ABNORMAL LOW (ref 6.5–8.1)

## 2019-06-16 LAB — TSH: TSH: 0.814 u[IU]/mL (ref 0.320–4.118)

## 2019-06-16 MED ORDER — SODIUM CHLORIDE 0.9% FLUSH
10.0000 mL | INTRAVENOUS | Status: DC | PRN
  Administered 2019-06-16: 10 mL via INTRAVENOUS
  Filled 2019-06-16: qty 10

## 2019-06-16 MED ORDER — HEPARIN SOD (PORK) LOCK FLUSH 100 UNIT/ML IV SOLN
500.0000 [IU] | Freq: Once | INTRAVENOUS | Status: AC
  Administered 2019-06-16: 500 [IU] via INTRAVENOUS
  Filled 2019-06-16: qty 5

## 2019-06-16 NOTE — Telephone Encounter (Signed)
1.  His bile duct was cleared of all stones during his March procedure 2.  His gallbladder was not removed and appeared thickened on most recent CT 3.  Pain could be secondary to stones at left his gallbladder since his procedure or secondary to gallbladder inflammation or neither and unrelated RECOMMEND: 1.  MRCP (ASAP) to evaluate for choledocholithiasis 2.  Present to the hospital should he have more severe pains or fevers 3.  May need surgical evaluation depending upon the results of the above.

## 2019-06-16 NOTE — Telephone Encounter (Signed)
Patient called stated Dr. Henrene Pastor did a procedure for him at the hospital (removal of stones in liver) and the patient thinks there might be more stones there please advise.

## 2019-06-16 NOTE — Telephone Encounter (Signed)
Pt had an ERCP in March. States since then he has continued to have pain. Pt is worried he might have a stone still in his bile duct because he still has pain the abdomen all the time, worse with twisting and moving, and mainly on the right side. States he mentioned this to Dr. Earlie Server and was told to follow-up with Dr. Henrene Pastor.  Please advise.

## 2019-06-16 NOTE — Telephone Encounter (Signed)
Pt aware. Scheduled of MRCP of abd at [facility] 06/28/19@8am , pt to arrive there at 7:45am. Pt to be NPO after midnight. Pt aware of appt.

## 2019-06-22 ENCOUNTER — Telehealth: Payer: Self-pay | Admitting: Medical Oncology

## 2019-06-22 NOTE — Telephone Encounter (Signed)
Two separate calls from [facility] staff. "Please fax records so pt can continue care at the [facility] in [place]. Fax to [phone]."  Forwarded message to [place] in HIM.

## 2019-06-23 ENCOUNTER — Inpatient Hospital Stay: Payer: No Typology Code available for payment source

## 2019-06-23 ENCOUNTER — Other Ambulatory Visit: Payer: Self-pay

## 2019-06-23 DIAGNOSIS — C3491 Malignant neoplasm of unspecified part of right bronchus or lung: Secondary | ICD-10-CM

## 2019-06-23 DIAGNOSIS — Z95828 Presence of other vascular implants and grafts: Secondary | ICD-10-CM

## 2019-06-23 DIAGNOSIS — Z5111 Encounter for antineoplastic chemotherapy: Secondary | ICD-10-CM | POA: Diagnosis not present

## 2019-06-23 LAB — CMP (CANCER CENTER ONLY)
ALT: 20 U/L (ref 0–44)
AST: 15 U/L (ref 15–41)
Albumin: 3 g/dL — ABNORMAL LOW (ref 3.5–5.0)
Alkaline Phosphatase: 177 U/L — ABNORMAL HIGH (ref 38–126)
Anion gap: 8 (ref 5–15)
BUN: 13 mg/dL (ref 8–23)
CO2: 21 mmol/L — ABNORMAL LOW (ref 22–32)
Calcium: 8.3 mg/dL — ABNORMAL LOW (ref 8.9–10.3)
Chloride: 106 mmol/L (ref 98–111)
Creatinine: 0.94 mg/dL (ref 0.61–1.24)
GFR, Est AFR Am: >60 mL/min (ref >60)
GFR, Estimated: >60 mL/min (ref >60)
Glucose, Bld: 298 mg/dL — ABNORMAL HIGH (ref 70–99)
Potassium: 4.5 mmol/L (ref 3.5–5.1)
Sodium: 135 mmol/L (ref 135–145)
Total Bilirubin: 0.3 mg/dL (ref 0.3–1.2)
Total Protein: 6.1 g/dL — ABNORMAL LOW (ref 6.5–8.1)

## 2019-06-23 LAB — CBC WITH DIFFERENTIAL (CANCER CENTER ONLY)
Abs Immature Granulocytes: 0.37 10*3/uL — ABNORMAL HIGH (ref 0.00–0.07)
Basophils Absolute: 0 10*3/uL (ref 0.0–0.1)
Basophils Relative: 0 %
Eosinophils Absolute: 0.1 10*3/uL (ref 0.0–0.5)
Eosinophils Relative: 1 %
HCT: 33.8 % — ABNORMAL LOW (ref 39.0–52.0)
Hemoglobin: 11.3 g/dL — ABNORMAL LOW (ref 13.0–17.0)
Immature Granulocytes: 3 %
Lymphocytes Relative: 7 %
Lymphs Abs: 1.1 10*3/uL (ref 0.7–4.0)
MCH: 31.4 pg (ref 26.0–34.0)
MCHC: 33.4 g/dL (ref 30.0–36.0)
MCV: 93.9 fL (ref 80.0–100.0)
Monocytes Absolute: 0.8 10*3/uL (ref 0.1–1.0)
Monocytes Relative: 5 %
Neutro Abs: 12.5 10*3/uL — ABNORMAL HIGH (ref 1.7–7.7)
Neutrophils Relative %: 84 %
Platelet Count: 144 10*3/uL — ABNORMAL LOW (ref 150–400)
RBC: 3.6 MIL/uL — ABNORMAL LOW (ref 4.22–5.81)
RDW: 15.5 % (ref 11.5–15.5)
WBC Count: 15 10*3/uL — ABNORMAL HIGH (ref 4.0–10.5)
nRBC: 0 % (ref 0.0–0.2)

## 2019-06-23 MED ORDER — SODIUM CHLORIDE 0.9% FLUSH
10.0000 mL | INTRAVENOUS | Status: DC | PRN
  Administered 2019-06-23: 10 mL via INTRAVENOUS
  Filled 2019-06-23: qty 10

## 2019-06-23 MED ORDER — HEPARIN SOD (PORK) LOCK FLUSH 100 UNIT/ML IV SOLN
500.0000 [IU] | Freq: Once | INTRAVENOUS | Status: AC
  Administered 2019-06-23: 500 [IU] via INTRAVENOUS
  Filled 2019-06-23: qty 5

## 2019-06-23 NOTE — Progress Notes (Signed)
Patient requested Onpro for C3 only due to traveling post-treatment and inability to return to clinic for GCSF injection D3. This is okay with his insurance per [facility]. Orders updated for C3 only, Tammi to cancel injection appt 5/21.   Demetrius Charity, PharmD, BCPS, [facility] Pharmacist [facility] Phone: [phone] 06/23/2019

## 2019-06-24 NOTE — Progress Notes (Signed)
Pharmacist Chemotherapy Monitoring - Follow Up Assessment    I verify that I have reviewed each item in the below checklist:  . Regimen for the patient is scheduled for the appropriate day and plan matches scheduled date. Marland Kitchen Appropriate non-routine labs are ordered dependent on drug ordered. . If applicable, additional medications reviewed and ordered per protocol based on lifetime cumulative doses and/or treatment regimen.   Plan for follow-up and/or issues identified: No . I-vent associated with next due treatment: Yes . MD and/or nursing notified: No  Dennis Stephens K 06/24/2019 11:11 AM

## 2019-06-28 ENCOUNTER — Ambulatory Visit (HOSPITAL_COMMUNITY): Admission: RE | Admit: 2019-06-28 | Payer: No Typology Code available for payment source | Source: Ambulatory Visit

## 2019-06-29 NOTE — Progress Notes (Signed)
[place] [address] [place] [address]  DIAGNOSIS: Stage IV (T1c, N3, M1 C) non-small cell lung cancer, squamous cell carcinoma presented with bilateral pulmonary nodules in addition to mediastinal lymphadenopathy and liver metastasis diagnosed in March 2021.  PRIOR THERAPY: None  CURRENT THERAPY: Systemic chemotherapy with carboplatin for AUC of 5, paclitaxel 175 mg/M2 and Keytruda 200 mg IV every 3 weeks. First dose May 19, 2019. Status post 2 cycles.   INTERVAL HISTORY: Dennis Stephens 86 y.o. male returns to the clinic today for a follow-up visit.  The patient is feeling well today without any concerning complaints except for fatigue and intermittent RUQ pain. He denies pain at this time but felt some last night when he was laying on his right side.  The patient was concerned this was related to gallstones.  The patient had a gallstone extraction in May 2021 after the care of Dr. Henrene Pastor.  Due to the patient's persistent pain, he recommended that the patient have another MRCP which was initially scheduled for 06/28/2019 patient.  He was supposed to have an MRCP on 5/17 but it was cancelled due to approval from the [place].  The patient is planning to move to [place] tomorrow and will be continuing his oncology care by in [facility] at the [place] in that area.    They cancelled it because the [place] didn't approve.   Otherwise, the patient is feeling fairly well today without any concerning complaints.  He denies any recent fever, chills, night sweats, or weight loss.  He denies any nausea, vomiting, diarrhea, or constipation.  He reports his baseline shortness of breath with exertion but denies any chest pain, cough, or hemoptysis. He also mentions he has allergies in which he takes benadryl. He states he is aware this can cause drowsiness but states it works for him. He states the non-drowsy anti-histamines do not work for him.  He  denies any headache or visual changes.  He denies any rashes or skin changes.  The patient is here today for evaluation before starting cycle #3 of his treatment.  MEDICAL HISTORY: Past Medical History:  Diagnosis Date  . A-fib ([place])   . DM2 (diabetes mellitus, type 2) ([place])   . Hypertension     ALLERGIES:  has No Known Allergies.  MEDICATIONS:  Current Outpatient Medications  Medication Sig Dispense Refill  . amLODipine (NORVASC) 2.5 MG tablet Take 2.5 mg by mouth daily.    Marland Kitchen apixaban (ELIQUIS) 5 MG TABS tablet Take 1 tablet (5 mg total) by mouth 2 (two) times daily. 60 tablet 2  . atorvastatin (LIPITOR) 40 MG tablet Take 40 mg by mouth daily.    . carvedilol (COREG) 12.5 MG tablet Take 12.5 mg by mouth 2 (two) times daily with a meal.    . doxazosin (CARDURA) 8 MG tablet Take 8 mg by mouth daily.    Marland Kitchen glimepiride (AMARYL) 2 MG tablet Take 1 tablet (2 mg total) by mouth daily with breakfast. 30 tablet 11  . lidocaine-prilocaine (EMLA) cream Apply to the Port-A-Cath site 30-60-minute before treatment. 30 g 1  . loratadine (CLARITIN) 10 MG tablet Take 1 tablet (10 mg total) by mouth daily as needed for allergies or rhinitis. 30 tablet 4  . losartan (COZAAR) 100 MG tablet Take 100 mg by mouth daily.    Marland Kitchen omeprazole (PRILOSEC) 20 MG capsule Take 20 mg by mouth daily.    . prochlorperazine (COMPAZINE) 10  MG tablet TAKE 1 TABLET(10 MG) BY MOUTH EVERY 6 HOURS AS NEEDED FOR NAUSEA OR VOMITING 30 tablet 0  . sertraline (ZOLOFT) 100 MG tablet Take 100 mg by mouth daily.    . temazepam (RESTORIL) 30 MG capsule Take 30 mg by mouth at bedtime as needed for sleep.     No current facility-administered medications for this visit.   Facility-Administered Medications Ordered in Other Visits  Medication Dose Route Frequency Provider Last Rate Last Admin  . 0.9 %  sodium chloride infusion   Intravenous Once Curt Bears, MD      . CARBOplatin (PARAPLATIN) 490 mg in sodium chloride 0.9 % 250 mL  chemo infusion  490 mg Intravenous Once Curt Bears, MD      . dexamethasone (DECADRON) 10 mg in sodium chloride 0.9 % 50 mL IVPB  10 mg Intravenous Once Curt Bears, MD      . diphenhydrAMINE (BENADRYL) injection 50 mg  50 mg Intravenous Once Curt Bears, MD      . famotidine (PEPCID) IVPB 20 mg premix  20 mg Intravenous Once Curt Bears, MD      . fosaprepitant (EMEND) 150 mg in sodium chloride 0.9 % 145 mL IVPB  150 mg Intravenous Once Curt Bears, MD      . heparin lock flush 100 unit/mL  500 Units Intracatheter Once PRN Curt Bears, MD      . PACLitaxel (TAXOL) 372 mg in sodium chloride 0.9 % 500 mL chemo infusion (> 52m/m2)  175 mg/m2 (Treatment Plan Recorded) Intravenous Once MCurt Bears MD      . palonosetron (ALOXI) injection 0.25 mg  0.25 mg Intravenous Once MCurt Bears MD      . pegfilgrastim (NEULASTA ONPRO KIT) injection 6 mg  6 mg Subcutaneous Once MCurt Bears MD      . pembrolizumab ([facility] 200 mg in sodium chloride 0.9 % 50 mL chemo infusion  200 mg Intravenous Once MCurt Bears MD      . sodium chloride flush (NS) 0.9 % injection 10 mL  10 mL Intracatheter PRN MCurt Bears MD        SURGICAL HISTORY:  Past Surgical History:  Procedure Laterality Date  . BRONCHIAL BIOPSY  04/28/2019   Procedure: BRONCHIAL BIOPSIES;  Surgeon: IGarner Nash DO;  [facility];  Service: Pulmonary;;  . BRONCHIAL BRUSHINGS  04/28/2019   Procedure: BRONCHIAL BRUSHINGS;  Surgeon: IGarner Nash DO;  [facility];  Service: Pulmonary;;  . BRONCHIAL NEEDLE ASPIRATION BIOPSY  04/28/2019   Procedure: BRONCHIAL NEEDLE ASPIRATION BIOPSIES;  Surgeon: IGarner Nash DO;  [facility];  Service: Pulmonary;;  . BRONCHIAL WASHINGS  04/28/2019   Procedure: BRONCHIAL WASHINGS;  Surgeon: IGarner Nash DO;  [facility] [facility]  Service: Pulmonary;;  . ENDOBRONCHIAL ULTRASOUND N/A 04/28/2019   Procedure:  ENDOBRONCHIAL ULTRASOUND;  Surgeon: IGarner Nash DO;  [place] [facility]  Service: Pulmonary;  Laterality: N/A;  . ERCP N/A 04/27/2019   Procedure: ENDOSCOPIC RETROGRADE CHOLANGIOPANCREATOGRAPHY (ERCP);  Surgeon: PIrene Shipper MD;  [facility];  Service: Endoscopy;  Laterality: N/A;  . ESOPHAGOGASTRODUODENOSCOPY (EGD) WITH PROPOFOL N/A 05/04/2019   Procedure: ESOPHAGOGASTRODUODENOSCOPY (EGD) WITH PROPOFOL;  Surgeon: PIrene Shipper MD;  [facility];  Service: Endoscopy;  Laterality: N/A;  . IR IMAGING GUIDED PORT INSERTION  05/21/2019  . LIPOMA EXCISION    . REMOVAL OF STONES  04/27/2019   Procedure: REMOVAL OF STONES;  Surgeon: PIrene Shipper MD;  [facility];  Service: Endoscopy;;  . SPHINCTEROTOMY  04/27/2019   Procedure: SPHINCTEROTOMY;  Surgeon: Irene Shipper, MD;  [facility];  Service: Endoscopy;;  . VIDEO BRONCHOSCOPY WITH ENDOBRONCHIAL NAVIGATION N/A 04/28/2019   Procedure: VIDEO BRONCHOSCOPY WITH ENDOBRONCHIAL NAVIGATION;  Surgeon: Garner Nash, DO;  Location: [place];  Service: Pulmonary;  Laterality: N/A;    REVIEW OF SYSTEMS:   Review of Systems  Constitutional: Positive for fatigue. Negative for appetite change, chills, fever and unexpected weight change.  HENT: Negative for mouth sores, nosebleeds, sore throat and trouble swallowing.   Eyes: Negative for eye problems and icterus.  Respiratory: Positive for baseline dyspnea on exertion. Negative for cough, hemoptysis,  and wheezing.   Cardiovascular: Negative for chest pain and leg swelling.  Gastrointestinal: Positive for persistent RUQ pain (none at this time). Negative for  constipation, diarrhea, nausea and vomiting.  Genitourinary: Negative for bladder incontinence, difficulty urinating, dysuria, frequency and hematuria.   Musculoskeletal: Negative for back pain, gait problem, neck pain and neck stiffness.  Skin: Negative for itching and rash.  Neurological: Negative  for dizziness, extremity weakness, gait problem, headaches, light-headedness and seizures.  Hematological: Negative for adenopathy. Does not bruise/bleed easily.  Psychiatric/Behavioral: Negative for confusion, depression and sleep disturbance. The patient is not nervous/anxious.     PHYSICAL EXAMINATION:  Blood pressure (!) 118/58, pulse 61, temperature (!) 97.5 F (36.4 C), temperature source Temporal, resp. rate 17, height _0  (1.727 m), weight 205 lb 14.4 oz (93.4 kg), SpO2 100 %.  ECOG PERFORMANCE STATUS: 2 - Symptomatic, <50% confined to bed  Physical Exam  Constitutional: Oriented to person, place, and time and well-developed, well-nourished, and in no distress.  HENT:  Head: Normocephalic and atraumatic.  Mouth/Throat: Oropharynx is clear and moist. No oropharyngeal exudate.  Eyes: Conjunctivae are normal. Right eye exhibits no discharge. Left eye exhibits no discharge. No scleral icterus.  Neck: Normal range of motion. Neck supple.  Cardiovascular: Normal rate, regular rhythm, normal heart sounds and intact distal pulses.   Pulmonary/Chest: Effort normal and breath sounds normal. No respiratory distress. No wheezes. No rales.  Abdominal: Soft. Bowel sounds are normal. Exhibits no distension and no mass. There is no tenderness.  Musculoskeletal: Normal range of motion. Exhibits no edema.  Lymphadenopathy:    No cervical adenopathy.  Neurological: Alert and oriented to person, place, and time. Exhibits normal muscle tone. Examined in the wheelchair.  Skin: Skin is warm and dry. No rash noted. Not diaphoretic. No erythema. No pallor.  Psychiatric: Mood, memory and judgment normal.  Vitals reviewed.  LABORATORY DATA: Lab Results  Component Value Date   WBC 10.2 06/30/2019   HGB 11.7 (L) 06/30/2019   HCT 34.4 (L) 06/30/2019   MCV 93.0 06/30/2019   PLT 230 06/30/2019      Chemistry      Component Value Date/Time   NA 134 (L) 06/30/2019 1005   K 4.7 06/30/2019 1005    CL 106 06/30/2019 1005   CO2 20 (L) 06/30/2019 1005   BUN 16 06/30/2019 1005   CREATININE 1.07 06/30/2019 1005      Component Value Date/Time   CALCIUM 8.6 (L) 06/30/2019 1005   ALKPHOS 148 (H) 06/30/2019 1005   AST 17 06/30/2019 1005   ALT 22 06/30/2019 1005   BILITOT 0.3 06/30/2019 1005       RADIOGRAPHIC STUDIES:  No results found.   ASSESSMENT/PLAN:  This is a very pleasant 86 year old Caucasian male recently diagnosed with stage IV (T1c, and 3, and 1C)  non-small cell lung cancer, squamous cell carcinoma.  He presented with bilateral pulmonary nodules in addition to bilateral mediastinal lymphadenopathy and suspicious liver metastases.  He was diagnosed in March 2021.   The patient is currently undergoing treatment with systemic chemotherapy with carboplatin for an AUC of 5, paclitaxel 175 mg/m, Keytruda 200 mg IV every 3 weeks with Neulasta support.  He is status post 2 cycles and he tolerated it well.   The patient was seen with Dr. Julien Nordmann today.  Labs were reviewed.  Recommend that he proceed with cycle #3 scheduled. He will receive onpro today instead of coming in on day 3 for the neulasta injection since he is moving tomorrow.    He will be continuing his cancer care under at the [facility] near [facility]  His records have been transferred to their facility.   We would typically order a restaging CT scan of his chest, abdomen, and pelvis prior to receiving his next cycle of treatment and would recommend to have this performed once he begins his care in [place].   We will not schedule any follow-up appointments at this time but would be happy to assist in the patient's transition. We will fax today's note to the new facility.   The patient denied any abdominal pain at his visit today. There was no tenderness to palpation today or any abnormalities on exam today. Patient afebrile. Patient will establish care with gastroenterology in [place] but knows to go to the ER if any  new or worsening symptoms.   The patient was advised to call immediately if he has any concerning symptoms in the interval. The patient voices understanding of current disease status and treatment options and is in agreement with the current care plan. All questions were answered. The patient knows to call the clinic with any problems, questions or concerns. We can certainly see the patient much sooner if necessary  No orders of the defined types were placed in this encounter.    Cassandra L Heilingoetter, PA-C 06/30/19  ADDENDUM: Hematology/oncology Attending: I had a face-to-face encounter with the patient today.  I recommended his care plan.  This is a very pleasant 86 years old white male with metastatic non-small cell lung cancer, squamous cell carcinoma.  The patient is currently undergoing systemic chemotherapy with carboplatin, paclitaxel and Keytruda status post 2 cycles.  He has been tolerating this treatment well with no concerning adverse effects. He is moving to [place] tomorrow and will resume his treatment and further evaluation at the [facility] facility at [facility]. I recommended for the patient to proceed with his treatment with cycle #3 today as planned. He will have Onpro today for neutropenic prophylaxis. We wish the patient as the best with his new relocation. He was advised to call if he has any concerning issues in the interval.  Disclaimer: This note was dictated with voice recognition software. Similar sounding words can inadvertently be transcribed and may be missed upon review. Eilleen Kempf, MD 06/30/19

## 2019-06-30 ENCOUNTER — Inpatient Hospital Stay: Payer: No Typology Code available for payment source

## 2019-06-30 ENCOUNTER — Other Ambulatory Visit: Payer: Self-pay

## 2019-06-30 ENCOUNTER — Encounter: Payer: Self-pay | Admitting: Physician Assistant

## 2019-06-30 ENCOUNTER — Inpatient Hospital Stay (HOSPITAL_BASED_OUTPATIENT_CLINIC_OR_DEPARTMENT_OTHER): Payer: No Typology Code available for payment source | Admitting: Physician Assistant

## 2019-06-30 ENCOUNTER — Telehealth: Payer: Self-pay | Admitting: *Deleted

## 2019-06-30 VITALS — BP 118/58 | HR 61 | Temp 97.5°F | Resp 17 | Ht 68.0 in | Wt 205.9 lb

## 2019-06-30 DIAGNOSIS — C3491 Malignant neoplasm of unspecified part of right bronchus or lung: Secondary | ICD-10-CM

## 2019-06-30 DIAGNOSIS — Z5111 Encounter for antineoplastic chemotherapy: Secondary | ICD-10-CM | POA: Diagnosis not present

## 2019-06-30 DIAGNOSIS — Z5112 Encounter for antineoplastic immunotherapy: Secondary | ICD-10-CM

## 2019-06-30 LAB — CMP (CANCER CENTER ONLY)
ALT: 22 U/L (ref 0–44)
AST: 17 U/L (ref 15–41)
Albumin: 3.2 g/dL — ABNORMAL LOW (ref 3.5–5.0)
Alkaline Phosphatase: 148 U/L — ABNORMAL HIGH (ref 38–126)
Anion gap: 8 (ref 5–15)
BUN: 16 mg/dL (ref 8–23)
CO2: 20 mmol/L — ABNORMAL LOW (ref 22–32)
Calcium: 8.6 mg/dL — ABNORMAL LOW (ref 8.9–10.3)
Chloride: 106 mmol/L (ref 98–111)
Creatinine: 1.07 mg/dL (ref 0.61–1.24)
GFR, Est AFR Am: >60 mL/min (ref >60)
GFR, Estimated: >60 mL/min (ref >60)
Glucose, Bld: 267 mg/dL — ABNORMAL HIGH (ref 70–99)
Potassium: 4.7 mmol/L (ref 3.5–5.1)
Sodium: 134 mmol/L — ABNORMAL LOW (ref 135–145)
Total Bilirubin: 0.3 mg/dL (ref 0.3–1.2)
Total Protein: 6.4 g/dL — ABNORMAL LOW (ref 6.5–8.1)

## 2019-06-30 LAB — CBC WITH DIFFERENTIAL (CANCER CENTER ONLY)
Abs Immature Granulocytes: 0.1 10*3/uL — ABNORMAL HIGH (ref 0.00–0.07)
Basophils Absolute: 0.1 10*3/uL (ref 0.0–0.1)
Basophils Relative: 1 %
Eosinophils Absolute: 0.1 10*3/uL (ref 0.0–0.5)
Eosinophils Relative: 1 %
HCT: 34.4 % — ABNORMAL LOW (ref 39.0–52.0)
Hemoglobin: 11.7 g/dL — ABNORMAL LOW (ref 13.0–17.0)
Immature Granulocytes: 1 %
Lymphocytes Relative: 11 %
Lymphs Abs: 1.1 10*3/uL (ref 0.7–4.0)
MCH: 31.6 pg (ref 26.0–34.0)
MCHC: 34 g/dL (ref 30.0–36.0)
MCV: 93 fL (ref 80.0–100.0)
Monocytes Absolute: 1.2 10*3/uL — ABNORMAL HIGH (ref 0.1–1.0)
Monocytes Relative: 11 %
Neutro Abs: 7.7 10*3/uL (ref 1.7–7.7)
Neutrophils Relative %: 75 %
Platelet Count: 230 10*3/uL (ref 150–400)
RBC: 3.7 MIL/uL — ABNORMAL LOW (ref 4.22–5.81)
RDW: 15.2 % (ref 11.5–15.5)
WBC Count: 10.2 10*3/uL (ref 4.0–10.5)
nRBC: 0 % (ref 0.0–0.2)

## 2019-06-30 MED ORDER — FAMOTIDINE IN NACL 20-0.9 MG/50ML-% IV SOLN
20.0000 mg | Freq: Once | INTRAVENOUS | Status: AC
  Administered 2019-06-30: 20 mg via INTRAVENOUS

## 2019-06-30 MED ORDER — PALONOSETRON HCL INJECTION 0.25 MG/5ML
INTRAVENOUS | Status: AC
  Filled 2019-06-30: qty 5

## 2019-06-30 MED ORDER — SODIUM CHLORIDE 0.9 % IV SOLN
492.0000 mg | Freq: Once | INTRAVENOUS | Status: AC
  Administered 2019-06-30: 490 mg via INTRAVENOUS
  Filled 2019-06-30: qty 49

## 2019-06-30 MED ORDER — HEPARIN SOD (PORK) LOCK FLUSH 100 UNIT/ML IV SOLN
500.0000 [IU] | Freq: Once | INTRAVENOUS | Status: DC | PRN
  Filled 2019-06-30: qty 5

## 2019-06-30 MED ORDER — FAMOTIDINE IN NACL 20-0.9 MG/50ML-% IV SOLN
INTRAVENOUS | Status: AC
  Filled 2019-06-30: qty 50

## 2019-06-30 MED ORDER — SODIUM CHLORIDE 0.9% FLUSH
10.0000 mL | INTRAVENOUS | Status: DC | PRN
  Filled 2019-06-30: qty 10

## 2019-06-30 MED ORDER — PEGFILGRASTIM 6 MG/0.6ML ~~LOC~~ PSKT
6.0000 mg | PREFILLED_SYRINGE | Freq: Once | SUBCUTANEOUS | Status: AC
  Administered 2019-06-30: 6 mg via SUBCUTANEOUS

## 2019-06-30 MED ORDER — SODIUM CHLORIDE 0.9 % IV SOLN
175.0000 mg/m2 | Freq: Once | INTRAVENOUS | Status: AC
  Administered 2019-06-30: 372 mg via INTRAVENOUS
  Filled 2019-06-30: qty 62

## 2019-06-30 MED ORDER — SODIUM CHLORIDE 0.9 % IV SOLN
200.0000 mg | Freq: Once | INTRAVENOUS | Status: AC
  Administered 2019-06-30: 200 mg via INTRAVENOUS
  Filled 2019-06-30: qty 8

## 2019-06-30 MED ORDER — DIPHENHYDRAMINE HCL 50 MG/ML IJ SOLN
50.0000 mg | Freq: Once | INTRAMUSCULAR | Status: AC
  Administered 2019-06-30: 50 mg via INTRAVENOUS

## 2019-06-30 MED ORDER — SODIUM CHLORIDE 0.9 % IV SOLN
150.0000 mg | Freq: Once | INTRAVENOUS | Status: AC
  Administered 2019-06-30: 150 mg via INTRAVENOUS
  Filled 2019-06-30: qty 150

## 2019-06-30 MED ORDER — PALONOSETRON HCL INJECTION 0.25 MG/5ML
0.2500 mg | Freq: Once | INTRAVENOUS | Status: AC
  Administered 2019-06-30: 0.25 mg via INTRAVENOUS

## 2019-06-30 MED ORDER — PEGFILGRASTIM 6 MG/0.6ML ~~LOC~~ PSKT
PREFILLED_SYRINGE | SUBCUTANEOUS | Status: AC
  Filled 2019-06-30: qty 0.6

## 2019-06-30 MED ORDER — SODIUM CHLORIDE 0.9 % IV SOLN
10.0000 mg | Freq: Once | INTRAVENOUS | Status: AC
  Administered 2019-06-30: 10 mg via INTRAVENOUS
  Filled 2019-06-30: qty 10

## 2019-06-30 MED ORDER — SODIUM CHLORIDE 0.9 % IV SOLN
Freq: Once | INTRAVENOUS | Status: AC
  Filled 2019-06-30: qty 250

## 2019-06-30 MED ORDER — DIPHENHYDRAMINE HCL 50 MG/ML IJ SOLN
INTRAMUSCULAR | Status: AC
  Filled 2019-06-30: qty 1

## 2019-06-30 NOTE — Telephone Encounter (Signed)
Faxed Cassie/Dr [facility] office notes to the [place]

## 2019-06-30 NOTE — Patient Instructions (Signed)
[facility] Discharge Instructions for Patients Receiving Chemotherapy  Today you received the following chemotherapy agents Keytruda, Paclitaxel, Carboplatin  To help prevent nausea and vomiting after your treatment, we encourage you to take your nausea medication as prescribed.   If you develop nausea and vomiting that is not controlled by your nausea medication, call the clinic.   BELOW ARE SYMPTOMS THAT SHOULD BE REPORTED IMMEDIATELY:  *FEVER GREATER THAN 100.5 F  *CHILLS WITH OR WITHOUT FEVER  NAUSEA AND VOMITING THAT IS NOT CONTROLLED WITH YOUR NAUSEA MEDICATION  *UNUSUAL SHORTNESS OF BREATH  *UNUSUAL BRUISING OR BLEEDING  TENDERNESS IN MOUTH AND THROAT WITH OR WITHOUT PRESENCE OF ULCERS  *URINARY PROBLEMS  *BOWEL PROBLEMS  UNUSUAL RASH Items with * indicate a potential emergency and should be followed up as soon as possible.  Feel free to call the clinic should you have any questions or concerns. The clinic phone number is [phone].  Please show the Guy at check-in to the Emergency Department and triage nurse.  Pembrolizumab injection What is this medicine? PEMBROLIZUMAB (pem broe liz ue mab) is a monoclonal antibody. It is used to treat certain types of cancer. This medicine may be used for other purposes; ask your health care provider or pharmacist if you have questions. COMMON BRAND NAME(S): Keytruda What should I tell my health care provider before I take this medicine? They need to know if you have any of these conditions:  diabetes  immune system problems  inflammatory bowel disease  liver disease  lung or breathing disease  lupus  received or scheduled to receive an organ transplant or a stem-cell transplant that uses donor stem cells  an unusual or allergic reaction to pembrolizumab, other medicines, foods, dyes, or preservatives  pregnant or trying to get pregnant  breast-feeding How should I use this  medicine? This medicine is for infusion into a vein. It is given by a health care professional in a hospital or clinic setting. A special MedGuide will be given to you before each treatment. Be sure to read this information carefully each time. Talk to your pediatrician regarding the use of this medicine in children. While this drug may be prescribed for children as young as 6 months for selected conditions, precautions do apply. Overdosage: If you think you have taken too much of this medicine contact a poison control center or emergency room at once. NOTE: This medicine is only for you. Do not share this medicine with others. What if I miss a dose? It is important not to miss your dose. Call your doctor or health care professional if you are unable to keep an appointment. What may interact with this medicine? Interactions have not been studied. Give your health care provider a list of all the medicines, herbs, non-prescription drugs, or dietary supplements you use. Also tell them if you smoke, drink alcohol, or use illegal drugs. Some items may interact with your medicine. This list may not describe all possible interactions. Give your health care provider a list of all the medicines, herbs, non-prescription drugs, or dietary supplements you use. Also tell them if you smoke, drink alcohol, or use illegal drugs. Some items may interact with your medicine. What should I watch for while using this medicine? Your condition will be monitored carefully while you are receiving this medicine. You may need blood work done while you are taking this medicine. Do not become pregnant while taking this medicine or for 4 months after stopping it.  Women should inform their doctor if they wish to become pregnant or think they might be pregnant. There is a potential for serious side effects to an unborn child. Talk to your health care professional or pharmacist for more information. Do not breast-feed an infant while  taking this medicine or for 4 months after the last dose. What side effects may I notice from receiving this medicine? Side effects that you should report to your doctor or health care professional as soon as possible:  allergic reactions like skin rash, itching or hives, swelling of the face, lips, or tongue  bloody or black, tarry  breathing problems  changes in vision  chest pain  chills  confusion  constipation  cough  diarrhea  dizziness or feeling faint or lightheaded  fast or irregular heartbeat  fever  flushing  joint pain  low blood counts - this medicine may decrease the number of white blood cells, red blood cells and platelets. You may be at increased risk for infections and bleeding.  muscle pain  muscle weakness  pain, tingling, numbness in the hands or feet  persistent headache  redness, blistering, peeling or loosening of the skin, including inside the mouth  signs and symptoms of high blood sugar such as dizziness; dry mouth; dry skin; fruity breath; nausea; stomach pain; increased hunger or thirst; increased urination  signs and symptoms of kidney injury like trouble passing urine or change in the amount of urine  signs and symptoms of liver injury like dark urine, light-colored stools, loss of appetite, nausea, right upper belly pain, yellowing of the eyes or skin  sweating  swollen lymph nodes  weight loss Side effects that usually do not require medical attention (report to your doctor or health care professional if they continue or are bothersome):  decreased appetite  hair loss  muscle pain  tiredness This list may not describe all possible side effects. Call your doctor for medical advice about side effects. You may report side effects to FDA at 1-800-FDA-1088. Where should I keep my medicine? This drug is given in a hospital or clinic and will not be stored at home. NOTE: This sheet is a summary. It may not cover all  possible information. If you have questions about this medicine, talk to your doctor, pharmacist, or health care provider.  2020 Elsevier/Gold Standard (2018-12-04 18:07:58)  Paclitaxel injection What is this medicine? PACLITAXEL (PAK li TAX el) is a chemotherapy drug. It targets fast dividing cells, like cancer cells, and causes these cells to die. This medicine is used to treat ovarian cancer, breast cancer, lung cancer, Kaposi's sarcoma, and other cancers. This medicine may be used for other purposes; ask your health care provider or pharmacist if you have questions. COMMON BRAND NAME(S): Onxol, Taxol What should I tell my health care provider before I take this medicine? They need to know if you have any of these conditions:  history of irregular heartbeat  liver disease  low blood counts, like low white cell, platelet, or red cell counts  lung or breathing disease, like asthma  tingling of the fingers or toes, or other nerve disorder  an unusual or allergic reaction to paclitaxel, alcohol, polyoxyethylated castor oil, other chemotherapy, other medicines, foods, dyes, or preservatives  pregnant or trying to get pregnant  breast-feeding How should I use this medicine? This drug is given as an infusion into a vein. It is administered in a hospital or clinic by a specially trained health care professional. Talk to  your pediatrician regarding the use of this medicine in children. Special care may be needed. Overdosage: If you think you have taken too much of this medicine contact a poison control center or emergency room at once. NOTE: This medicine is only for you. Do not share this medicine with others. What if I miss a dose? It is important not to miss your dose. Call your doctor or health care professional if you are unable to keep an appointment. What may interact with this medicine? Do not take this medicine with any of the following  medications:  disulfiram  metronidazole This medicine may also interact with the following medications:  antiviral medicines for hepatitis, HIV or AIDS  certain antibiotics like erythromycin and clarithromycin  certain medicines for fungal infections like ketoconazole and itraconazole  certain medicines for seizures like carbamazepine, phenobarbital, phenytoin  gemfibrozil  nefazodone  rifampin  St. John's wort This list may not describe all possible interactions. Give your health care provider a list of all the medicines, herbs, non-prescription drugs, or dietary supplements you use. Also tell them if you smoke, drink alcohol, or use illegal drugs. Some items may interact with your medicine. What should I watch for while using this medicine? Your condition will be monitored carefully while you are receiving this medicine. You will need important blood work done while you are taking this medicine. This medicine can cause serious allergic reactions. To reduce your risk you will need to take other medicine(s) before treatment with this medicine. If you experience allergic reactions like skin rash, itching or hives, swelling of the face, lips, or tongue, tell your doctor or health care professional right away. In some cases, you may be given additional medicines to help with side effects. Follow all directions for their use. This drug may make you feel generally unwell. This is not uncommon, as chemotherapy can affect healthy cells as well as cancer cells. Report any side effects. Continue your course of treatment even though you feel ill unless your doctor tells you to stop. Call your doctor or health care professional for advice if you get a fever, chills or sore throat, or other symptoms of a cold or flu. Do not treat yourself. This drug decreases your body's ability to fight infections. Try to avoid being around people who are sick. This medicine may increase your risk to bruise or  bleed. Call your doctor or health care professional if you notice any unusual bleeding. Be careful brushing and flossing your teeth or using a toothpick because you may get an infection or bleed more easily. If you have any dental work done, tell your dentist you are receiving this medicine. Avoid taking products that contain aspirin, acetaminophen, ibuprofen, naproxen, or ketoprofen unless instructed by your doctor. These medicines may hide a fever. Do not become pregnant while taking this medicine. Women should inform their doctor if they wish to become pregnant or think they might be pregnant. There is a potential for serious side effects to an unborn child. Talk to your health care professional or pharmacist for more information. Do not breast-feed an infant while taking this medicine. Men are advised not to father a child while receiving this medicine. This product may contain alcohol. Ask your pharmacist or healthcare provider if this medicine contains alcohol. Be sure to tell all healthcare providers you are taking this medicine. Certain medicines, like metronidazole and disulfiram, can cause an unpleasant reaction when taken with alcohol. The reaction includes flushing, headache, nausea, vomiting, sweating, and  increased thirst. The reaction can last from 30 minutes to several hours. What side effects may I notice from receiving this medicine? Side effects that you should report to your doctor or health care professional as soon as possible:  allergic reactions like skin rash, itching or hives, swelling of the face, lips, or tongue  breathing problems  changes in vision  fast, irregular heartbeat  high or low blood pressure  mouth sores  pain, tingling, numbness in the hands or feet  signs of decreased platelets or bleeding - bruising, pinpoint red spots on the skin, black, tarry stools, blood in the urine  signs of decreased red blood cells - unusually weak or tired, feeling faint  or lightheaded, falls  signs of infection - fever or chills, cough, sore throat, pain or difficulty passing urine  signs and symptoms of liver injury like dark yellow or brown urine; general ill feeling or flu-like symptoms; light-colored stools; loss of appetite; nausea; right upper belly pain; unusually weak or tired; yellowing of the eyes or skin  swelling of the ankles, feet, hands  unusually slow heartbeat Side effects that usually do not require medical attention (report to your doctor or health care professional if they continue or are bothersome):  diarrhea  hair loss  loss of appetite  muscle or joint pain  nausea, vomiting  pain, redness, or irritation at site where injected  tiredness This list may not describe all possible side effects. Call your doctor for medical advice about side effects. You may report side effects to FDA at 1-800-FDA-1088. Where should I keep my medicine? This drug is given in a hospital or clinic and will not be stored at home. NOTE: This sheet is a summary. It may not cover all possible information. If you have questions about this medicine, talk to your doctor, pharmacist, or health care provider.  2020 Elsevier/Gold Standard (2016-10-01 13:14:55)   Carboplatin injection What is this medicine? CARBOPLATIN (KAR boe pla tin) is a chemotherapy drug. It targets fast dividing cells, like cancer cells, and causes these cells to die. This medicine is used to treat ovarian cancer and many other cancers. This medicine may be used for other purposes; ask your health care provider or pharmacist if you have questions. COMMON BRAND NAME(S): Paraplatin What should I tell my health care provider before I take this medicine? They need to know if you have any of these conditions:  blood disorders  hearing problems  kidney disease  recent or ongoing radiation therapy  an unusual or allergic reaction to carboplatin, cisplatin, other chemotherapy,  other medicines, foods, dyes, or preservatives  pregnant or trying to get pregnant  breast-feeding How should I use this medicine? This drug is usually given as an infusion into a vein. It is administered in a hospital or clinic by a specially trained health care professional. Talk to your pediatrician regarding the use of this medicine in children. Special care may be needed. Overdosage: If you think you have taken too much of this medicine contact a poison control center or emergency room at once. NOTE: This medicine is only for you. Do not share this medicine with others. What if I miss a dose? It is important not to miss a dose. Call your doctor or health care professional if you are unable to keep an appointment. What may interact with this medicine?  medicines for seizures  medicines to increase blood counts like filgrastim, pegfilgrastim, sargramostim  some antibiotics like amikacin, gentamicin, neomycin, streptomycin, tobramycin  vaccines Talk to your doctor or health care professional before taking any of these medicines:  acetaminophen  aspirin  ibuprofen  ketoprofen  naproxen This list may not describe all possible interactions. Give your health care provider a list of all the medicines, herbs, non-prescription drugs, or dietary supplements you use. Also tell them if you smoke, drink alcohol, or use illegal drugs. Some items may interact with your medicine. What should I watch for while using this medicine? Your condition will be monitored carefully while you are receiving this medicine. You will need important blood work done while you are taking this medicine. This drug may make you feel generally unwell. This is not uncommon, as chemotherapy can affect healthy cells as well as cancer cells. Report any side effects. Continue your course of treatment even though you feel ill unless your doctor tells you to stop. In some cases, you may be given additional medicines to  help with side effects. Follow all directions for their use. Call your doctor or health care professional for advice if you get a fever, chills or sore throat, or other symptoms of a cold or flu. Do not treat yourself. This drug decreases your body's ability to fight infections. Try to avoid being around people who are sick. This medicine may increase your risk to bruise or bleed. Call your doctor or health care professional if you notice any unusual bleeding. Be careful brushing and flossing your teeth or using a toothpick because you may get an infection or bleed more easily. If you have any dental work done, tell your dentist you are receiving this medicine. Avoid taking products that contain aspirin, acetaminophen, ibuprofen, naproxen, or ketoprofen unless instructed by your doctor. These medicines may hide a fever. Do not become pregnant while taking this medicine. Women should inform their doctor if they wish to become pregnant or think they might be pregnant. There is a potential for serious side effects to an unborn child. Talk to your health care professional or pharmacist for more information. Do not breast-feed an infant while taking this medicine. What side effects may I notice from receiving this medicine? Side effects that you should report to your doctor or health care professional as soon as possible:  allergic reactions like skin rash, itching or hives, swelling of the face, lips, or tongue  signs of infection - fever or chills, cough, sore throat, pain or difficulty passing urine  signs of decreased platelets or bleeding - bruising, pinpoint red spots on the skin, black, tarry stools, nosebleeds  signs of decreased red blood cells - unusually weak or tired, fainting spells, lightheadedness  breathing problems  changes in hearing  changes in vision  chest pain  high blood pressure  low blood counts - This drug may decrease the number of white blood cells, red blood cells  and platelets. You may be at increased risk for infections and bleeding.  nausea and vomiting  pain, swelling, redness or irritation at the injection site  pain, tingling, numbness in the hands or feet  problems with balance, talking, walking  trouble passing urine or change in the amount of urine Side effects that usually do not require medical attention (report to your doctor or health care professional if they continue or are bothersome):  hair loss  loss of appetite  metallic taste in the mouth or changes in taste This list may not describe all possible side effects. Call your doctor for medical advice about side effects. You may report side  effects to FDA at 1-800-FDA-1088. Where should I keep my medicine? This drug is given in a hospital or clinic and will not be stored at home. NOTE: This sheet is a summary. It may not cover all possible information. If you have questions about this medicine, talk to your doctor, pharmacist, or health care provider.  2020 Elsevier/Gold Standard (2007-05-05 14:38:05)

## 2019-07-02 ENCOUNTER — Ambulatory Visit: Payer: No Typology Code available for payment source

## 2019-07-06 ENCOUNTER — Telehealth: Payer: Self-pay | Admitting: Internal Medicine

## 2019-07-06 NOTE — Telephone Encounter (Signed)
Faxed to [facility] @ [phone]

## 2019-07-07 ENCOUNTER — Other Ambulatory Visit: Payer: No Typology Code available for payment source

## 2019-07-14 ENCOUNTER — Other Ambulatory Visit: Payer: No Typology Code available for payment source

## 2019-07-20 ENCOUNTER — Ambulatory Visit: Payer: No Typology Code available for payment source

## 2019-07-20 ENCOUNTER — Ambulatory Visit: Payer: No Typology Code available for payment source | Admitting: Physician Assistant

## 2019-07-20 ENCOUNTER — Other Ambulatory Visit: Payer: No Typology Code available for payment source

## 2019-07-22 ENCOUNTER — Ambulatory Visit: Payer: No Typology Code available for payment source

## 2021-05-25 IMAGING — CR DG CHEST 2V
2 series · 2 of 2 positions shown · non-contrast
Comparison: 01/20/2019 CT

CLINICAL DATA: Chest pain for several hours, history of lung
carcinoma

EXAM:
CHEST - 2 VIEW

[w chest lat]
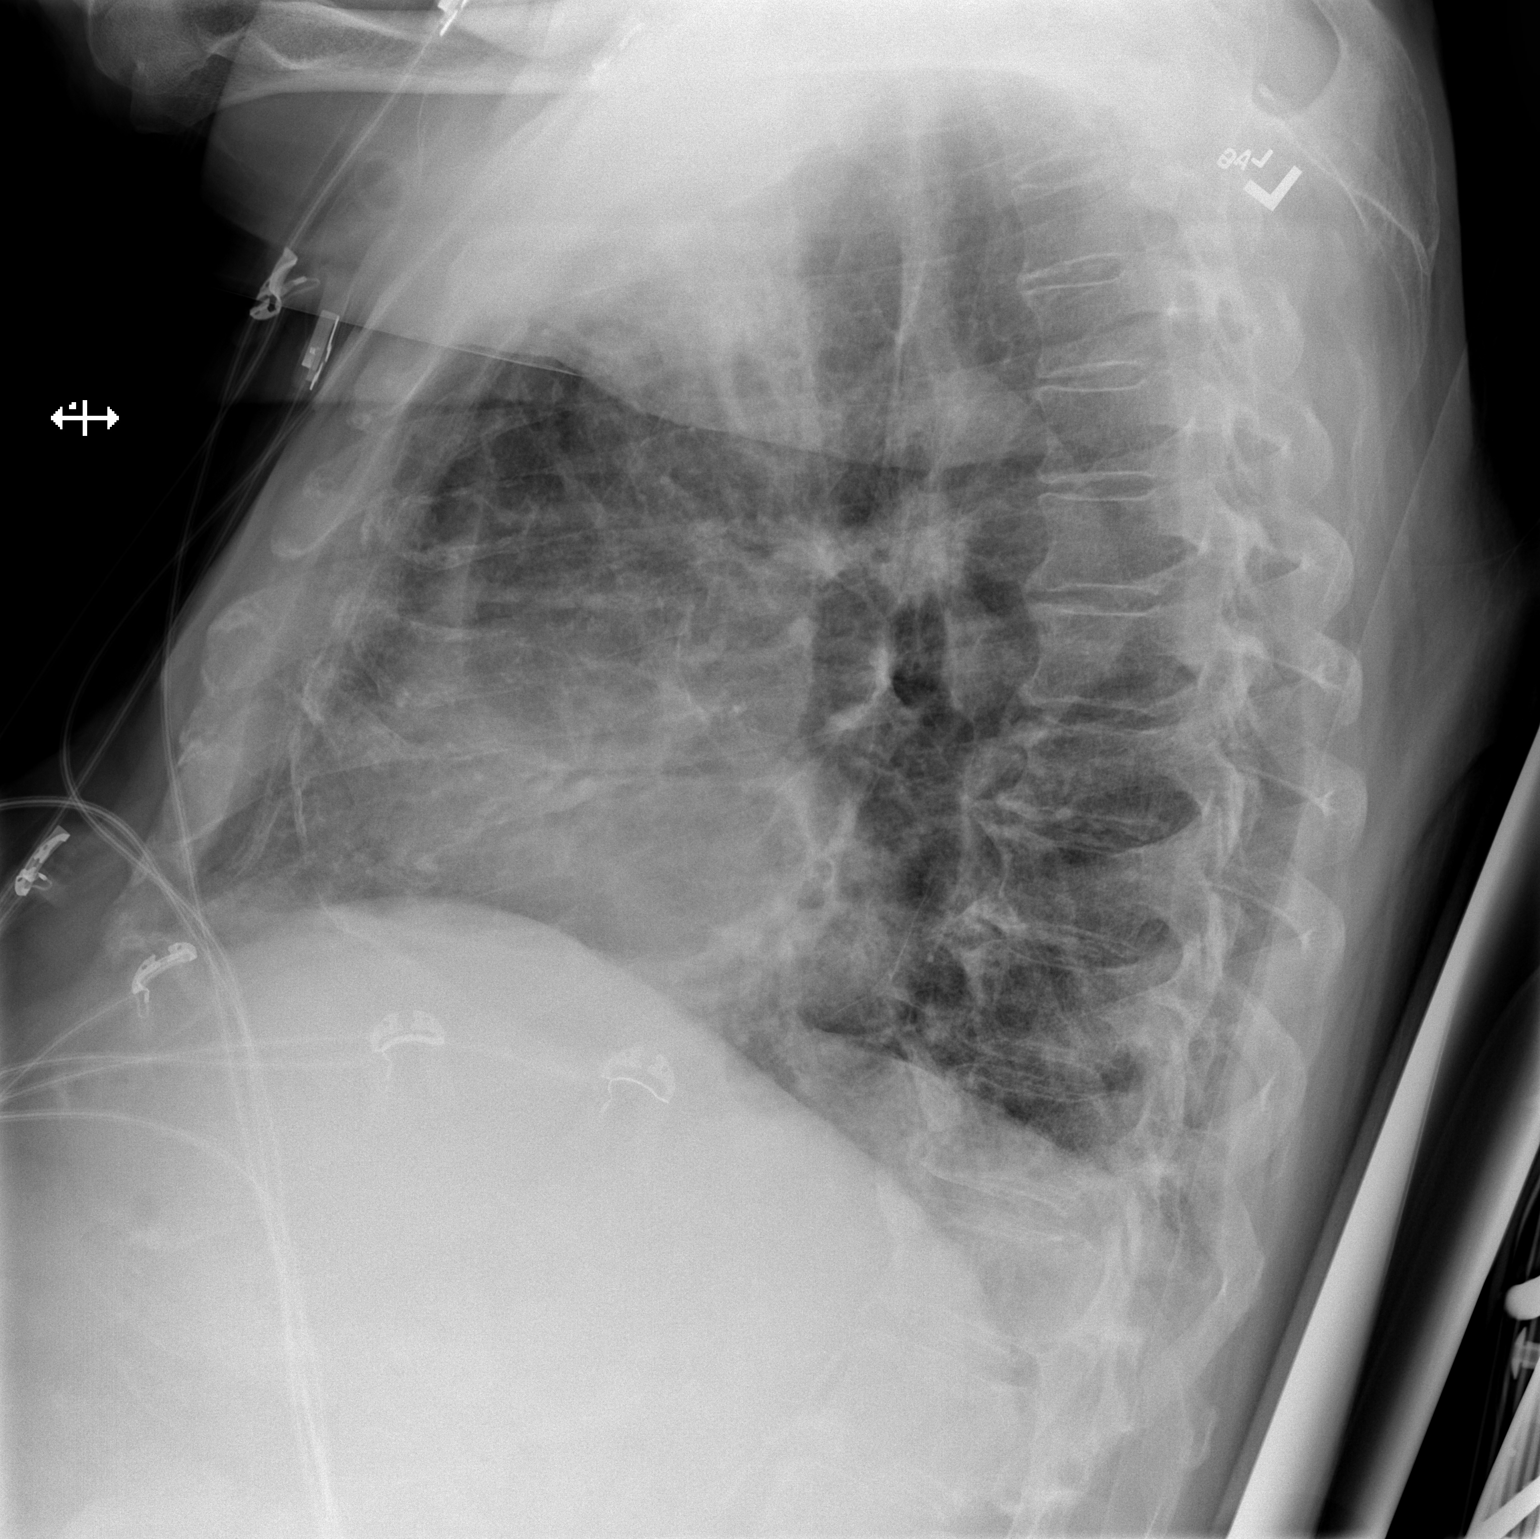

[x chest ap]
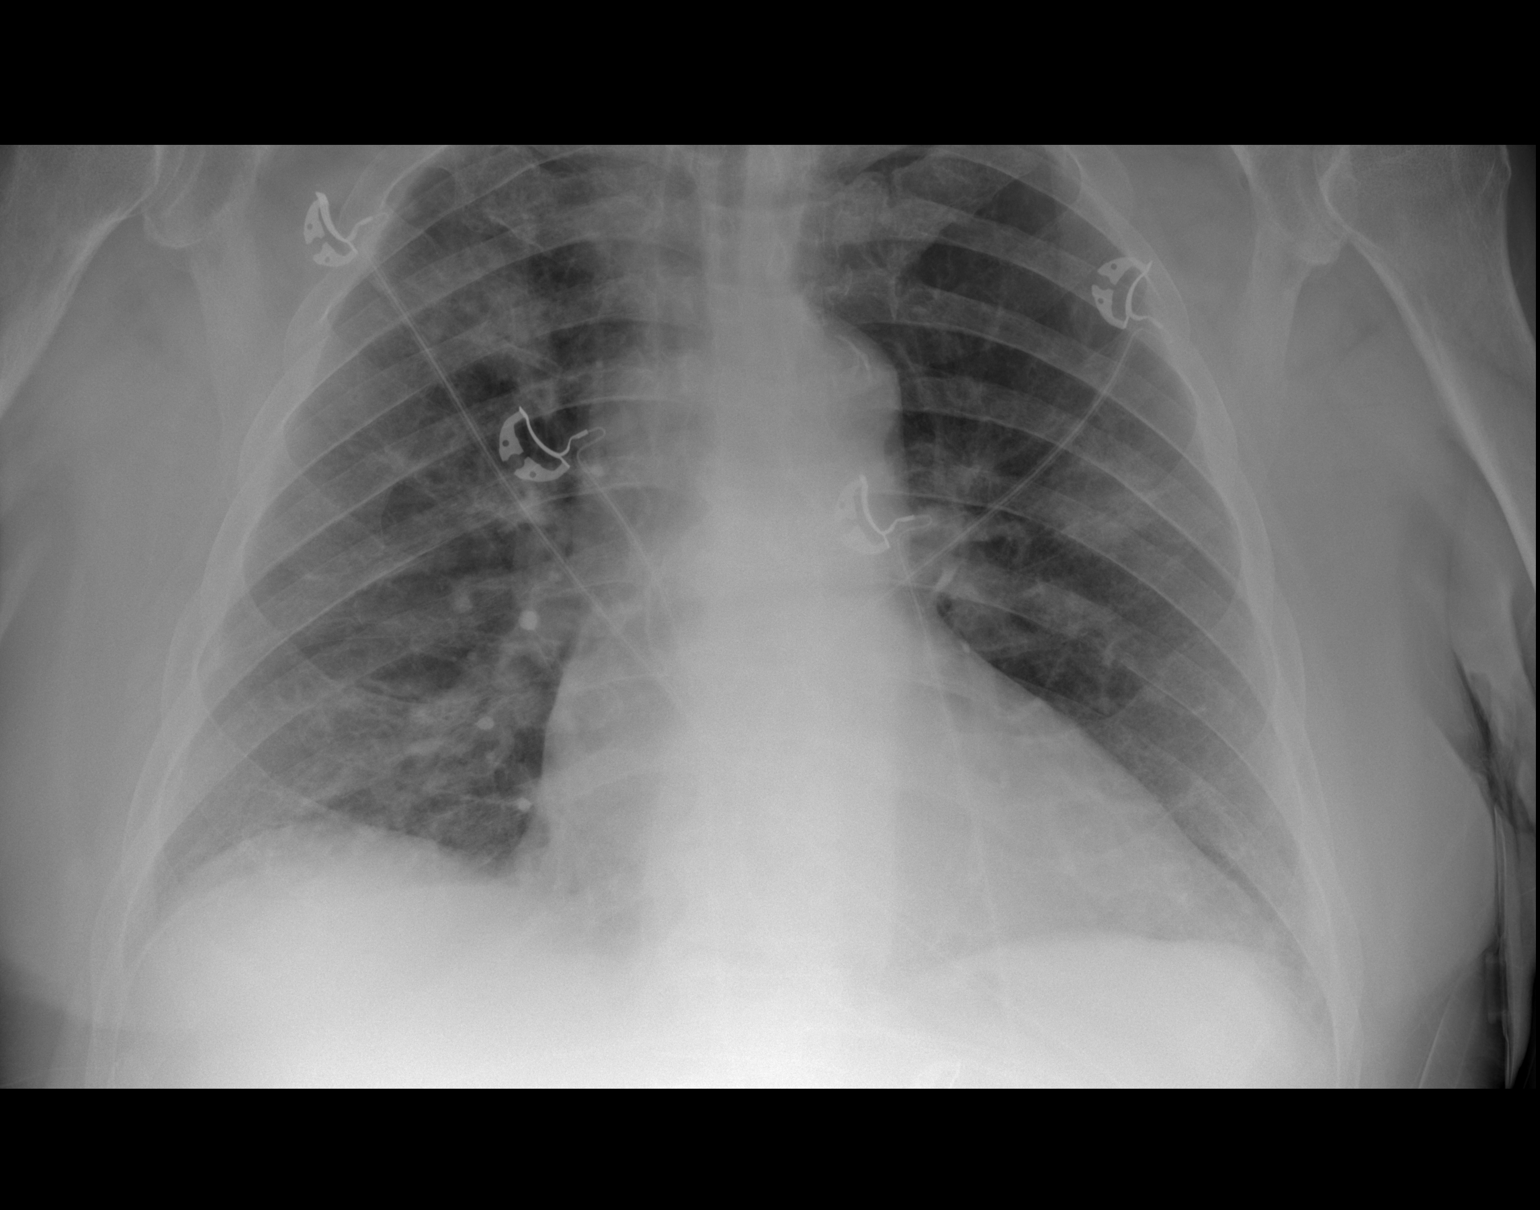

[2 of 2 positions shown; findings below may reference images not displayed]

FINDINGS: Cardiac shadow is within normal limits. There is a rounded nodular
density identified in the left lung measuring 3.3 cm which
corresponds to that seen on the prior exam at which time it measured
1.8 cm. Patchy changes are noted in the right upper lobe also
increased when compared with the prior exam. No sizable effusion is
noted. No bony abnormality is noted.
IMPRESSION: Enlarging bilateral upper lobe nodules as described. This is
consistent with the patient's given clinical history

## 2021-06-01 IMAGING — RF DG ERCP WO/W SPHINCTEROTOMY
1 series · 14 of 20 positions shown · non-contrast
Comparison: MRCP - 04/26/2019

FLUOROSCOPY TIME:  4 minutes, 28 seconds

CLINICAL DATA: ERCP with sphincterotomy and balloon extraction.

EXAM:
ERCP
TECHNIQUE: Multiple spot images obtained with the fluoroscopic device and
submitted for interpretation post-procedure.

[Series 1: run · 14 of 20 slices shown]
[im 1/20]
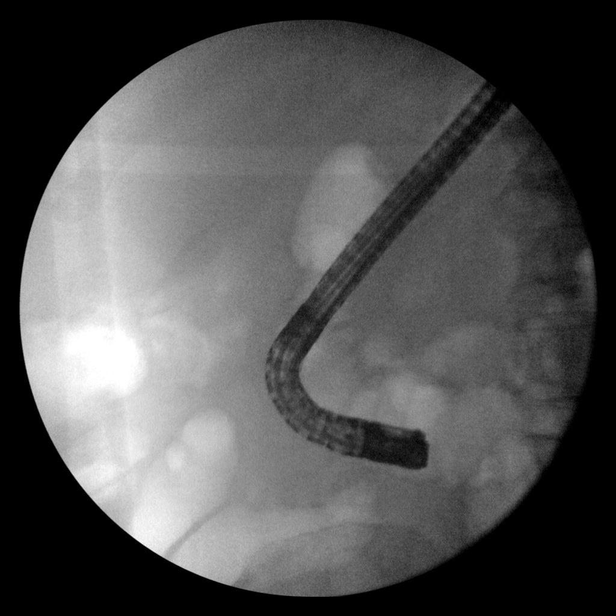
[im 3/20]
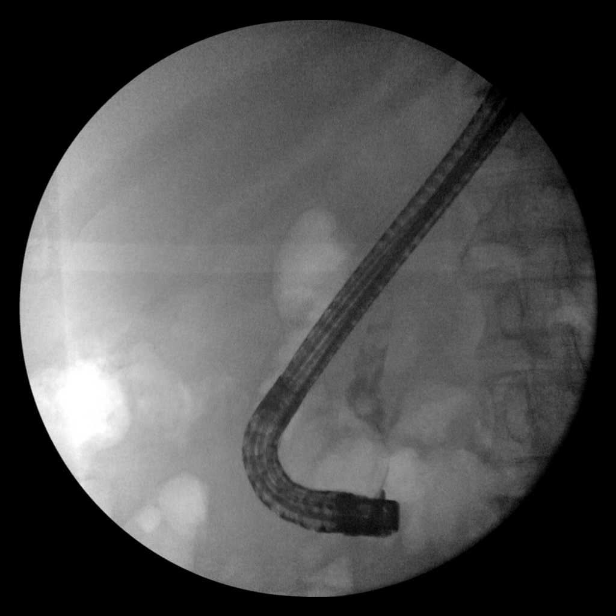
[im 4/20]
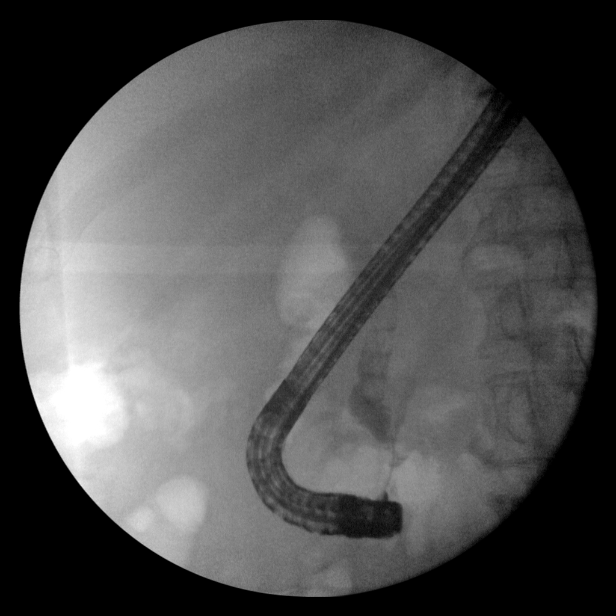
[im 6/20]
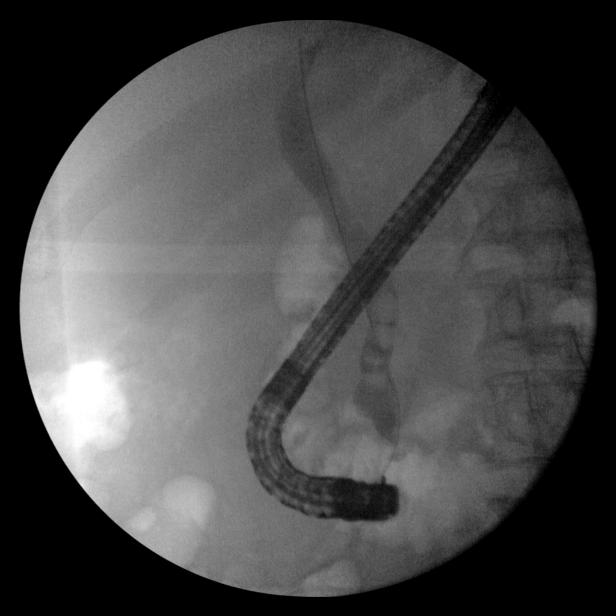
[im 7/20]
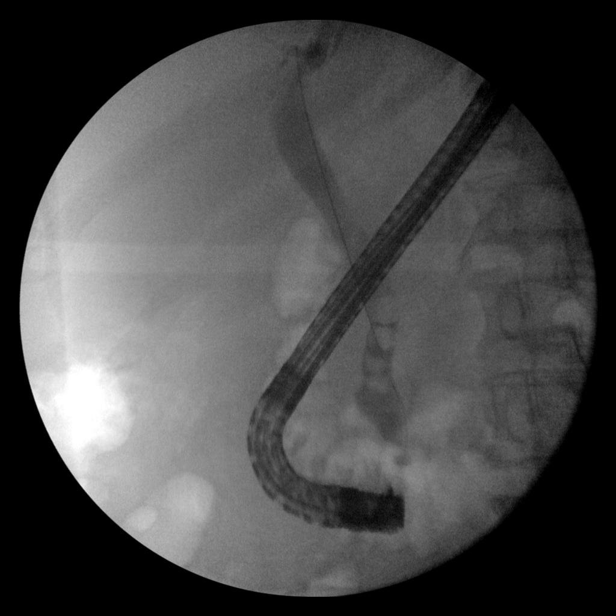
[im 8/20]
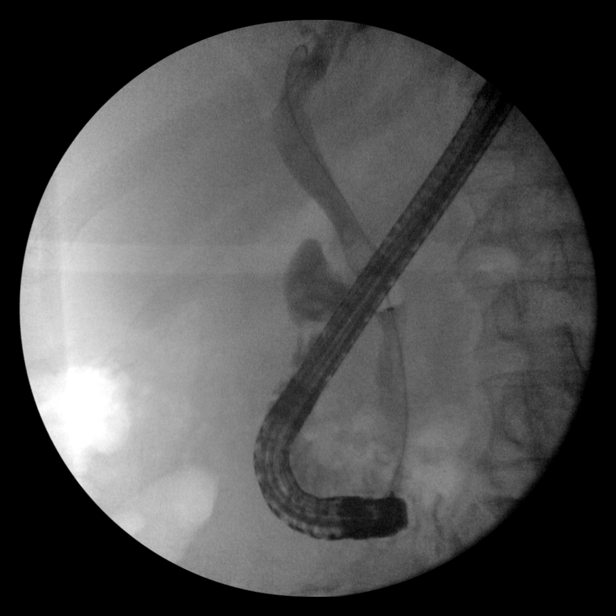
[im 10/20]
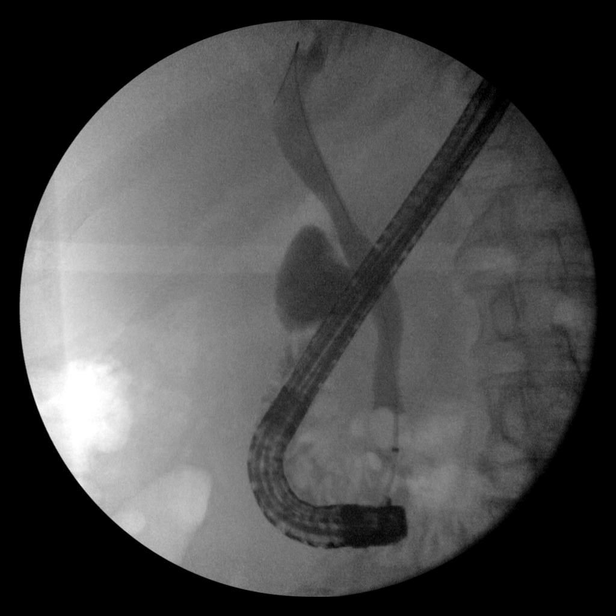
[im 11/20]
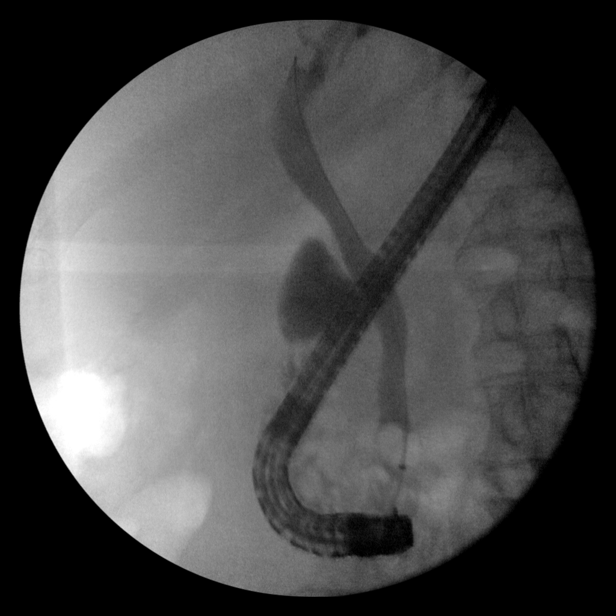
[im 13/20]
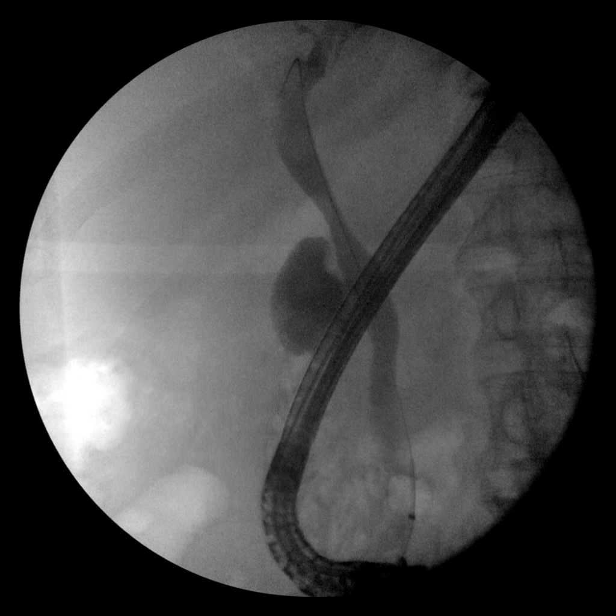
[im 14/20]
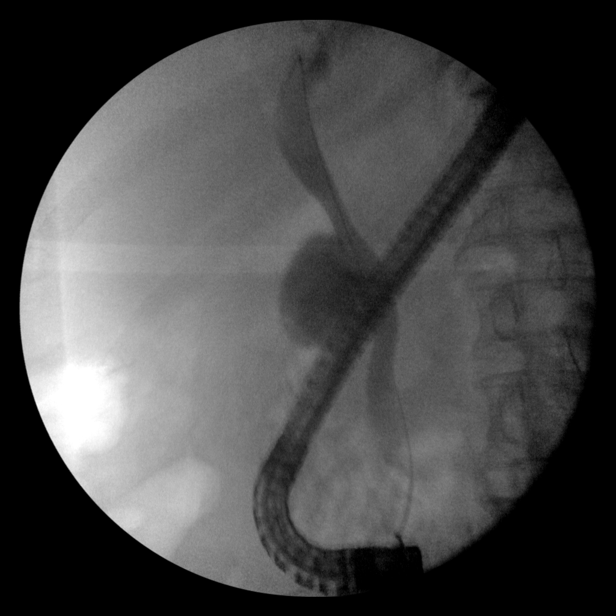
[im 16/20]
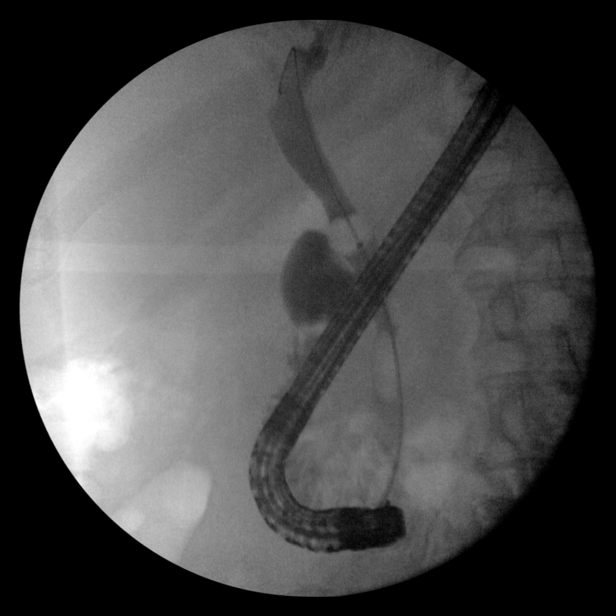
[im 17/20]
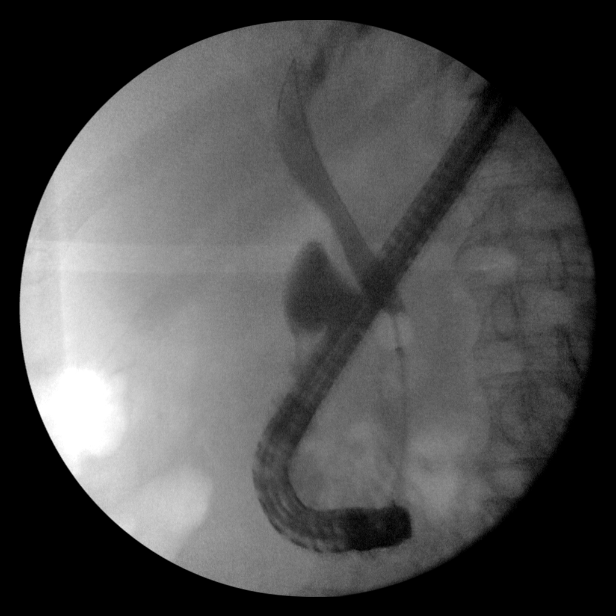
[im 18/20]
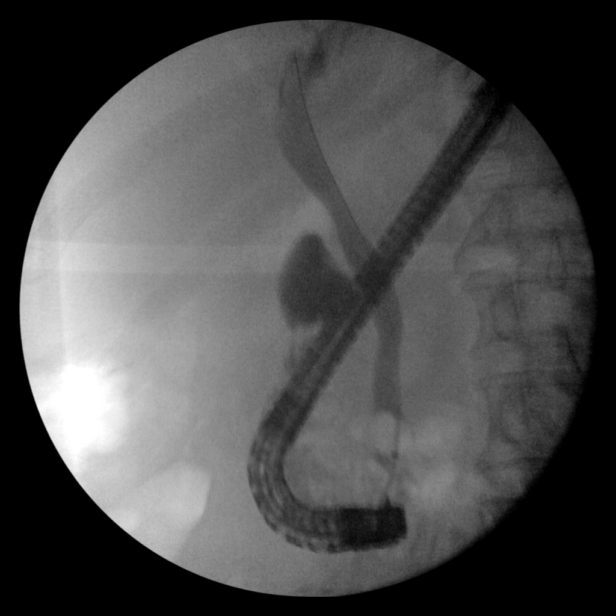
[im 20/20]
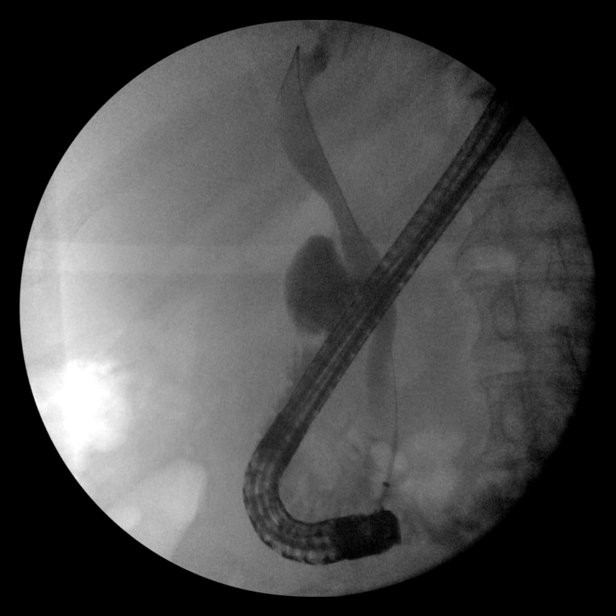

[14 of 20 positions shown; findings below may reference images not displayed]

FINDINGS: Twenty spot intraoperative fluoroscopic images of the right upper
abdominal quadrant during ERCP are provided for review

Initial image demonstrates an ERCP probe overlying the right upper
abdominal quadrant

Subsequent images demonstrate selective cannulation and
opacification of the common bile duct which appears moderately
dilated. There are 4 nonocclusive filling defects within the distal
aspect of the CBD (image 4) compatible with choledocholithiasis
demonstrated on preceding MRCP.

Subsequent images demonstrate insufflation of a balloon within the
central/mid aspect of the CBD with subsequent biliary sweeping and
presumed sphincterotomy.

There is minimal opacification of the intrahepatic biliary tree
which appears nondilated. There is no definitive opacification of
either the cystic or pancreatic ducts.
IMPRESSION: ERCP with findings of choledocholithiasis with subsequent biliary
sweeping and sphincterotomy.

These images were submitted for radiologic interpretation only.
Please see the procedural report for the amount of contrast and the
fluoroscopy time utilized.

## 2021-06-02 IMAGING — DX DG CHEST 1V PORT
1 series · 1 of 1 positions shown · non-contrast
Comparison: Chest radiograph and CT 04/20/2019

CLINICAL DATA: Postop bronchoscopy.

EXAM:
PORTABLE CHEST 1 VIEW

[chest ap]
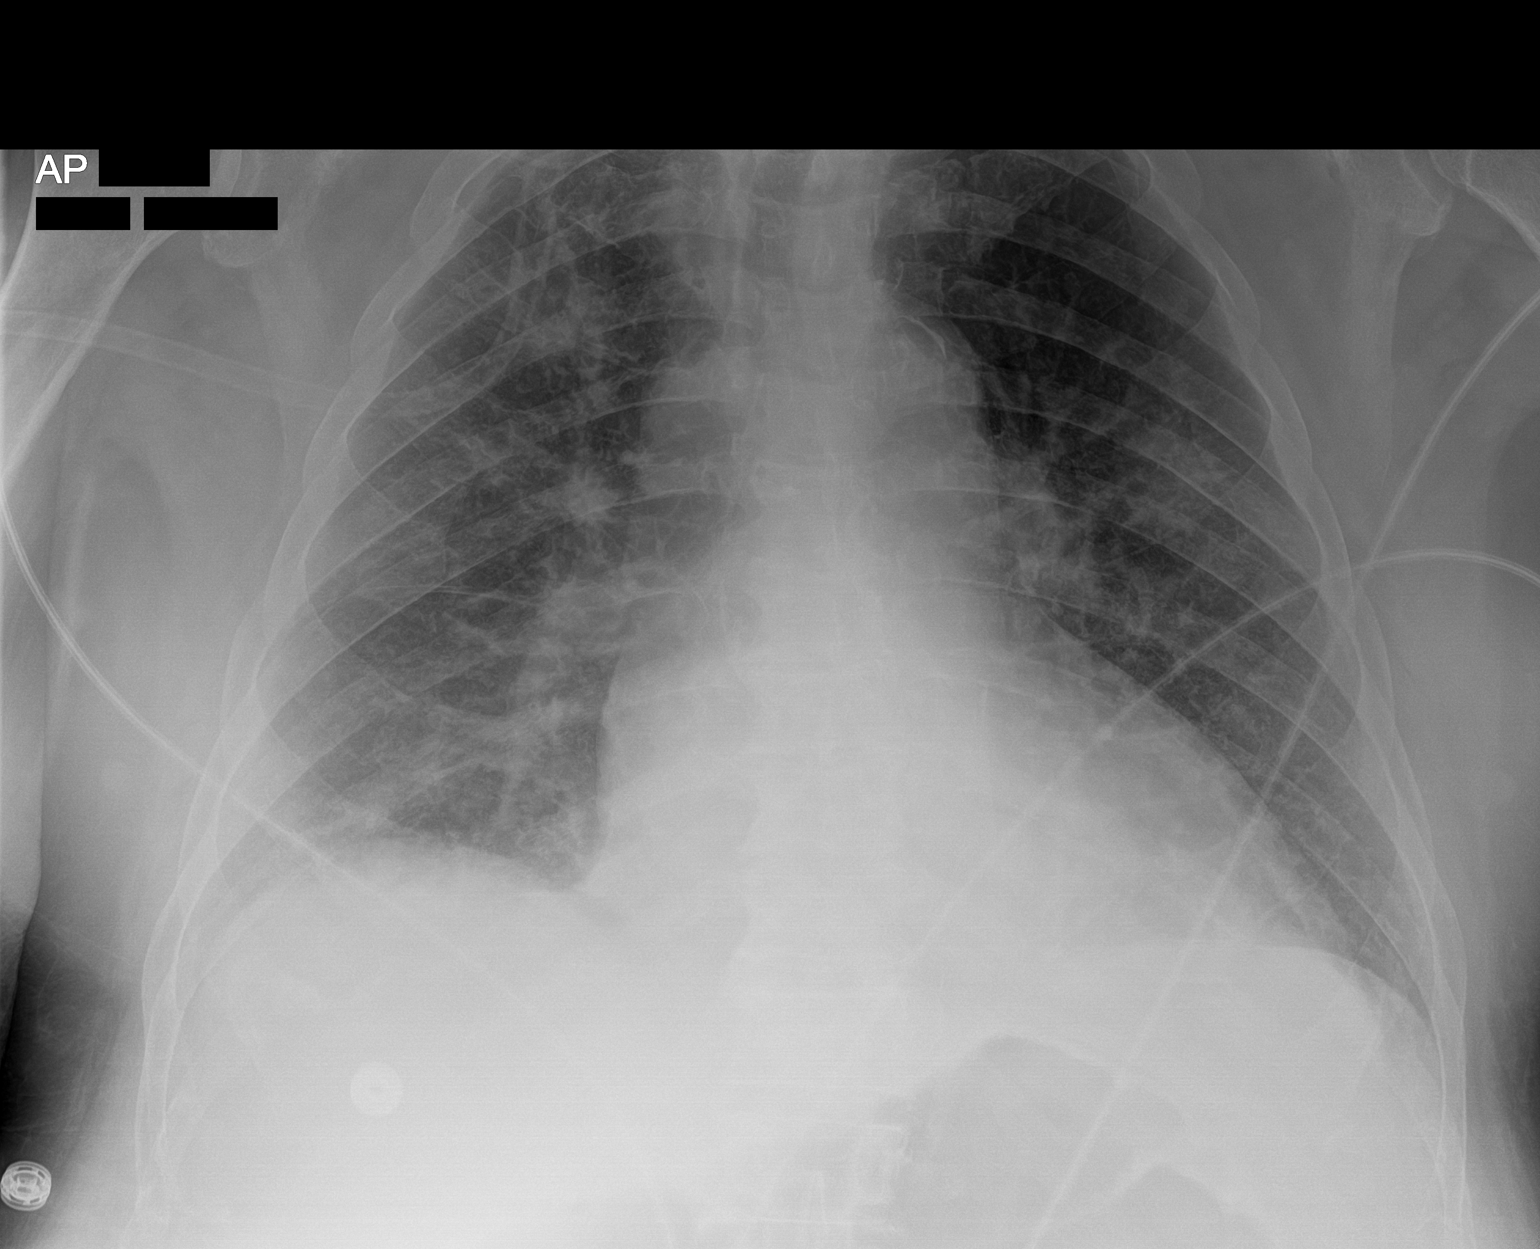

[1 of 1 positions shown; findings below may reference images not displayed]

FINDINGS: No visualized pneumothorax post biopsy. Nodule in the left mid lung
again seen. Nodular opacities in the right suprahilar region
correspond to nodules on CT. Mild cardiomegaly. Unchanged
mediastinal contours with aortic atherosclerosis. No significant
pleural effusion. There is mild peribronchial thickening.
IMPRESSION: 1. No evidence of pneumothorax post biopsy.
2. Left midlung nodule and right suprahilar nodules, as seen on
recent CT.

Aortic Atherosclerosis ([id number]).

## 2021-06-24 IMAGING — PT NM PET TUM IMG INITIAL (PI) SKULL BASE T - THIGH
1 series · 7 of 7 positions shown · non-contrast
Comparison: CT abdomen and pelvis 05/02/2019, MR abdomen of
04/26/2019, CT chest of 04/20/2019.

CLINICAL DATA: Initial treatment strategy for non-small cell lung
cancer.

EXAM:
NUCLEAR MEDICINE PET SKULL BASE TO THIGH
TECHNIQUE: 9.94 mCi F-18 FDG was injected intravenously. Full-ring PET imaging
was performed from the skull base to thigh after the radiotracer. CT
data was obtained and used for attenuation correction and anatomic
localization.
Fasting blood glucose: 138 mg/dl

[Series 1076: results mm oncology reading · 1.0mm · 0.50mm/px · 7 of 7 slices shown]
[im 1/7]
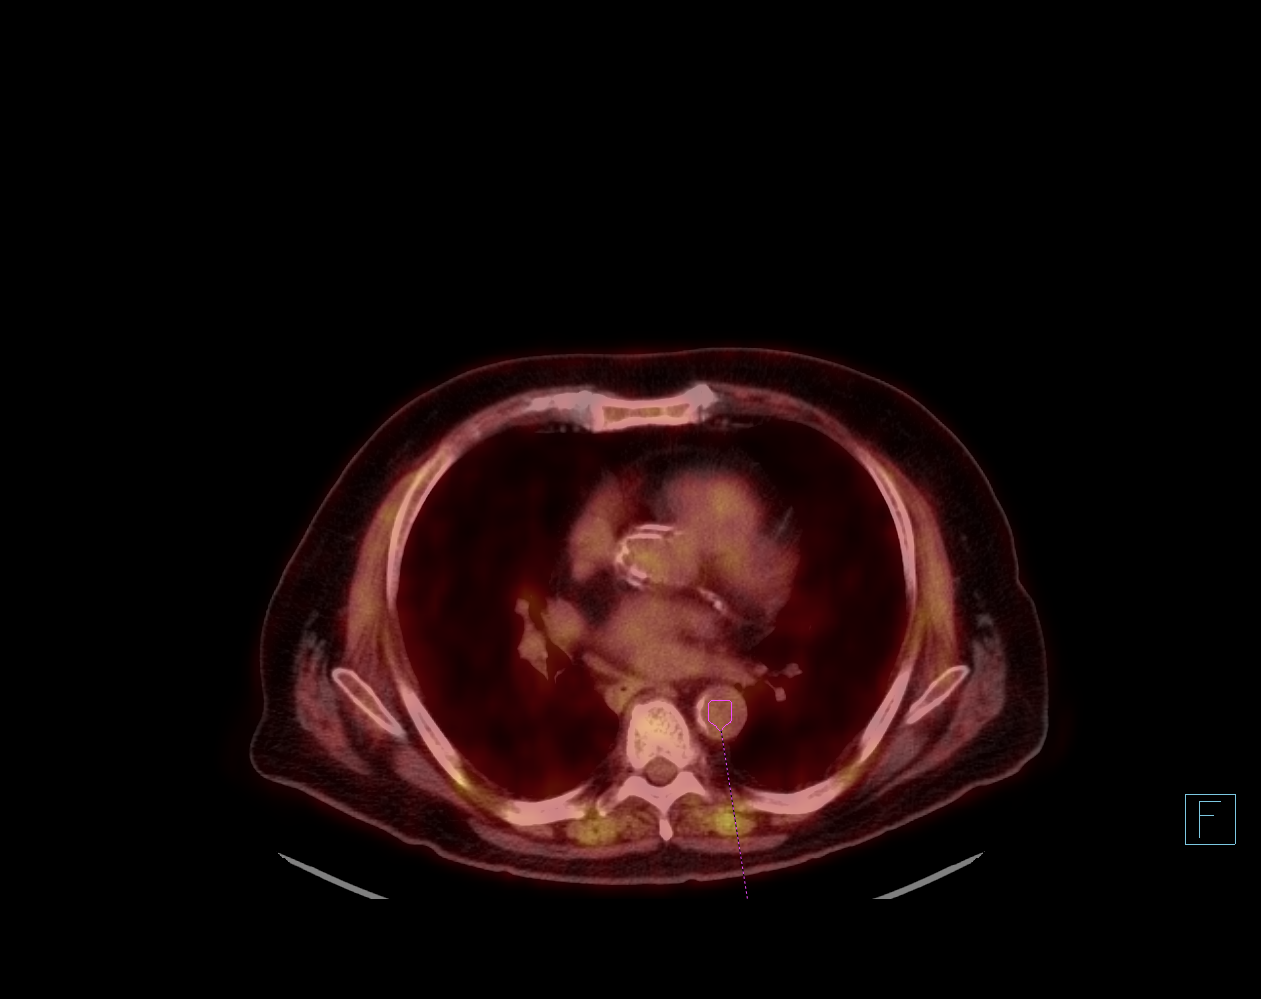
[im 2/7]
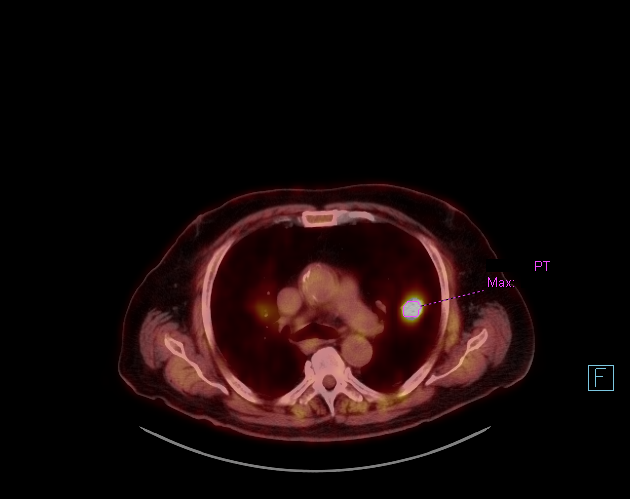
[im 3/7]
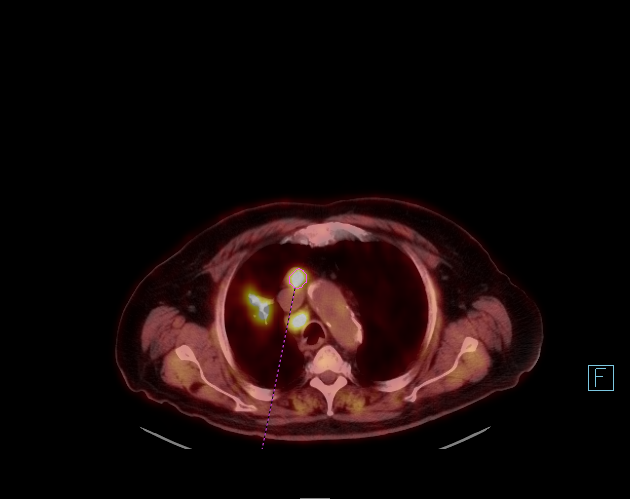
[im 4/7]
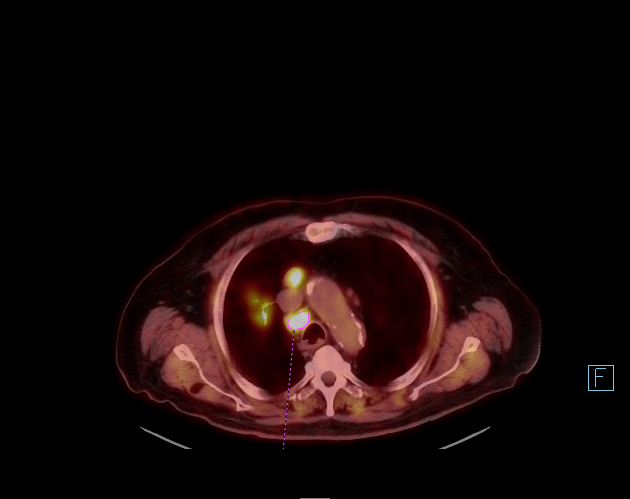
[im 5/7]
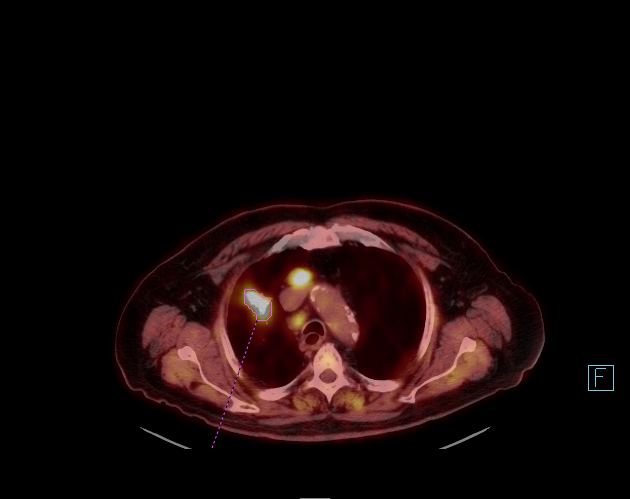
[im 6/7]
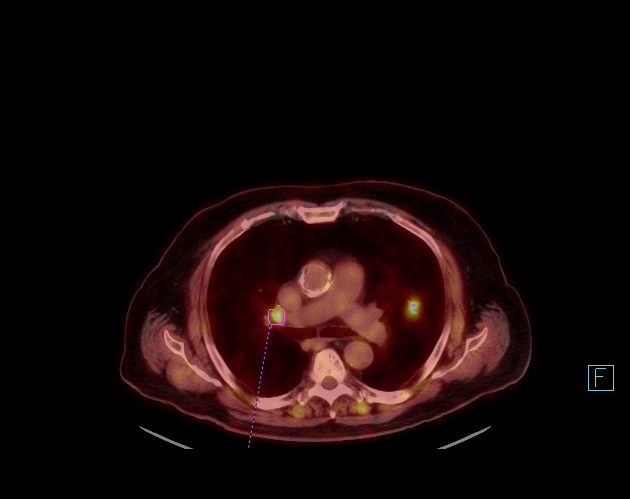
[im 7/7]
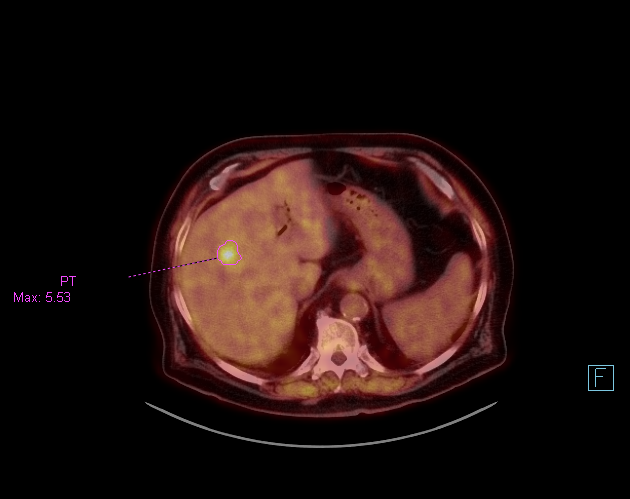

[7 of 7 positions shown; findings below may reference images not displayed]

FINDINGS: Mediastinal blood pool activity: SUV max

Liver activity: SUV max NA

NECK: No hypermetabolic lymph nodes in the neck.

Incidental CT findings: None

CHEST: Nodal disease and parenchymal abnormalities in the chest show
hypermetabolic activity.

(Image 68, series 4): Spiculated left upper lobe mass measuring 3 x
2.5 cm (SUVmax = 14.7)

Anterior mediastinal adenopathy (image 60, series [DATE] cm short
axis dimension (SUVmax = 8.5)

(Image 61, series 4): 16 mm right paratracheal lymph node (SUVmax =
8.6)

Similar size and similar metabolic activity shown in a precarinal
lymph node as well.

Right upper lobe mass with irregular margins (image 19, series [DATE] x 2.3 cm (SUVmax = 8.3)

Mildly hypermetabolic right hilar lymph node less than a cm in size
(SUVmax = 5.1)

Incidental CT findings: Atherosclerotic changes and coronary artery
calcification. Moderate cardiac enlargement. Pulmonary emphysema.

ABDOMEN/PELVIS: Focus of hypermetabolic change in the right hepatic
lobe (image 104, series 4) 15 mm (SUVmax = 5.5)

Tiny focus of increased metabolic activity along the capsule of the
right hemi liver (image 115, series 3, no corresponding CT
abnormality. Only slightly above background hepatic activity.

Adrenal glands are normal.

Incidental CT findings: Pneumobilia. Likely related to recent ERCP.
Spleen, pancreas, adrenal glands and kidneys without signs of acute
abnormality. Cyst in the upper pole of the right kidney. Colonic
diverticulosis without acute bowel process. Normal appendix.
Calcified atherosclerotic changes throughout the abdominal aorta.
Prostate is unremarkable by CT.

SKELETON: No focal hypermetabolic activity to suggest skeletal
metastasis. Extensive muscular uptake.

Incidental CT findings: Spinal degenerative changes. No acute or
destructive bone finding.
IMPRESSION: Bilateral pulmonary masses compatible with known pulmonary neoplasm
and associated with metastatic nodes in the chest and a focus of
metastatic disease to the liver

Second small focus along the right liver margin raising the question
of additional site of metastasis to the liver, attention on
follow-up.

Aortic Atherosclerosis (5Y190-113.3) and Emphysema (5Y190-UG8.L).

## 2021-08-03 ENCOUNTER — Other Ambulatory Visit: Payer: Self-pay | Admitting: Physician Assistant
